# Patient Record
Sex: Female | Born: 1990 | Race: Black or African American | Hispanic: No | Smoking: Never smoker
Health system: Southern US, Community
[De-identification: ages and names within clinical notes are randomized; demographics above are authoritative.]

## PROBLEM LIST (undated history)

## (undated) DIAGNOSIS — F419 Anxiety disorder, unspecified: Secondary | ICD-10-CM

## (undated) DIAGNOSIS — D649 Anemia, unspecified: Secondary | ICD-10-CM

## (undated) DIAGNOSIS — R109 Unspecified abdominal pain: Secondary | ICD-10-CM

## (undated) DIAGNOSIS — E785 Hyperlipidemia, unspecified: Secondary | ICD-10-CM

## (undated) DIAGNOSIS — IMO0002 Reserved for concepts with insufficient information to code with codable children: Secondary | ICD-10-CM

## (undated) DIAGNOSIS — O99345 Other mental disorders complicating the puerperium: Secondary | ICD-10-CM

## (undated) DIAGNOSIS — F53 Postpartum depression: Secondary | ICD-10-CM

## (undated) DIAGNOSIS — Z789 Other specified health status: Secondary | ICD-10-CM

## (undated) DIAGNOSIS — N760 Acute vaginitis: Secondary | ICD-10-CM

## (undated) DIAGNOSIS — B9689 Other specified bacterial agents as the cause of diseases classified elsewhere: Secondary | ICD-10-CM

## (undated) DIAGNOSIS — A64 Unspecified sexually transmitted disease: Secondary | ICD-10-CM

## (undated) HISTORY — DX: Other specified bacterial agents as the cause of diseases classified elsewhere: B96.89

## (undated) HISTORY — DX: Anemia, unspecified: D64.9

## (undated) HISTORY — PX: NO PAST SURGERIES: SHX2092

## (undated) HISTORY — DX: Postpartum depression: F53.0

## (undated) HISTORY — DX: Other mental disorders complicating the puerperium: O99.345

## (undated) HISTORY — DX: Unspecified abdominal pain: R10.9

## (undated) HISTORY — DX: Acute vaginitis: N76.0

## (undated) HISTORY — DX: Other specified health status: Z78.9

## (undated) HISTORY — DX: Reserved for concepts with insufficient information to code with codable children: IMO0002

## (undated) HISTORY — DX: Unspecified sexually transmitted disease: A64

## (undated) HISTORY — DX: Hyperlipidemia, unspecified: E78.5

---

## 2013-11-01 ENCOUNTER — Emergency Department: Payer: Self-pay | Admitting: Emergency Medicine

## 2013-11-01 LAB — CBC
HCT: 33.8 % — AB (ref 35.0–47.0)
HGB: 11.3 g/dL — ABNORMAL LOW (ref 12.0–16.0)
MCH: 28.9 pg (ref 26.0–34.0)
MCHC: 33.3 g/dL (ref 32.0–36.0)
MCV: 87 fL (ref 80–100)
Platelet: 217 10*3/uL (ref 150–440)
RBC: 3.9 10*6/uL (ref 3.80–5.20)
RDW: 12.8 % (ref 11.5–14.5)
WBC: 7.6 10*3/uL (ref 3.6–11.0)

## 2013-11-01 LAB — COMPREHENSIVE METABOLIC PANEL
ALBUMIN: 3.7 g/dL (ref 3.4–5.0)
ALT: 27 U/L (ref 12–78)
Alkaline Phosphatase: 56 U/L
Anion Gap: 4 — ABNORMAL LOW (ref 7–16)
BILIRUBIN TOTAL: 0.4 mg/dL (ref 0.2–1.0)
BUN: 7 mg/dL (ref 7–18)
CALCIUM: 9.1 mg/dL (ref 8.5–10.1)
CREATININE: 0.77 mg/dL (ref 0.60–1.30)
Chloride: 107 mmol/L (ref 98–107)
Co2: 23 mmol/L (ref 21–32)
EGFR (African American): >60
Glucose: 89 mg/dL (ref 65–99)
Osmolality: 266 (ref 275–301)
Potassium: 3.5 mmol/L (ref 3.5–5.1)
SGOT(AST): 26 U/L (ref 15–37)
SODIUM: 134 mmol/L — AB (ref 136–145)
Total Protein: 7.7 g/dL (ref 6.4–8.2)

## 2013-11-01 LAB — URINALYSIS, COMPLETE
BLOOD: NEGATIVE
Bilirubin,UR: NEGATIVE
GLUCOSE, UR: NEGATIVE mg/dL (ref 0–75)
Ketone: NEGATIVE
Leukocyte Esterase: NEGATIVE
Nitrite: NEGATIVE
Ph: 6 (ref 4.5–8.0)
Protein: NEGATIVE
RBC,UR: 8 /HPF (ref 0–5)
SPECIFIC GRAVITY: 1.024 (ref 1.003–1.030)
Squamous Epithelial: 4

## 2013-11-01 LAB — LIPASE, BLOOD: Lipase: 223 U/L (ref 73–393)

## 2013-11-01 LAB — BETA-HYDROXYBUTYRIC ACID: Beta-Hydroxybutyrate: 0.9 mg/dL (ref 0.2–2.8)

## 2013-11-01 LAB — HCG, QUANTITATIVE, PREGNANCY: Beta Hcg, Quant.: 119748 m[IU]/mL — ABNORMAL HIGH

## 2013-11-23 DIAGNOSIS — IMO0002 Reserved for concepts with insufficient information to code with codable children: Secondary | ICD-10-CM

## 2013-11-23 HISTORY — DX: Reserved for concepts with insufficient information to code with codable children: IMO0002

## 2013-11-23 LAB — HM PAP SMEAR: HM PAP: NEGATIVE

## 2014-05-22 ENCOUNTER — Observation Stay: Payer: Self-pay | Admitting: Obstetrics and Gynecology

## 2014-05-22 LAB — CBC WITH DIFFERENTIAL/PLATELET
Basophil #: 0 10*3/uL (ref 0.0–0.1)
Basophil %: 0.5 %
Eosinophil #: 0.2 10*3/uL (ref 0.0–0.7)
Eosinophil %: 2.6 %
HCT: 32.2 % — AB (ref 35.0–47.0)
HGB: 10.3 g/dL — ABNORMAL LOW (ref 12.0–16.0)
LYMPHS ABS: 1.9 10*3/uL (ref 1.0–3.6)
LYMPHS PCT: 24 %
MCH: 26.9 pg (ref 26.0–34.0)
MCHC: 31.9 g/dL — ABNORMAL LOW (ref 32.0–36.0)
MCV: 85 fL (ref 80–100)
MONO ABS: 0.8 x10 3/mm (ref 0.2–0.9)
MONOS PCT: 9.6 %
NEUTROS PCT: 63.3 %
Neutrophil #: 5 10*3/uL (ref 1.4–6.5)
Platelet: 147 10*3/uL — ABNORMAL LOW (ref 150–440)
RBC: 3.81 10*6/uL (ref 3.80–5.20)
RDW: 16.1 % — AB (ref 11.5–14.5)
WBC: 7.9 10*3/uL (ref 3.6–11.0)

## 2014-05-22 LAB — URINALYSIS, COMPLETE
Bilirubin,UR: NEGATIVE
Blood: NEGATIVE
Glucose,UR: NEGATIVE mg/dL (ref 0–75)
Ketone: NEGATIVE
LEUKOCYTE ESTERASE: NEGATIVE
Nitrite: NEGATIVE
Ph: 6 (ref 4.5–8.0)
Protein: NEGATIVE
Specific Gravity: 1.011 (ref 1.003–1.030)
WBC UR: 1 /HPF (ref 0–5)

## 2014-05-31 ENCOUNTER — Inpatient Hospital Stay: Payer: Self-pay | Admitting: Obstetrics and Gynecology

## 2014-05-31 LAB — CBC WITH DIFFERENTIAL/PLATELET
Basophil #: 0 10*3/uL (ref 0.0–0.1)
Basophil %: 0.5 %
EOS ABS: 0.2 10*3/uL (ref 0.0–0.7)
Eosinophil %: 2.7 %
HCT: 36.4 % (ref 35.0–47.0)
HGB: 11.4 g/dL — ABNORMAL LOW (ref 12.0–16.0)
Lymphocyte #: 2.1 10*3/uL (ref 1.0–3.6)
Lymphocyte %: 25.2 %
MCH: 26.8 pg (ref 26.0–34.0)
MCHC: 31.4 g/dL — ABNORMAL LOW (ref 32.0–36.0)
MCV: 85 fL (ref 80–100)
MONO ABS: 0.6 x10 3/mm (ref 0.2–0.9)
MONOS PCT: 7.8 %
Neutrophil #: 5.2 10*3/uL (ref 1.4–6.5)
Neutrophil %: 63.8 %
Platelet: 148 10*3/uL — ABNORMAL LOW (ref 150–440)
RBC: 4.26 10*6/uL (ref 3.80–5.20)
RDW: 16.3 % — AB (ref 11.5–14.5)
WBC: 8.2 10*3/uL (ref 3.6–11.0)

## 2014-06-01 LAB — GC/CHLAMYDIA PROBE AMP

## 2014-06-01 LAB — HEMATOCRIT: HCT: 34.1 % — AB (ref 35.0–47.0)

## 2014-10-02 NOTE — H&P (Signed)
L&D Evaluation:  History:  HPI [age] yoaaf G3P0110, estimated date of confinement 06/05/2013, ega 39.2 weeks admitted in early labor.   Patient's Medical History No Chronic Illness  H/O 24 week fetal loss, s/p 17 OHP Rx.   Patient's Surgical History none   Medications Pre Natal Vitamins  Iron  17 OHP   Allergies NKDA   Social History none   Family History Non-Contributory   ROS:  ROS All systems were reviewed.  HEENT, CNS, GI, GU, Respiratory, CV, Renal and Musculoskeletal systems were found to be normal.   Exam:  Vital Signs stable   Urine Protein not completed   General no apparent distress   Mental Status clear   Heart normal sinus rhythm   Abdomen gravid, non-tender   Estimated Fetal Weight Average for gestational age   Back no CVAT   Edema no edema   Pelvic no external lesions, 4/990/-1/VTX/AROM Clear/IUPC   FHT normal rate with no decels   Skin dry   Lymph no lymphadenopathy   Other O+/ATB-/NR/Rubella NON IMMUNE/VI/HB-/HIV-/GBS-/TSH 0.889/Sickledex-   Impression:  Impression early labor   Plan:  Plan monitor contractions and for cervical change, fluids, Pitocin Augmentation; Epidural PRN   Electronic Signatures: Gaddiel Cullens, Prentice DockerMartin A (MD)  (Signed 07-Jan-16 13:25)  Authored: L&D Evaluation   Last Updated: 07-Jan-16 13:25 by Diyari Cherne, Prentice DockerMartin A (MD)

## 2014-10-02 NOTE — H&P (Signed)
L&D Evaluation:  History:  HPI 38.5 week Intrauterine pregnancy with Lt sided abdominal pain; worse with movement.   Patient's Medical History Preterm delivery history; S/P 17OHP RX   Medications Pre Natal Vitamins   Allergies NKDA   Social History none   Family History Non-Contributory   ROS:  ROS All systems were reviewed.  HEENT, CNS, GI, GU, Respiratory, CV, Renal and Musculoskeletal systems were found to be normal., except for HPI   Exam:  Vital Signs stable  Pulse ox WNL   Urine Protein negative dipstick   General no apparent distress   Mental Status clear   Abdomen Lt sided abdominal wall tenderness; No CVAT   Estimated Fetal Weight Average for gestational age   Back no CVAT   Edema 1+   Pelvic no external lesions, 2/80/-4/VTX   Mebranes Intact   FHT normal rate with no decels   Skin dry   Lymph no lymphadenopathy   Other Irregular contractions   Impression:  Impression 38.5 week Intrauterine pregnancy; Musculoskeletal pain,non acute   Plan:  Plan UA, EFM/NST, monitor contractions and for cervical change   Comments CBC; UA normal   Follow Up Appointment already scheduled   Electronic Signatures: Cameo Shewell, Prentice DockerMartin A (MD)  (Signed 29-Dec-15 07:45)  Authored: L&D Evaluation   Last Updated: 29-Dec-15 07:45 by Myrl Lazarus, Prentice DockerMartin A (MD)

## 2015-01-10 ENCOUNTER — Encounter: Payer: Self-pay | Admitting: Obstetrics and Gynecology

## 2016-02-06 ENCOUNTER — Encounter: Payer: Self-pay | Admitting: Obstetrics and Gynecology

## 2016-04-23 ENCOUNTER — Emergency Department
Admission: EM | Admit: 2016-04-23 | Discharge: 2016-04-23 | Disposition: A | Discharge status: Home / Self Care | Payer: No Typology Code available for payment source | Attending: Emergency Medicine | Admitting: Emergency Medicine

## 2016-04-23 ENCOUNTER — Encounter: Payer: Self-pay | Admitting: *Deleted

## 2016-04-23 DIAGNOSIS — Z3A00 Weeks of gestation of pregnancy not specified: Secondary | ICD-10-CM | POA: Diagnosis not present

## 2016-04-23 DIAGNOSIS — G44319 Acute post-traumatic headache, not intractable: Secondary | ICD-10-CM | POA: Insufficient documentation

## 2016-04-23 DIAGNOSIS — Y999 Unspecified external cause status: Secondary | ICD-10-CM | POA: Diagnosis not present

## 2016-04-23 DIAGNOSIS — Y92410 Unspecified street and highway as the place of occurrence of the external cause: Secondary | ICD-10-CM | POA: Diagnosis not present

## 2016-04-23 DIAGNOSIS — Y9389 Activity, other specified: Secondary | ICD-10-CM | POA: Insufficient documentation

## 2016-04-23 DIAGNOSIS — O9A219 Injury, poisoning and certain other consequences of external causes complicating pregnancy, unspecified trimester: Secondary | ICD-10-CM | POA: Diagnosis not present

## 2016-04-23 MED ORDER — METOCLOPRAMIDE HCL 10 MG PO TABS
5.0000 mg | ORAL_TABLET | Freq: Once | ORAL | Status: AC
  Administered 2016-04-23: 5 mg via ORAL

## 2016-04-23 MED ORDER — METOCLOPRAMIDE HCL 5 MG PO TABS: 5.0000 mg | ORAL_TABLET | Freq: Three times a day (TID) | ORAL | 0 refills | Status: DC | PRN

## 2016-04-23 MED ORDER — ACETAMINOPHEN 325 MG PO TABS
650.0000 mg | ORAL_TABLET | Freq: Once | ORAL | Status: AC
  Administered 2016-04-23: 650 mg via ORAL
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
## 2016-05-07 ENCOUNTER — Encounter: Payer: Self-pay | Admitting: Obstetrics and Gynecology

## 2016-05-07 ENCOUNTER — Ambulatory Visit (INDEPENDENT_AMBULATORY_CARE_PROVIDER_SITE_OTHER): Payer: Medicaid Other | Admitting: Obstetrics and Gynecology

## 2016-05-07 VITALS — BP 101/64 | HR 77 | Ht 67.0 in | Wt 163.5 lb

## 2016-05-07 DIAGNOSIS — A749 Chlamydial infection, unspecified: Secondary | ICD-10-CM

## 2016-05-07 DIAGNOSIS — O98311 Other infections with a predominantly sexual mode of transmission complicating pregnancy, first trimester: Secondary | ICD-10-CM | POA: Diagnosis not present

## 2016-05-07 DIAGNOSIS — O09291 Supervision of pregnancy with other poor reproductive or obstetric history, first trimester: Secondary | ICD-10-CM

## 2016-05-07 DIAGNOSIS — O98811 Other maternal infectious and parasitic diseases complicating pregnancy, first trimester: Secondary | ICD-10-CM

## 2016-05-07 DIAGNOSIS — N912 Amenorrhea, unspecified: Secondary | ICD-10-CM | POA: Diagnosis not present

## 2016-05-07 DIAGNOSIS — R87611 Atypical squamous cells cannot exclude high grade squamous intraepithelial lesion on cytologic smear of cervix (ASC-H): Secondary | ICD-10-CM | POA: Diagnosis not present

## 2016-05-07 DIAGNOSIS — O219 Vomiting of pregnancy, unspecified: Secondary | ICD-10-CM

## 2016-05-07 DIAGNOSIS — Z3687 Encounter for antenatal screening for uncertain dates: Secondary | ICD-10-CM | POA: Diagnosis not present

## 2016-05-07 LAB — POCT URINE PREGNANCY: Preg Test, Ur: POSITIVE — AB

## 2016-05-07 MED ORDER — AZITHROMYCIN 1 G PO PACK: 1.0000 g | PACK | Freq: Once | ORAL | 0 refills | Status: AC

## 2016-05-07 MED ORDER — CONCEPT DHA 53.5-38-1 MG PO CAPS: 53.5000 mg | ORAL_CAPSULE | Freq: Every day | ORAL | 4 refills | Status: DC

## 2016-05-07 MED ORDER — VITAMIN B-6 25 MG PO TABS: 25.0000 mg | ORAL_TABLET | Freq: Three times a day (TID) | ORAL | 1 refills | Status: DC

## 2016-05-07 MED ORDER — DOXYLAMINE SUCCINATE (SLEEP) 25 MG PO TABS: 12.5000 mg | ORAL_TABLET | Freq: Every evening | ORAL | 0 refills | Status: DC | PRN

## 2016-05-08 DIAGNOSIS — O219 Vomiting of pregnancy, unspecified: Secondary | ICD-10-CM | POA: Insufficient documentation

## 2016-05-08 DIAGNOSIS — A749 Chlamydial infection, unspecified: Secondary | ICD-10-CM | POA: Insufficient documentation

## 2016-05-08 DIAGNOSIS — O09291 Supervision of pregnancy with other poor reproductive or obstetric history, first trimester: Secondary | ICD-10-CM | POA: Insufficient documentation

## 2016-05-08 DIAGNOSIS — Z3687 Encounter for antenatal screening for uncertain dates: Secondary | ICD-10-CM | POA: Insufficient documentation

## 2016-05-08 DIAGNOSIS — R87611 Atypical squamous cells cannot exclude high grade squamous intraepithelial lesion on cytologic smear of cervix (ASC-H): Secondary | ICD-10-CM | POA: Insufficient documentation

## 2016-05-08 DIAGNOSIS — O98811 Other maternal infectious and parasitic diseases complicating pregnancy, first trimester: Secondary | ICD-10-CM

## 2016-05-08 NOTE — Progress Notes (Signed)
GYN ENCOUNTER NOTE  Subjective:       Tiffany Bowen is a 25 y.o. [phone] female is here for gynecologic evaluation of the following issues:  1.Pregnancy confirmation 2. Chlamydia  LMP-03/18/2016 (unsure) EDD 12/23/2016 EGA 7.[redacted] weeks gestation  Patient presents with amenorrhea and a positive urine pregnancy test. She has been experiencing mild nausea with vomiting. She is not having any pelvic pain or vaginal bleeding. She is unclear as to the exact date of her last menstrual period. Patient had [facility] which [facility]; both she and her partner require treatment.  Other prenatal risk factors  1. History of 24 week loss; last successful pregnancy treated with 17 OHP  2. ASCUS/H Pap smear  3. History of postpartum depression   Gynecologic History Patient's last menstrual period was 03/18/2016. Contraception: none Last Pap: ASCUS/H 12/20/2015 Last mammogram: N/A  Obstetric History OB History  Gravida Para Term Preterm AB Living  4 2 1 1 1 1   SAB TAB Ectopic Multiple Live Births  1       1    # Outcome Date GA Lbr Len/2nd Weight Sex Delivery Anes PTL Lv  4 Current           3 Term 05/2014   7 lb 8 oz (3.402 kg) M Vag-Spont   LIV  2 Preterm 2014    M Vag-Spont   FD  1 SAB         ND      Past Medical History:  Diagnosis Date  . Abdominal pain   . Anemia   . Breastfeeding (infant)   . BV (bacterial vaginosis)   . 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 Post partum depression   . STD (sexually transmitted disease)     Past Surgical History:  Procedure Laterality Date  . NO PAST SURGERIES      No current outpatient prescriptions on file prior to visit.   No current facility-administered medications on file prior to visit.     No Known Allergies  Social History   Social History  . Marital status: Single    Spouse name: N/A  . Number of children: N/A  . Years of education: N/A    Occupational History  . Not on file.   Social History Main Topics  . Smoking status: Never Smoker  . Smokeless tobacco: Never Used  . Alcohol use No  . Drug use: No  . Sexual activity: Yes    Birth control/ protection: None   Other Topics Concern  . Not on file   Social History Narrative  . No narrative on file    Family History  Problem Relation Age of Onset  . Cancer Neg Hx   . Diabetes Neg Hx   . Heart disease Neg Hx     The following portions of the patient's history were reviewed and updated as appropriate: allergies, current medications, past family history, past medical history, past social history, past surgical history and problem list.  Review of Systems Review of Systems - per history of present illness  Objective:   BP 101/64   Pulse 77   Ht 5\' 7"  (1.702 m)   Wt 163 lb 8 oz (74.2 kg)   LMP 03/18/2016   Breastfeeding? No   BMI 25.61 kg/m  CONSTITUTIONAL: Well-developed, well-nourished female in no acute distress.  Physical exam-deferred   Assessment:   1. Amenorrhea - POCT urine pregnancy  2. Unsure of LMP (last menstrual period) as reason for ultrasound scan - US OB Transvaginal; Future - US OB Comp Less 14 Wks; Future  3. Chlamydia infection affecting pregnancy in first trimester  4. Nausea and vomiting during pregnancy  5. History of postpartum depression   6. History of high-grade dysplasia in 2015; current Pap smear ASCUS/H  7. History of 24 week fetal loss; last successful pregnancy treated with 17 OHP   Plan:   1. Zithromax 1 g by mouth; partner needs treatment 2. Vitamin B6 and doxylamine is prescribed for nausea and vomiting in pregnancy 3. Pelvic ultrasound is ordered because of unsure last menstrual period 4. Patient will need 17 OHP for history of prior 24 week loss 5. Patient will need evaluation for ASCUS/H Pap smear 6. Return in 3 weeks for nursing intake 7. Return in 4 weeks for new OB history and physical  A  total of 25 minutes were spent face-to-face with the patient during this encounter and over half of that time involved counseling and coordination of care.  Herold HarmsMartin A Defrancesco, MD  Note: This dictation was prepared with Dragon dictation along with smaller phrase technology. Any transcriptional errors that result from this process are unintentional.

## 2016-05-08 NOTE — Patient Instructions (Addendum)
1. Zithromax 1 g by mouth; partner needs treatment 2. Vitamin B6 and doxylamine is prescribed for nausea and vomiting in pregnancy 3. Pelvic ultrasound is ordered because of unsure last menstrual period 4. Patient will need 17 OHP for history of prior 24 week loss 5. Patient will need evaluation for ASCUS/H Pap smear 6. Return in 2 weeks for nursing intake 7. Return in 4 weeks for new OB history and physical

## 2016-05-11 ENCOUNTER — Telehealth: Payer: Self-pay | Admitting: Obstetrics and Gynecology

## 2016-05-11 NOTE — Telephone Encounter (Signed)
Pt said Dr Tommi Rumpse gave her an arithamycin power pack? She drank it but threw it up and she wants to know what she needs to do.

## 2016-05-12 NOTE — Telephone Encounter (Signed)
Pls see below and advise

## 2016-05-13 MED ORDER — AZITHROMYCIN 1 G PO PACK: 1.0000 g | PACK | Freq: Once | ORAL | 0 refills | Status: AC

## 2016-05-13 NOTE — Telephone Encounter (Signed)
lmtrc

## 2016-05-13 NOTE — Telephone Encounter (Signed)
Pt aware per mad to resend zithromax powder. Pt aware if she is unable to keep it down to call me back and I will send in tablets.

## 2016-05-13 NOTE — Addendum Note (Signed)
Addended by: Marchelle FolksMILLER, Alva Kuenzel G on: 05/13/2016 01:54 PM   Modules accepted: Orders

## 2016-05-14 ENCOUNTER — Ambulatory Visit (INDEPENDENT_AMBULATORY_CARE_PROVIDER_SITE_OTHER): Payer: Medicaid Other

## 2016-05-14 DIAGNOSIS — Z3687 Encounter for antenatal screening for uncertain dates: Secondary | ICD-10-CM | POA: Diagnosis not present

## 2016-06-04 ENCOUNTER — Encounter: Payer: Medicaid Other | Admitting: Certified Nurse Midwife

## 2017-12-22 ENCOUNTER — Emergency Department: Payer: 59

## 2017-12-22 ENCOUNTER — Emergency Department
Admission: EM | Admit: 2017-12-22 | Discharge: 2017-12-22 | Disposition: A | Discharge status: Home / Self Care | Payer: 59 | Attending: Emergency Medicine | Admitting: Emergency Medicine

## 2017-12-22 ENCOUNTER — Encounter: Payer: Self-pay | Admitting: Emergency Medicine

## 2017-12-22 ENCOUNTER — Other Ambulatory Visit: Payer: Self-pay

## 2017-12-22 DIAGNOSIS — T71194A Asphyxiation due to mechanical threat to breathing due to other causes, undetermined, initial encounter: Secondary | ICD-10-CM | POA: Diagnosis present

## 2017-12-22 DIAGNOSIS — Z79899 Other long term (current) drug therapy: Secondary | ICD-10-CM | POA: Diagnosis not present

## 2017-12-22 LAB — BASIC METABOLIC PANEL
Anion gap: 6 (ref 5–15)
BUN: 10 mg/dL (ref 6–20)
CALCIUM: 9 mg/dL (ref 8.9–10.3)
CO2: 28 mmol/L (ref 22–32)
CREATININE: 0.75 mg/dL (ref 0.44–1.00)
Chloride: 106 mmol/L (ref 98–111)
GFR calc non Af Amer: >60 mL/min (ref >60)
Glucose, Bld: 91 mg/dL (ref 70–99)
Potassium: 3.9 mmol/L (ref 3.5–5.1)
SODIUM: 140 mmol/L (ref 135–145)

## 2017-12-22 LAB — CBC WITH DIFFERENTIAL/PLATELET
BASOS PCT: 1 %
Basophils Absolute: 0 10*3/uL (ref 0–0.1)
EOS ABS: 0.1 10*3/uL (ref 0–0.7)
Eosinophils Relative: 2 %
HCT: 36.1 % (ref 35.0–47.0)
Hemoglobin: 12 g/dL (ref 12.0–16.0)
Lymphocytes Relative: 35 %
Lymphs Abs: 1.6 10*3/uL (ref 1.0–3.6)
MCH: 29.4 pg (ref 26.0–34.0)
MCHC: 33.2 g/dL (ref 32.0–36.0)
MCV: 88.5 fL (ref 80.0–100.0)
MONO ABS: 0.4 10*3/uL (ref 0.2–0.9)
MONOS PCT: 8 %
NEUTROS ABS: 2.5 10*3/uL (ref 1.4–6.5)
NEUTROS PCT: 54 %
Platelets: 213 10*3/uL (ref 150–440)
RBC: 4.08 MIL/uL (ref 3.80–5.20)
RDW: 12.8 % (ref 11.5–14.5)
WBC: 4.6 10*3/uL (ref 3.6–11.0)

## 2017-12-22 LAB — POCT PREGNANCY, URINE: PREG TEST UR: NEGATIVE

## 2017-12-22 MED ORDER — IOPAMIDOL (ISOVUE-370) INJECTION 76%
75.0000 mL | Freq: Once | INTRAVENOUS | Status: AC | PRN
  Administered 2017-12-22: 75 mL via INTRAVENOUS
  Filled 2017-12-22: qty 75

## 2017-12-22 MED ORDER — IBUPROFEN 600 MG PO TABS
ORAL_TABLET | ORAL | Status: AC
  Administered 2017-12-22: 600 mg via ORAL
  Filled 2017-12-22: qty 1

## 2017-12-22 MED ORDER — IBUPROFEN 400 MG PO TABS
600.0000 mg | ORAL_TABLET | Freq: Once | ORAL | Status: AC
  Administered 2017-12-22: 600 mg via ORAL
  Filled 2017-12-22: qty 2
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
## 2017-12-22 NOTE — ED Notes (Signed)
MD at bedside with patient and mother to explain test results.
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
## 2018-01-26 ENCOUNTER — Encounter: Payer: Self-pay | Admitting: Obstetrics and Gynecology

## 2018-01-26 ENCOUNTER — Other Ambulatory Visit (HOSPITAL_COMMUNITY)
Admission: RE | Admit: 2018-01-26 | Discharge: 2018-01-26 | Disposition: A | Discharge status: Home / Self Care | Payer: 59 | Source: Ambulatory Visit | Attending: Obstetrics and Gynecology | Admitting: Obstetrics and Gynecology

## 2018-01-26 ENCOUNTER — Ambulatory Visit (INDEPENDENT_AMBULATORY_CARE_PROVIDER_SITE_OTHER): Payer: 59 | Admitting: Obstetrics and Gynecology

## 2018-01-26 VITALS — BP 107/74 | HR 75 | Ht 66.0 in | Wt 156.6 lb

## 2018-01-26 DIAGNOSIS — Z202 Contact with and (suspected) exposure to infections with a predominantly sexual mode of transmission: Secondary | ICD-10-CM | POA: Diagnosis not present

## 2018-01-26 DIAGNOSIS — Z01419 Encounter for gynecological examination (general) (routine) without abnormal findings: Secondary | ICD-10-CM

## 2018-01-26 DIAGNOSIS — N939 Abnormal uterine and vaginal bleeding, unspecified: Secondary | ICD-10-CM

## 2018-01-26 DIAGNOSIS — N898 Other specified noninflammatory disorders of vagina: Secondary | ICD-10-CM | POA: Diagnosis not present

## 2018-01-26 LAB — POCT URINE PREGNANCY: Preg Test, Ur: NEGATIVE

## 2018-01-26 NOTE — Progress Notes (Signed)
ANNUAL PREVENTATIVE CARE GYN  ENCOUNTER NOTE  Subjective:       Tiffany Bowen is a 27 y.o. [phone] female here for a routine annual gynecologic exam.  Current complaints: 1.  Irregular cycles; bleeding is monthly but varies from being light to heavy; she has no intermenstrual bleeding.  2. Discuss BTL; patient uncertain about future pregnancy; compliance with daily medication is lacking per patient's history; she is contemplating IUD 3.  Desires STD screening; patient is concerned about her partner and has had increased discharge recently; no vaginal burning, vaginal pain, painful sex. 4.  Patient has history of ASCUS/H Pap smear, never fully evaluated.   Gynecologic History Patient's last menstrual period was 01/10/2018 (exact date). Contraception: none Last Pap: 11/2015 at [facility] ascus cannot exclude high grade. Unsure if had colpo.  Results were: abn Last mammogram: n/a   Obstetric History OB History  Gravida Para Term Preterm AB Living  4 2 1 1 2 1   SAB TAB Ectopic Multiple Live Births  1 1     1     # Outcome Date GA Lbr Len/2nd Weight Sex Delivery Anes PTL Lv  4 TAB 2017          3 Term 05/2014   7 lb 8 oz (3.402 kg) M Vag-Spont   LIV  2 Preterm 2014    M Vag-Spont   FD  1 SAB 2012        ND    Past Medical History:  Diagnosis Date  . Abdominal pain   . Anemia   . Breastfeeding (infant)   . BV (bacterial vaginosis)   . 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 Post partum depression   . STD (sexually transmitted disease)     Past Surgical History:  Procedure Laterality Date  . NO PAST SURGERIES      No current outpatient medications on file prior to visit.   No current facility-administered medications on file prior to visit.     No Known Allergies  Social History   Socioeconomic History  . Marital status: Single    Spouse name: Not on file  . Number of children: Not on file  . Years of education: Not on file   . Highest education level: Not on file  Occupational History  . Not on file  Social Needs  . Financial resource strain: Not on file  . Food insecurity:    Worry: Not on file    Inability: Not on file  . Transportation needs:    Medical: Not on file    Non-medical: Not on file  Tobacco Use  . Smoking status: Never Smoker  . Smokeless tobacco: Never Used  Substance and Sexual Activity  . Alcohol use: No  . Drug use: No  . Sexual activity: Yes    Birth control/protection: None  Lifestyle  . Physical activity:    Days per week: Not on file    Minutes per session: Not on file  . Stress: Not on file  Relationships  . Social connections:    Talks on phone: Not on file    Gets together: Not on file    Attends religious service: Not on file    Active member of club or organization: Not on file    Attends meetings of clubs or organizations: Not on file    Relationship status: Not on file  . Intimate partner violence:    Fear of current or  ex partner: Not on file    Emotionally abused: Not on file    Physically abused: Not on file    Forced sexual activity: Not on file  Other Topics Concern  . Not on file  Social History Narrative  . Not on file    Family History  Problem Relation Age of Onset  . Cancer Neg Hx   . Diabetes Neg Hx   . Heart disease Neg Hx     The following portions of the patient's history were reviewed and updated as appropriate: allergies, current medications, past family history, past medical history, past social history, past surgical history and problem list.  Review of Systems Review of Systems  Constitutional: Positive for fever. Negative for chills and diaphoresis.  HENT: Negative.   Eyes: Negative.   Respiratory: Negative.   Cardiovascular: Negative.   Gastrointestinal: Negative.   Genitourinary:       Irregular menstrual cycles  Musculoskeletal: Negative.   Skin: Negative.   Neurological: Negative.   Endo/Heme/Allergies: Negative.    Psychiatric/Behavioral: Negative.     Objective:   BP 107/74   Pulse 75   Ht 5\' 6"  (1.676 m)   Wt 156 lb 9.6 oz (71 kg)   LMP 01/10/2018 (Exact Date)   Breastfeeding? No   BMI 25.28 kg/m  CONSTITUTIONAL: Well-developed, well-nourished female in no acute distress.  PSYCHIATRIC: Normal mood and affect. Normal behavior. Normal judgment and thought content. NEUROLGIC: Alert and oriented to person, place, and time. Normal muscle tone coordination. No cranial nerve deficit noted. HENT:  Normocephalic, atraumatic, External right and left ear normal.  EYES: Conjunctivae and EOM are normal. No scleral icterus.  NECK: Normal range of motion, supple, no masses.  Normal thyroid.  SKIN: Skin is warm and dry. No rash noted. Not diaphoretic. No erythema. No pallor. CARDIOVASCULAR: Normal heart rate noted, regular rhythm, no murmur. RESPIRATORY: Clear to auscultation bilaterally. Effort and breath sounds normal, no problems with respiration noted. BREASTS: Symmetric in size. No masses, skin changes, nipple drainage, or lymphadenopathy. ABDOMEN: Soft, normal bowel sounds, no distention noted.  No tenderness, rebound or guarding.  BLADDER: Normal PELVIC:  External Genitalia: Normal  BUS: Normal  Vagina: Normal estrogen effect; minimal white frothy secretions  Cervix: Normal; no discharge; no cervical motion tenderness  Uterus: Normal; anteverted, mobile, top normal size and shape  Adnexa: Normal; nonpalpable nontender  RV: External Exam NormaI  MUSCULOSKELETAL: Normal range of motion. No tenderness.  No cyanosis, clubbing, or edema.  2+ distal pulses. LYMPHATIC: No Axillary, Supraclavicular, or Inguinal Adenopathy.  PROCEDURE: Wet prep Normal saline-moderate white blood cells; no clue cells; no trichomonas KOH-no yeast  Assessment:   Annual gynecologic examination 27 y.o. Contraception: abstinence Normal BMI STD exposure Vaginal discharge; wet prep consistent with leukorrhea History of  ASCUS H Pap smear  Plan:  Pap: Pap, Reflex if ASCUS Mammogram: Not Indicated Stool Guaiac Testing:  Not Indicated Labs: thru employer STD testing is performed Routine preventative health maintenance measures emphasized: Exercise/Diet/Weight control, Tobacco Warnings, Alcohol/Substance use risks and Safe Sex Return in 2 weeks for Mirena IUD insertion Follow-up on Pap smear results, colposcopy possibly needed Return to Clinic - 1 Year   Crystal Luray, CMA  Herold Harms, MD  Note: This dictation was prepared with Dragon dictation along with smaller phrase technology. Any transcriptional errors that result from this process are unintentional.

## 2018-01-26 NOTE — Patient Instructions (Addendum)
1.  Pap smear is done. 2.  Self breast awareness is encouraged. 3.  Screening labs are to be obtained through employer.  STD screening is done today 4.  Continue with healthy eating and exercise. 5.  Contraception-return in 2 weeks for Mirena IUD insertion; strongly encouraged condom use 6.  Return in 1 year for annual exam 7.  Wet prep today did not reveal any significant vaginitis findings  Levonorgestrel intrauterine device (IUD) What is this medicine? LEVONORGESTREL IUD (LEE voe nor jes trel) is a contraceptive (birth control) device. The device is placed inside the uterus by a healthcare professional. It is used to prevent pregnancy. This device can also be used to treat heavy bleeding that occurs during your period. This medicine may be used for other purposes; ask your health care provider or pharmacist if you have questions. COMMON BRAND NAME(S): Minette Headland What should I tell my health care provider before I take this medicine? They need to know if you have any of these conditions: -abnormal Pap smear -cancer of the breast, uterus, or cervix -diabetes -endometritis -genital or pelvic infection now or in the past -have more than one sexual partner or your partner has more than one partner -heart disease -history of an ectopic or tubal pregnancy -immune system problems -IUD in place -liver disease or tumor -problems with blood clots or take blood-thinners -seizures -use intravenous drugs -uterus of unusual shape -vaginal bleeding that has not been explained -an unusual or allergic reaction to levonorgestrel, other hormones, silicone, or polyethylene, medicines, foods, dyes, or preservatives -pregnant or trying to get pregnant -breast-feeding How should I use this medicine? This device is placed inside the uterus by a health care professional. Talk to your pediatrician regarding the use of this medicine in children. Special care may be  needed. Overdosage: If you think you have taken too much of this medicine contact a poison control center or emergency room at once. NOTE: This medicine is only for you. Do not share this medicine with others. What if I miss a dose? This does not apply. Depending on the brand of device you have inserted, the device will need to be replaced every 3 to 5 years if you wish to continue using this type of birth control. What may interact with this medicine? Do not take this medicine with any of the following medications: -amprenavir -bosentan -fosamprenavir This medicine may also interact with the following medications: -aprepitant -armodafinil -barbiturate medicines for inducing sleep or treating seizures -bexarotene -boceprevir -griseofulvin -medicines to treat seizures like carbamazepine, ethotoin, felbamate, oxcarbazepine, phenytoin, topiramate -modafinil -pioglitazone -rifabutin -rifampin -rifapentine -some medicines to treat HIV infection like atazanavir, efavirenz, indinavir, lopinavir, nelfinavir, tipranavir, ritonavir -St. John's wort -warfarin This list may not describe all possible interactions. Give your health care provider a list of all the medicines, herbs, non-prescription drugs, or dietary supplements you use. Also tell them if you smoke, drink alcohol, or use illegal drugs. Some items may interact with your medicine. What should I watch for while using this medicine? Visit your doctor or health care professional for regular check ups. See your doctor if you or your partner has sexual contact with others, becomes HIV positive, or gets a sexual transmitted disease. This product does not protect you against HIV infection (AIDS) or other sexually transmitted diseases. You can check the placement of the IUD yourself by reaching up to the top of your vagina with clean fingers to feel the threads. Do not pull on the threads.  It is a good habit to check placement after each  menstrual period. Call your doctor right away if you feel more of the IUD than just the threads or if you cannot feel the threads at all. The IUD may come out by itself. You may become pregnant if the device comes out. If you notice that the IUD has come out use a backup birth control method like condoms and call your health care provider. Using tampons will not change the position of the IUD and are okay to use during your period. This IUD can be safely scanned with magnetic resonance imaging (MRI) only under specific conditions. Before you have an MRI, tell your healthcare provider that you have an IUD in place, and which type of IUD you have in place. What side effects may I notice from receiving this medicine? Side effects that you should report to your doctor or health care professional as soon as possible: -allergic reactions like skin rash, itching or hives, swelling of the face, lips, or tongue -fever, flu-like symptoms -genital sores -high blood pressure -no menstrual period for 6 weeks during use -pain, swelling, warmth in the leg -pelvic pain or tenderness -severe or sudden headache -signs of pregnancy -stomach cramping -sudden shortness of breath -trouble with balance, talking, or walking -unusual vaginal bleeding, discharge -yellowing of the eyes or skin Side effects that usually do not require medical attention (report to your doctor or health care professional if they continue or are bothersome): -acne -breast pain -change in sex drive or performance -changes in weight -cramping, dizziness, or faintness while the device is being inserted -headache -irregular menstrual bleeding within first 3 to 6 months of use -nausea This list may not describe all possible side effects. Call your doctor for medical advice about side effects. You may report side effects to FDA at 1-800-FDA-1088. Where should I keep my medicine? This does not apply. NOTE: This sheet is a summary. It may  not cover all possible information. If you have questions about this medicine, talk to your doctor, pharmacist, or health care provider.  2018 Elsevier/Gold Standard (2016-02-21 14:14:56)   Health Maintenance, Female Adopting a healthy lifestyle and getting preventive care can go a long way to promote health and wellness. Talk with your health care provider about what schedule of regular examinations is right for you. This is a good chance for you to check in with your provider about disease prevention and staying healthy. In between checkups, there are plenty of things you can do on your own. Experts have done a lot of research about which lifestyle changes and preventive measures are most likely to keep you healthy. Ask your health care provider for more information. Weight and diet Eat a healthy diet  Be sure to include plenty of vegetables, fruits, low-fat dairy products, and lean protein.  Do not eat a lot of foods high in solid fats, added sugars, or salt.  Get regular exercise. This is one of the most important things you can do for your health. ? Most adults should exercise for at least 150 minutes each week. The exercise should increase your heart rate and make you sweat (moderate-intensity exercise). ? Most adults should also do strengthening exercises at least twice a week. This is in addition to the moderate-intensity exercise.  Maintain a healthy weight  Body mass index (BMI) is a measurement that can be used to identify possible weight problems. It estimates body fat based on height and weight. Your health care  provider can help determine your BMI and help you achieve or maintain a healthy weight.  For females 13 years of age and older: ? A BMI below 18.5 is considered underweight. ? A BMI of 18.5 to 24.9 is normal. ? A BMI of 25 to 29.9 is considered overweight. ? A BMI of 30 and above is considered obese.  Watch levels of cholesterol and blood lipids  You should start  having your blood tested for lipids and cholesterol at 29 years of age, then have this test every 5 years.  You may need to have your cholesterol levels checked more often if: ? Your lipid or cholesterol levels are high. ? You are older than 29 years of age. ? You are at high risk for heart disease.  Cancer screening Lung Cancer  Lung cancer screening is recommended for adults 17-12 years old who are at high risk for lung cancer because of a history of smoking.  A yearly low-dose CT scan of the lungs is recommended for people who: ? Currently smoke. ? Have quit within the past 15 years. ? Have at least a 30-pack-year history of smoking. A pack year is smoking an average of one pack of cigarettes a day for 1 year.  Yearly screening should continue until it has been 15 years since you quit.  Yearly screening should stop if you develop a health problem that would prevent you from having lung cancer treatment.  Breast Cancer  Practice breast self-awareness. This means understanding how your breasts normally appear and feel.  It also means doing regular breast self-exams. Let your health care provider know about any changes, no matter how small.  If you are in your 20s or 30s, you should have a clinical breast exam (CBE) by a health care provider every 1-3 years as part of a regular health exam.  If you are 45 or older, have a CBE every year. Also consider having a breast X-ray (mammogram) every year.  If you have a family history of breast cancer, talk to your health care provider about genetic screening.  If you are at high risk for breast cancer, talk to your health care provider about having an MRI and a mammogram every year.  Breast cancer gene (BRCA) assessment is recommended for women who have family members with BRCA-related cancers. BRCA-related cancers include: ? Breast. ? Ovarian. ? Tubal. ? Peritoneal cancers.  Results of the assessment will determine the need for  genetic counseling and BRCA1 and BRCA2 testing.  Cervical Cancer Your health care provider may recommend that you be screened regularly for cancer of the pelvic organs (ovaries, uterus, and vagina). This screening involves a pelvic examination, including checking for microscopic changes to the surface of your cervix (Pap test). You may be encouraged to have this screening done every 3 years, beginning at age 33.  For women ages 12-65, health care providers may recommend pelvic exams and Pap testing every 3 years, or they may recommend the Pap and pelvic exam, combined with testing for human papilloma virus (HPV), every 5 years. Some types of HPV increase your risk of cervical cancer. Testing for HPV may also be done on women of any age with unclear Pap test results.  Other health care providers may not recommend any screening for nonpregnant women who are considered low risk for pelvic cancer and who do not have symptoms. Ask your health care provider if a screening pelvic exam is right for you.  If you have had  past treatment for cervical cancer or a condition that could lead to cancer, you need Pap tests and screening for cancer for at least 20 years after your treatment. If Pap tests have been discontinued, your risk factors (such as having a new sexual partner) need to be reassessed to determine if screening should resume. Some women have medical problems that increase the chance of getting cervical cancer. In these cases, your health care provider may recommend more frequent screening and Pap tests.  Colorectal Cancer  This type of cancer can be detected and often prevented.  Routine colorectal cancer screening usually begins at 29 years of age and continues through 29 years of age.  Your health care provider may recommend screening at an earlier age if you have risk factors for colon cancer.  Your health care provider may also recommend using home test kits to check for hidden blood in the  stool.  A small camera at the end of a tube can be used to examine your colon directly (sigmoidoscopy or colonoscopy). This is done to check for the earliest forms of colorectal cancer.  Routine screening usually begins at age 28.  Direct examination of the colon should be repeated every 5-10 years through 29 years of age. However, you may need to be screened more often if early forms of precancerous polyps or small growths are found.  Skin Cancer  Check your skin from head to toe regularly.  Tell your health care provider about any new moles or changes in moles, especially if there is a change in a mole's shape or color.  Also tell your health care provider if you have a mole that is larger than the size of a pencil eraser.  Always use sunscreen. Apply sunscreen liberally and repeatedly throughout the day.  Protect yourself by wearing long sleeves, pants, a wide-brimmed hat, and sunglasses whenever you are outside.  Heart disease, diabetes, and high blood pressure  High blood pressure causes heart disease and increases the risk of stroke. High blood pressure is more likely to develop in: ? People who have blood pressure in the high end of the normal range (130-139/85-89 mm Hg). ? People who are overweight or obese. ? People who are African American.  If you are 20-12 years of age, have your blood pressure checked every 3-5 years. If you are 49 years of age or older, have your blood pressure checked every year. You should have your blood pressure measured twice-once when you are at a hospital or clinic, and once when you are not at a hospital or clinic. Record the average of the two measurements. To check your blood pressure when you are not at a hospital or clinic, you can use: ? An automated blood pressure machine at a pharmacy. ? A home blood pressure monitor.  If you are between 23 years and 60 years old, ask your health care provider if you should take aspirin to prevent  strokes.  Have regular diabetes screenings. This involves taking a blood sample to check your fasting blood sugar level. ? If you are at a normal weight and have a low risk for diabetes, have this test once every three years after 29 years of age. ? If you are overweight and have a high risk for diabetes, consider being tested at a younger age or more often. Preventing infection Hepatitis B  If you have a higher risk for hepatitis B, you should be screened for this virus. You are considered at high  risk for hepatitis B if: ? You were born in a country where hepatitis B is common. Ask your health care provider which countries are considered high risk. ? Your parents were born in a high-risk country, and you have not been immunized against hepatitis B (hepatitis B vaccine). ? You have HIV or AIDS. ? You use needles to inject street drugs. ? You live with someone who has hepatitis B. ? You have had sex with someone who has hepatitis B. ? You get hemodialysis treatment. ? You take certain medicines for conditions, including cancer, organ transplantation, and autoimmune conditions.  Hepatitis C  Blood testing is recommended for: ? Everyone born from [birth year] through 1967. ? Anyone with known risk factors for hepatitis C.  Sexually transmitted infections (STIs)  You should be screened for sexually transmitted infections (STIs) including gonorrhea and chlamydia if: ? You are sexually active and are younger than 29 years of age. ? You are older than 29 years of age and your health care provider tells you that you are at risk for this type of infection. ? Your sexual activity has changed since you were last screened and you are at an increased risk for chlamydia or gonorrhea. Ask your health care provider if you are at risk.  If you do not have HIV, but are at risk, it may be recommended that you take a prescription medicine daily to prevent HIV infection. This is called pre-exposure prophylaxis  (PrEP). You are considered at risk if: ? You are sexually active and do not regularly use condoms or know the HIV status of your partner(s). ? You take drugs by injection. ? You are sexually active with a partner who has HIV.  Talk with your health care provider about whether you are at high risk of being infected with HIV. If you choose to begin PrEP, you should first be tested for HIV. You should then be tested every 3 months for as long as you are taking PrEP. Pregnancy  If you are premenopausal and you may become pregnant, ask your health care provider about preconception counseling.  If you may become pregnant, take 400 to 800 micrograms (mcg) of folic acid every day.  If you want to prevent pregnancy, talk to your health care provider about birth control (contraception). Osteoporosis and menopause  Osteoporosis is a disease in which the bones lose minerals and strength with aging. This can result in serious bone fractures. Your risk for osteoporosis can be identified using a bone density scan.  If you are 66 years of age or older, or if you are at risk for osteoporosis and fractures, ask your health care provider if you should be screened.  Ask your health care provider whether you should take a calcium or vitamin D supplement to lower your risk for osteoporosis.  Menopause may have certain physical symptoms and risks.  Hormone replacement therapy may reduce some of these symptoms and risks. Talk to your health care provider about whether hormone replacement therapy is right for you. Follow these instructions at home:  Schedule regular health, dental, and eye exams.  Stay current with your immunizations.  Do not use any tobacco products including cigarettes, chewing tobacco, or electronic cigarettes.  If you are pregnant, do not drink alcohol.  If you are breastfeeding, limit how much and how often you drink alcohol.  Limit alcohol intake to no more than 1 drink per day for  nonpregnant women. One drink equals 12 ounces of beer, 5 ounces  of wine, or 1 ounces of hard liquor.  Do not use street drugs.  Do not share needles.  Ask your health care provider for help if you need support or information about quitting drugs.  Tell your health care provider if you often feel depressed.  Tell your health care provider if you have ever been abused or do not feel safe at home. This information is not intended to replace advice given to you by your health care provider. Make sure you discuss any questions you have with your health care provider. Document Released: 11/24/2010 Document Revised: 10/17/2015 Document Reviewed: 02/12/2015 Elsevier Interactive Patient Education  [employer].

## 2018-01-27 ENCOUNTER — Telehealth: Payer: Self-pay | Admitting: Obstetrics and Gynecology

## 2018-01-27 LAB — RPR: RPR Ser Ql: NONREACTIVE

## 2018-01-27 LAB — HEPATITIS B SURFACE ANTIGEN: Hepatitis B Surface Ag: NEGATIVE

## 2018-01-27 LAB — HEPATITIS C ANTIBODY: Hep C Virus Ab: <0.1 s/co ratio (ref 0.0–0.9)

## 2018-01-27 LAB — HSV(HERPES SIMPLEX VRS) I + II AB-IGG
HSV 1 Glycoprotein G Ab, IgG: 39.9 index — ABNORMAL HIGH (ref 0.00–0.90)
HSV 2 IgG, Type Spec: <0.91 index (ref 0.00–0.90)

## 2018-01-27 LAB — HIV ANTIBODY (ROUTINE TESTING W REFLEX): HIV Screen 4th Generation wRfx: NONREACTIVE

## 2018-01-27 NOTE — Telephone Encounter (Signed)
See lab results.  

## 2018-01-27 NOTE — Telephone Encounter (Signed)
The patient called and stated that she missed a call from [facility] and would like a call back if possible. No other information was disclosed. Please advise.

## 2018-01-31 LAB — CYTOLOGY - PAP
Chlamydia: NEGATIVE
DIAGNOSIS: UNDETERMINED — AB
HPV: DETECTED — AB
NEISSERIA GONORRHEA: NEGATIVE
Trichomonas: POSITIVE — AB

## 2018-02-02 ENCOUNTER — Telehealth: Payer: Self-pay

## 2018-02-02 MED ORDER — METRONIDAZOLE 500 MG PO TABS: 500.0000 mg | ORAL_TABLET | Freq: Two times a day (BID) | ORAL | 0 refills | Status: DC

## 2018-02-02 NOTE — Telephone Encounter (Signed)
-----   Message from Herold Harms, MD sent at 02/01/2018  8:14 AM EDT ----- Please notify - Abnormal Labs Trichomonas vaginalis is present; both she and partner need treatment; please call in Flagyl 500 mg twice a day x7 days and reinforce that partner needs treated. Please schedule colposcopy appointment in 6 weeks for evaluation of ASCUS/positive Pap smear.  This should be done at least a month after Trichomonas vaginalis is cleared.

## 2018-02-09 ENCOUNTER — Ambulatory Visit (INDEPENDENT_AMBULATORY_CARE_PROVIDER_SITE_OTHER): Payer: 59 | Admitting: Obstetrics and Gynecology

## 2018-02-09 ENCOUNTER — Encounter: Payer: Self-pay | Admitting: Obstetrics and Gynecology

## 2018-02-09 VITALS — BP 169/69 | HR 75 | Ht 66.0 in | Wt 157.6 lb

## 2018-02-09 DIAGNOSIS — Z30430 Encounter for insertion of intrauterine contraceptive device: Secondary | ICD-10-CM | POA: Diagnosis not present

## 2018-02-09 DIAGNOSIS — R87610 Atypical squamous cells of undetermined significance on cytologic smear of cervix (ASC-US): Secondary | ICD-10-CM

## 2018-02-09 DIAGNOSIS — R87810 Cervical high risk human papillomavirus (HPV) DNA test positive: Secondary | ICD-10-CM

## 2018-02-09 DIAGNOSIS — A599 Trichomoniasis, unspecified: Secondary | ICD-10-CM

## 2018-02-09 LAB — POCT URINE PREGNANCY: Preg Test, Ur: NEGATIVE

## 2018-02-09 NOTE — Patient Instructions (Signed)
1.  IUD is not inserted today. 2.  Recent treatment for Trichomonas vaginalis has been completed 3.  Colposcopy is scheduled for 03/30/2018 for ASCUS/positive Pap smear.  IUD insertion is to be delayed until colposcopy evaluation is complete. 4.  IUD will likely be inserted in mid November 2019

## 2018-02-10 NOTE — Progress Notes (Signed)
Chief complaint: 1.  IUD insertion 2.  Recent trichomonas infection 3.  History of ASCUS/positive Pap smear  Irish Lackenisha presents today for Mirena IUD insertion.  She is currently on her menses. Recent findings included ASCUS/positive Pap/HPV testing, as well as Trichomonas vaginalis infection which was treated.  Because the patient is needing colposcopy evaluation in the near future, and we are waiting for resolution of potential inflammatory changes from recent Trichomonas vaginalis infection, the decision has been made mutually to delay insertion of the Mirena IUD.  This will facilitate optimal colposcopy evaluation with biopsies if needed to assess her abnormal Pap smear, and will allow for resolution of any inflammation associated with a trichomonas infection following treatment.  Impression: 1.  Mirena IUD-not inserted today, deferred till mid November 2.  Recent Trichomonas vaginalis infection treated 3.  ASCUS/positive Pap smear/HPV, needing colposcopy  PLAN: 1.  Colposcopy with biopsies to be performed in first week of November 2.  Patient is to return in second or third week in November for probable Mirena IUD insertion 3.  In the usual, condoms will be used for contraception.  Herold HarmsMartin A Addisynn Vassell, MD  Note: This dictation was prepared with Dragon dictation along with smaller phrase technology. Any transcriptional errors that result from this process are unintentional.

## 2018-02-17 ENCOUNTER — Encounter: Payer: Self-pay | Admitting: Emergency Medicine

## 2018-02-17 ENCOUNTER — Other Ambulatory Visit: Payer: Self-pay

## 2018-02-17 ENCOUNTER — Emergency Department
Admission: EM | Admit: 2018-02-17 | Discharge: 2018-02-17 | Disposition: A | Discharge status: Home / Self Care | Payer: 59 | Attending: Emergency Medicine | Admitting: Emergency Medicine

## 2018-02-17 DIAGNOSIS — S199XXA Unspecified injury of neck, initial encounter: Secondary | ICD-10-CM

## 2018-02-17 DIAGNOSIS — M7918 Myalgia, other site: Secondary | ICD-10-CM

## 2018-02-17 DIAGNOSIS — R51 Headache: Secondary | ICD-10-CM | POA: Insufficient documentation

## 2018-02-17 DIAGNOSIS — M791 Myalgia, unspecified site: Secondary | ICD-10-CM | POA: Insufficient documentation

## 2018-02-17 DIAGNOSIS — M542 Cervicalgia: Secondary | ICD-10-CM | POA: Insufficient documentation

## 2018-02-17 DIAGNOSIS — S0990XA Unspecified injury of head, initial encounter: Secondary | ICD-10-CM

## 2018-02-17 MED ORDER — IBUPROFEN 600 MG PO TABS
600.0000 mg | ORAL_TABLET | Freq: Once | ORAL | Status: AC
  Administered 2018-02-17: 600 mg via ORAL
  Filled 2018-02-17: qty 1

## 2018-02-17 MED ORDER — CYCLOBENZAPRINE HCL 10 MG PO TABS: 10.0000 mg | ORAL_TABLET | Freq: Three times a day (TID) | ORAL | 0 refills | Status: DC | PRN

## 2018-02-17 MED ORDER — CYCLOBENZAPRINE HCL 10 MG PO TABS
10.0000 mg | ORAL_TABLET | Freq: Once | ORAL | Status: AC
  Administered 2018-02-17: 10 mg via ORAL
  Filled 2018-02-17: qty 1

## 2018-02-17 MED ORDER — TRAMADOL HCL 50 MG PO TABS: 50.0000 mg | ORAL_TABLET | Freq: Two times a day (BID) | ORAL | 0 refills | Status: DC | PRN

## 2018-02-17 MED ORDER — IBUPROFEN 800 MG PO TABS: 800.0000 mg | ORAL_TABLET | Freq: Three times a day (TID) | ORAL | 0 refills | Status: DC | PRN

## 2018-02-17 MED ORDER — TRAMADOL HCL 50 MG PO TABS
50.0000 mg | ORAL_TABLET | Freq: Once | ORAL | Status: AC
  Administered 2018-02-17: 50 mg via ORAL
  Filled 2018-02-17: qty 1
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
## 2018-03-30 ENCOUNTER — Ambulatory Visit (INDEPENDENT_AMBULATORY_CARE_PROVIDER_SITE_OTHER): Payer: 59 | Admitting: Obstetrics and Gynecology

## 2018-03-30 ENCOUNTER — Encounter: Payer: Self-pay | Admitting: Obstetrics and Gynecology

## 2018-03-30 ENCOUNTER — Other Ambulatory Visit (HOSPITAL_COMMUNITY)
Admission: RE | Admit: 2018-03-30 | Discharge: 2018-03-30 | Disposition: A | Discharge status: Home / Self Care | Payer: 59 | Source: Ambulatory Visit | Attending: Obstetrics and Gynecology | Admitting: Obstetrics and Gynecology

## 2018-03-30 VITALS — BP 111/67 | HR 72 | Ht 66.0 in | Wt 151.3 lb

## 2018-03-30 DIAGNOSIS — R87620 Atypical squamous cells of undetermined significance on cytologic smear of vagina (ASC-US): Secondary | ICD-10-CM | POA: Insufficient documentation

## 2018-03-30 DIAGNOSIS — R87811 Vaginal high risk human papillomavirus (HPV) DNA test positive: Secondary | ICD-10-CM

## 2018-03-30 LAB — POCT URINE PREGNANCY: Preg Test, Ur: NEGATIVE

## 2018-03-30 NOTE — Patient Instructions (Signed)
1.  Colposcopy with biopsies are performed today. 2.  Return on 04/13/2018 for Mirena IUD insertion and review of biopsies 3.  Abstain from intercourse over the next 2 weeks   Cervical Biopsy, Care After Refer to this sheet in the next few weeks. These instructions provide you with information about caring for yourself after your procedure. Your health care provider may also give you more specific instructions. Your treatment has been planned according to current medical practices, but problems sometimes occur. Call your health care provider if you have any problems or questions after your procedure. What can I expect after the procedure? After the procedure, it is common to have:  Cramping or mild pain for a few days.  Slight bleeding from the vagina for a few days.  Dark-colored vaginal discharge for a few days.  Follow these instructions at home:  Take over-the-counter and prescription medicines only as told by your health care provider.  Return to your normal activities as told by your health care provider. Ask your health care provider what activities are safe for you.  Use a sanitary napkin until bleeding and discharge stop.  Do not use tampons until your health care provider approves.  Do not douche until your health care provider approves.  Do not have sex until your health care provider approves.  Keep all follow-up visits as told by your health care provider. This is important. Contact a health care provider if:  You have a fever or chills.  You have bad-smelling vaginal discharge.  You have itching or irritation around the vagina.  You have lower abdominal pain. Get help right away if:  You develop heavy vaginal bleeding that soaks more than one sanitary pad an hour.  You faint.  You have very bad lower abdominal pain. This information is not intended to replace advice given to you by your health care provider. Make sure you discuss any questions you have  with your health care provider. Document Released: 01/30/2015 Document Revised: 10/17/2015 Document Reviewed: 09/26/2014 Elsevier Interactive Patient Education  2018 [employer].

## 2018-03-30 NOTE — Progress Notes (Signed)
Chief complaint: 1.  ASCUS/positive Pap smear 2.  History of Trichomonas vaginalis infection, treated 3.  Colposcopy  STD history-trichomonas, treated; chlamydia, treated (during pregnancy Tobacco use history: Negative Contraception: None at present  Abnormal Pap smear history: 01/26/2018 Pap/HPV ASCUS/positive; Trichomonas vaginalis present  OBJECTIVE: BP 111/67   Pulse 72   Ht 5\' 6"  (1.676 m)   Wt 151 lb 4.8 oz (68.6 kg)   LMP 03/12/2018 (Exact Date)   BMI 24.42 kg/m  Pleasant well-appearing female no acute distress.  Alert and oriented. Pelvic: External genitalia-normal BUS-normal Vagina-normal; no discharge Cervix-parous; no lesions; no discharge  PROCEDURE: Colposcopy of cervix and upper adjacent vagina with biopsies and ECC Indications: ASCUS/positive Pap smear Findings: Adequate colposcopy; acetowhite epithelium at 1:00 and 9:00, possible atypical vessel at 3:00 Biopsies:  ECC  Cervix 3:00  Cervix 1:00  Cervix 9:00 Description: Verbal consent is obtained.  Patient is placed in dorsolithotomy position.  Peterson speculum was placed into the vagina to facilitate visualization of the cervix and upper adjacent vagina.  Acetic acid solution is used with Fox swabs to paint the vagina and cervix.  The above-noted findings are identified.  ECC is performed with a serrated curette.  Cervical biopsies are performed with tissular biopsy forceps at 3:00, 1:00, and 9:00.  Monsel solution is applied for hemostasis.  Blood loss is minimal.  Applications are none.  ASSESSMENT: 1.  ASCUS/positive high-risk HPV Pap smear 2.  History of recent trichomonas infection, treated 3.  Adequate colposcopy with evidence of acetowhite epithelium and atypical vessel as noted 4.  ECC 5.  Cervix biopsy 3:00 6.  Cervix biopsy 1:00 7.  Cervix biopsy 9:00 8.  Return in 2 weeks for follow-up on biopsies as well as Mirena IUD insertion  Herold Harms, MD  Note: This dictation was prepared  with Dragon dictation along with smaller phrase technology. Any transcriptional errors that result from this process are unintentional.

## 2018-03-30 NOTE — Addendum Note (Signed)
Addended by: Marchelle Folks on: 03/30/2018 12:42 PM   Modules accepted: Orders

## 2018-04-13 ENCOUNTER — Encounter: Payer: Self-pay | Admitting: Obstetrics and Gynecology

## 2018-04-13 ENCOUNTER — Ambulatory Visit (INDEPENDENT_AMBULATORY_CARE_PROVIDER_SITE_OTHER): Payer: 59 | Admitting: Obstetrics and Gynecology

## 2018-04-13 VITALS — BP 116/81 | Ht 66.0 in | Wt 152.7 lb

## 2018-04-13 DIAGNOSIS — N871 Moderate cervical dysplasia: Secondary | ICD-10-CM | POA: Diagnosis not present

## 2018-04-13 NOTE — Patient Instructions (Signed)
1.  LEEP cone biopsy is scheduled for the first week of December 2.  Return the week before surgery for preop appointment   Cervical Conization Cervical conization (cone biopsy) is a procedure in which a cone-shaped portion of the cervix is cut out so that it can be examined under a microscope. The procedure is done to check for cancer cells or cells that might turn into cancer (precancerous cells). You may have this procedure if:  You have abnormal bleeding from your cervix.  You had an abnormal Pap test.  Something abnormal was seen on your cervix during an exam.  This procedure is performed in either a health care provider's office or in an operating room. Tell a health care provider about:  Any allergies you have.  All medicines you are taking, including vitamins, herbs, eye drops, creams, and over-the-counter medicines.  Any problems you or family members have had with the use of anesthetic medicines.  Any blood disorders you have.  Any surgeries you have had.  Any medical conditions you have.  Your smoking habits.  When you normally have your period.  Whether you are pregnant or may be pregnant. What are the risks? Generally, this is a safe procedure. However, problems may occur, including:  Heavy bleeding for several days or weeks after the procedure.  Allergic reactions to medicines or dyes.  Increased risk of preterm labor in future pregnancies.  Infection (rare).  Damage to the cervix or other structures or organs (rare).  What happens before the procedure? Staying hydrated Follow instructions from your health care provider about hydration, which may include:  Up to 2 hours before the procedure - you may continue to drink clear liquids, such as water, clear fruit juice, black coffee, and plain tea.  Eating and drinking restrictions Follow instructions from your health care provider about eating and drinking, which may include:  8 hours before the  procedure - stop eating heavy meals or foods such as meat, fried foods, or fatty foods.  6 hours before the procedure - stop eating light meals or foods, such as toast or cereal.  6 hours before the procedure - stop drinking milk or drinks that contain milk.  2 hours before the procedure - stop drinking clear liquids.  General instructions  Do not douche, have sex, use tampons, or use any vaginal medicines before the procedure as told by your health care provider.  You may be asked to empty your bladder and bowel right before the procedure.  Ask your health care provider about: ? Changing or stopping your normal medicines. This is important if you take diabetes medicines or blood thinners. ? Taking medicines such as aspirin and ibuprofen. These medicines can thin your blood. Do not take these medicines before your procedure if your doctor tells you not to.  Plan to have someone take you home from the hospital or clinic. What happens during the procedure?  To reduce your risk of infection: ? Your health care team will wash or sanitize their hands. ? Your skin will be washed with soap. ? Hair may be removed from the surgical area.  You will undress from the waist down and be given a gown to wear.  You will lie on an examining table and put your feet in stirrups.  An IV tube will be inserted into one of your veins.  You will be given one or more of the following: ? A medicine to help you relax (sedative). ? A medicine to  numb the area (local anesthetic). ? A medicine to make you fall asleep (general anesthetic). ? A medicine that numbs the cervix (cervical block).  A lubricated device called a speculum will be inserted into your vagina. It will be used to spread open the walls of the vagina so your health care provider can see the inside of the vagina and cervix better.  An instrument that has a magnifying lens and a light (colposcope) will let your health care provider examine  the cervix more closely.  Your health care provider will apply a solution to your cervix. This turns abnormal areas a pale color.  A tissue sample will be removed from the cervix using one of the following methods: ? The cold knife method. In this method, the tissue is cut out with a knife (scalpel). ? The loop electrosurgical excision procedure (LEEP) method. In this method, the tissue is cut out with a thin wire that can burn (cauterize) the tissue with an electrical current. ? Laser treatment method. In this method, the tissue is cut out and then cauterized with a laser beam to prevent bleeding.  Your health care provider will apply a paste over the biopsy areas to help control bleeding.  The tissue sample will be examined under a microscope. The procedure may vary among health care providers and hospitals. What happens after the procedure?  Your blood pressure, heart rate, breathing rate, and blood oxygen level will be monitored often until the medicines you were given have worn off.  If you were given a local anesthetic, you will rest at the clinic or hospital until you are stable and feel ready to go home.  If you were given a general anesthetic, you may be monitored for a longer period of time.  You may have some cramping.  You may have bloody discharge or light to moderate bleeding.  You may have dark discharge coming from your vagina. This is from the paste used on the cervix to prevent bleeding. Summary  Cervical conization is a procedure in which a cone-shaped portion of the cervix is cut out so that it can be examined under a microscope.  The procedure is done to check for cancer cells or cells that might turn into cancer (precancerous cells). This information is not intended to replace advice given to you by your health care provider. Make sure you discuss any questions you have with your health care provider. Document Released: 02/18/2005 Document Revised: 05/13/2016  Document Reviewed: 05/13/2016 Elsevier Interactive Patient Education  2017 [employer]

## 2018-04-13 NOTE — Progress Notes (Signed)
Chief complaint: 1.  Moderate cervical dysplasia on colposcopic directed biopsies 2.  Conference for surgical management planning  Tiffany Bowen presents today for conference to discuss management of multifocal high-grade dysplasia. Patient desires IUD.  She understands this will be placed following LEEP procedure.   Colposcopy on 03/30/2018 demonstrated multifocal abnormalities; pathology showed a negative ECC, CIN-2 in 2 out of 3 biopsies; other biopsy showed CIN-1. In light of these findings patient is recommended to undergo LEEP cone biopsy.  Abnormal Pap smear history: 12/20/2015 Pap/HPV ASCUS/H/positive HPV 03/30/2018 colposcopy with cervical biopsies/ECC  ECC-negative  Cervical biopsy 1:00-CIN-2  Cervical biopsy 3:00-CIN-1  Cervical biopsy 9:00-CIN-2 01/26/2018 Pap/HPV ASCUS/POSITIVE; trichomonas present; GC/CT negative/negative 01/26/2018 STD screening:  HIV negative  RPR negative  Hepatitis B surface antigen negative  Hepatitis C antibody negative  HSV-1 positive IgG  HSV-2 negative IgG  Trichomonas-POSITIVE (treated)  OBJECTIVE: BP 116/81   Ht 5\' 6"  (1.676 m)   Wt 152 lb 11.2 oz (69.3 kg)   LMP 04/13/2018   BMI 24.65 kg/m  Physical exam-deferred  ASSESSMENT: 1.  Multifocal high-grade dysplasia  PLAN: 1.  LEEP cone biopsy of the cervix-same-day surgery 2.  Return in 1 week prior to procedure for preop appointment  A total of 15 minutes were spent face-to-face with the patient during this encounter and over half of that time dealt with counseling and coordination of care.  Herold HarmsMartin A Elah Avellino, MD  Note: This dictation was prepared with Dragon dictation along with smaller phrase technology. Any transcriptional errors that result from this process are unintentional.

## 2018-05-13 ENCOUNTER — Telehealth: Payer: Self-pay | Admitting: Obstetrics and Gynecology

## 2018-05-13 NOTE — Telephone Encounter (Signed)
Patient is calling office today to see if she will still have the LEEP procedure since she is pregnant. I let her know that Dr. Valentino Saxonherry is out of the office and when she gets back we would let her know.

## 2018-05-13 NOTE — Telephone Encounter (Signed)
The patient called and stated that she would like to speak with a nurse if possible sometime today. No other information was disclosed. Please advise.

## 2018-05-14 NOTE — Telephone Encounter (Signed)
Please inform patient that the LEEP and IUD insertion can no longer be performed if she is pregnant, and will need to be performed after her pregnancy.  She will need to have her pre-op appointment changed to a confirmation visit.

## 2018-05-16 NOTE — Telephone Encounter (Signed)
Notified patient of message and patient verbalized understanding.

## 2018-05-22 ENCOUNTER — Encounter: Payer: Self-pay | Admitting: Emergency Medicine

## 2018-05-22 ENCOUNTER — Emergency Department
Admission: EM | Admit: 2018-05-22 | Discharge: 2018-05-22 | Disposition: A | Discharge status: Home / Self Care | Payer: 59 | Attending: Emergency Medicine | Admitting: Emergency Medicine

## 2018-05-22 ENCOUNTER — Emergency Department: Payer: 59

## 2018-05-22 DIAGNOSIS — R109 Unspecified abdominal pain: Secondary | ICD-10-CM

## 2018-05-22 DIAGNOSIS — A5901 Trichomonal vulvovaginitis: Secondary | ICD-10-CM

## 2018-05-22 DIAGNOSIS — O23591 Infection of other part of genital tract in pregnancy, first trimester: Secondary | ICD-10-CM | POA: Diagnosis not present

## 2018-05-22 DIAGNOSIS — Z3A01 Less than 8 weeks gestation of pregnancy: Secondary | ICD-10-CM | POA: Insufficient documentation

## 2018-05-22 DIAGNOSIS — O26899 Other specified pregnancy related conditions, unspecified trimester: Secondary | ICD-10-CM | POA: Diagnosis not present

## 2018-05-22 DIAGNOSIS — Z79899 Other long term (current) drug therapy: Secondary | ICD-10-CM | POA: Insufficient documentation

## 2018-05-22 DIAGNOSIS — R1030 Lower abdominal pain, unspecified: Secondary | ICD-10-CM | POA: Diagnosis present

## 2018-05-22 LAB — COMPREHENSIVE METABOLIC PANEL
ALT: 42 U/L (ref 0–44)
ANION GAP: 5 (ref 5–15)
AST: 34 U/L (ref 15–41)
Albumin: 4.1 g/dL (ref 3.5–5.0)
Alkaline Phosphatase: 46 U/L (ref 38–126)
BUN: 6 mg/dL (ref 6–20)
CHLORIDE: 106 mmol/L (ref 98–111)
CO2: 24 mmol/L (ref 22–32)
CREATININE: 0.67 mg/dL (ref 0.44–1.00)
Calcium: 8.5 mg/dL — ABNORMAL LOW (ref 8.9–10.3)
Glucose, Bld: 99 mg/dL (ref 70–99)
POTASSIUM: 3.6 mmol/L (ref 3.5–5.1)
SODIUM: 135 mmol/L (ref 135–145)
Total Bilirubin: 0.4 mg/dL (ref 0.3–1.2)
Total Protein: 7.5 g/dL (ref 6.5–8.1)

## 2018-05-22 LAB — CBC
HEMATOCRIT: 36.6 % (ref 36.0–46.0)
HEMOGLOBIN: 11.7 g/dL — AB (ref 12.0–15.0)
MCH: 28.3 pg (ref 26.0–34.0)
MCHC: 32 g/dL (ref 30.0–36.0)
MCV: 88.6 fL (ref 80.0–100.0)
NRBC: 0 % (ref 0.0–0.2)
Platelets: 187 10*3/uL (ref 150–400)
RBC: 4.13 MIL/uL (ref 3.87–5.11)
RDW: 11.9 % (ref 11.5–15.5)
WBC: 4.6 10*3/uL (ref 4.0–10.5)

## 2018-05-22 LAB — URINALYSIS, COMPLETE (UACMP) WITH MICROSCOPIC
BILIRUBIN URINE: NEGATIVE
Glucose, UA: NEGATIVE mg/dL
HGB URINE DIPSTICK: NEGATIVE
Ketones, ur: NEGATIVE mg/dL
Nitrite: NEGATIVE
PH: 6 (ref 5.0–8.0)
Protein, ur: NEGATIVE mg/dL
SPECIFIC GRAVITY, URINE: 1.015 (ref 1.005–1.030)
WBC, UA: >50 WBC/hpf — ABNORMAL HIGH (ref 0–5)

## 2018-05-22 LAB — CHLAMYDIA/NGC RT PCR (ARMC ONLY)
CHLAMYDIA TR: NOT DETECTED
N GONORRHOEAE: NOT DETECTED

## 2018-05-22 LAB — WET PREP, GENITAL
Clue Cells Wet Prep HPF POC: NONE SEEN
SPERM: NONE SEEN
Yeast Wet Prep HPF POC: NONE SEEN

## 2018-05-22 LAB — LIPASE, BLOOD: LIPASE: 32 U/L (ref 11–51)

## 2018-05-22 LAB — HCG, QUANTITATIVE, PREGNANCY: HCG, BETA CHAIN, QUANT, S: 3709 m[IU]/mL — AB (ref <5)

## 2018-05-22 MED ORDER — METRONIDAZOLE 500 MG PO TABS
500.0000 mg | ORAL_TABLET | Freq: Once | ORAL | Status: AC
  Administered 2018-05-22: 500 mg via ORAL
  Filled 2018-05-22: qty 1

## 2018-05-22 MED ORDER — METRONIDAZOLE 500 MG PO TABS: 500.0000 mg | ORAL_TABLET | Freq: Two times a day (BID) | ORAL | 0 refills | Status: AC

## 2018-05-22 NOTE — ED Notes (Signed)
Pt denies N/V/D at this time. Pt denies fever at home as well.

## 2018-05-22 NOTE — ED Triage Notes (Signed)
Patient presents to the ED with left lower quadrant abdominal pain.  Patient states she is approx. [redacted] weeks pregnant.  Patient denies vaginal bleeding.  Patient reports history of 2 previous miscarriages.  Patient is in no obvious distress at this time.  Denies nausea, vomiting, and diarrhea.

## 2018-05-22 NOTE — ED Provider Notes (Signed)
[facility] Emergency Department Provider Note  ____________________________________________  Time seen: Approximately 2:32 PM  I have reviewed the triage vital signs and the nursing notes.   HISTORY  Chief Complaint Abdominal Pain   HPI Tiffany Bowen is a 27 y.o. female currently at 216 weeks gestational age per LMP of 04/08/2018 who presents for evaluation of abdominal pain.  Patient has had intermittent cramping lower abdominal pain for the last 6 weeks.  Has not seen OB/GYN or had an ultrasound for this pregnancy yet.  No pain at this time.  The pain is mild in intensity.  No vaginal discharge, dysuria or hematuria, fever or chills, nausea, vomiting, or diarrhea..  Patient endorses want to live births and 2 spontaneous miscarriages.  This is her fourth pregnancy.  No prior abdominal surgeries.  Past Medical History:  Diagnosis Date  . Abdominal pain   . Anemia   . Breastfeeding (infant)   . BV (bacterial vaginosis)   . 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 Post partum depression   . STD (sexually transmitted disease)     Patient Active Problem List   Diagnosis Date Noted  . Vaginal discharge 01/26/2018  . Abnormal uterine bleeding (AUB) 01/26/2018  . STD exposure 01/26/2018  . Pap smear of cervix with ASCUS, cannot exclude HGSIL 05/08/2016  . Chlamydia infection affecting pregnancy in first trimester 05/08/2016    Past Surgical History:  Procedure Laterality Date  . NO PAST SURGERIES      Prior to Admission medications   Medication Sig Start Date End Date Taking? Authorizing Provider  PRENATAL 27-1 MG TABS Take 1 tablet by mouth daily.   Yes [provider]  metroNIDAZOLE (FLAGYL) 500 MG tablet Take 1 tablet (500 mg total) by mouth 2 (two) times daily for 7 days. 05/22/18 05/29/18  Nita SickleVeronese, Cisco, MD    Allergies Patient has no known allergies.  Family History  Problem  Relation Age of Onset  . Cancer Neg Hx   . Diabetes Neg Hx   . Heart disease Neg Hx     Social History Social History   Tobacco Use  . Smoking status: Never Smoker  . Smokeless tobacco: Never Used  Substance Use Topics  . Alcohol use: No  . Drug use: No    Review of Systems  Constitutional: Negative for fever. Eyes: Negative for visual changes. ENT: Negative for sore throat. Neck: No neck pain  Cardiovascular: Negative for chest pain. Respiratory: Negative for shortness of breath. Gastrointestinal: + lower abdominal cramping. No vomiting or diarrhea. Genitourinary: Negative for dysuria. Musculoskeletal: Negative for back pain. Skin: Negative for rash. Neurological: Negative for headaches, weakness or numbness. Psych: No SI or HI  ____________________________________________   PHYSICAL EXAM:  VITAL SIGNS: ED Triage Vitals  Enc Vitals Group     BP 05/22/18 1151 (!) 106/39     Pulse Rate 05/22/18 1151 94     Resp 05/22/18 1151 18     Temp 05/22/18 1151 99.1 F (37.3 C)     Temp Source 05/22/18 1151 Oral     SpO2 05/22/18 1151 100 %     Weight 05/22/18 1152 154 lb (69.9 kg)     Height 05/22/18 1152 5\' 6"  (1.676 m)     Head Circumference --      Peak Flow --      Pain Score 05/22/18 1152 0     Pain Loc --  Pain Edu? --      Excl. in GC? --     Constitutional: Alert and oriented. Well appearing and in no apparent distress. HEENT:      Head: Normocephalic and atraumatic.         Eyes: Conjunctivae are normal. Sclera is non-icteric.       Mouth/Throat: Mucous membranes are moist.       Neck: Supple with no signs of meningismus. Cardiovascular: Regular rate and rhythm. No murmurs, gallops, or rubs. 2+ symmetrical distal pulses are present in all extremities. No JVD. Respiratory: Normal respiratory effort. Lungs are clear to auscultation bilaterally. No wheezes, crackles, or rhonchi.  Gastrointestinal: Soft, mild suprapubic tenderness, and non distended with  positive bowel sounds. No rebound or guarding. Pelvic exam: Normal external genitalia, no rashes or lesions. Moderate amount of thin greenish discharge. Os closed. No cervical motion tenderness.  No uterine or adnexal tenderness.   Musculoskeletal: Nontender with normal range of motion in all extremities. No edema, cyanosis, or erythema of extremities. Neurologic: Normal speech and language. Face is symmetric. Moving all extremities. No gross focal neurologic deficits are appreciated. Skin: Skin is warm, dry and intact. No rash noted. Psychiatric: Mood and affect are normal. Speech and behavior are normal.  ____________________________________________   LABS (all labs ordered are listed, but only abnormal results are displayed)  Labs Reviewed  WET PREP, GENITAL - Abnormal; Notable for the following components:      Result Value   Trich, Wet Prep PRESENT (*)    WBC, Wet Prep HPF POC MODERATE (*)    All other components within normal limits  COMPREHENSIVE METABOLIC PANEL - Abnormal; Notable for the following components:   Calcium 8.5 (*)    All other components within normal limits  CBC - Abnormal; Notable for the following components:   Hemoglobin 11.7 (*)    All other components within normal limits  URINALYSIS, COMPLETE (UACMP) WITH MICROSCOPIC - Abnormal; Notable for the following components:   Color, Urine YELLOW (*)    APPearance HAZY (*)    Leukocytes, UA LARGE (*)    WBC, UA >50 (*)    Bacteria, UA RARE (*)    All other components within normal limits  HCG, QUANTITATIVE, PREGNANCY - Abnormal; Notable for the following components:   hCG, Beta Chain, Quant, S 3,709 (*)    All other components within normal limits  CHLAMYDIA/NGC RT PCR ([facility] ONLY)  LIPASE, BLOOD   ____________________________________________  EKG  none  ____________________________________________  RADIOLOGY  I have personally reviewed the images performed during this visit and I agree with  the Radiologist's read.   Interpretation by Radiologist:  [place] Ob Comp Less 14 Wks  Result Date: 05/22/2018 CLINICAL DATA:  Pregnant patient with lower abdominal pain. EXAM: OBSTETRIC <14 WK US AND TRANSVAGINAL OB US TECHNIQUE: Both transabdominal and transvaginal ultrasound examinations were performed for complete evaluation of the gestation as well as the maternal uterus, adnexal regions, and pelvic cul-de-sac. Transvaginal technique was performed to assess early pregnancy. COMPARISON:  None. FINDINGS: Intrauterine gestational sac: Single Yolk sac:  Visualized. Embryo:  Not Visualized. Cardiac Activity: Not Visualized. MSD: 8.0 mm   5 w 4 d Subchorionic hemorrhage:  None visualized. Maternal uterus/adnexae: Normal right and left ovaries. There is a 1.7 cm cyst within the right ovary. No free fluid in the pelvis. IMPRESSION: Probable early intrauterine gestational sac and yolk sac but no fetal pole or cardiac activity yet visualized. Recommend follow-up quantitative B-HCG levels and follow-up [place]  in 14 days to assess viability. This recommendation follows SRU consensus guidelines: Diagnostic Criteria for Nonviable Pregnancy Early in the First Trimester. Malva Limes Med 2013; 161:0960-45. Electronically Signed   By: Annia Belt M.D.   On: 05/22/2018 15:09   US Ob Transvaginal  Result Date: 05/22/2018 CLINICAL DATA:  Pregnant patient with lower abdominal pain. EXAM: OBSTETRIC <14 WK [place] AND TRANSVAGINAL OB US TECHNIQUE: Both transabdominal and transvaginal ultrasound examinations were performed for complete evaluation of the gestation as well as the maternal uterus, adnexal regions, and pelvic cul-de-sac. Transvaginal technique was performed to assess early pregnancy. COMPARISON:  None. FINDINGS: Intrauterine gestational sac: Single Yolk sac:  Visualized. Embryo:  Not Visualized. Cardiac Activity: Not Visualized. MSD: 8.0 mm   5 w 4 d Subchorionic hemorrhage:  None visualized. Maternal uterus/adnexae: Normal  right and left ovaries. There is a 1.7 cm cyst within the right ovary. No free fluid in the pelvis. IMPRESSION: Probable early intrauterine gestational sac and yolk sac but no fetal pole or cardiac activity yet visualized. Recommend follow-up quantitative B-HCG levels and follow-up US in 14 days to assess viability. This recommendation follows SRU consensus guidelines: Diagnostic Criteria for Nonviable Pregnancy Early in the First Trimester. Malva Limes Med 2013; 409:8119-14. Electronically Signed   By: Annia Belt M.D.   On: 05/22/2018 15:09      ____________________________________________   PROCEDURES  Procedure(s) performed: None Procedures Critical Care performed:  None ____________________________________________   INITIAL IMPRESSION / ASSESSMENT AND PLAN / ED COURSE  27 y.o. female currently at [redacted] weeks gestational age per LMP of 04/08/2018 who presents for evaluation of lower intermittent cramping abdominal pain x 6 weeks.  Patient is extremely well-appearing in no distress, has normal abdominal exam with mild suprapubic tenderness, no rebound or guarding.  Pelvic exam showing moderate amount of thin greenish discharge with no CMT, no adnexal tenderness.  Differential diagnoses including STD versus UTI versus ectopic versus PID versus miscarriage.  Will send patient for transvaginal ultrasound.  Wet prep, GC and chlamydia pending.    _________________________ 3:58 PM on 05/22/2018 -----------------------------------------  Wet prep positive for trichomonas.  Patient was started on Flagyl.  Transvaginal ultrasound showing a early gestational sac with no fetal pole.  Recommended follow-up in 2 weeks for repeat ultrasound.  Discussed prenatal vitamins.  Discussed in a return precautions with patient.   As part of my medical decision making, I reviewed the following data within the electronic MEDICAL RECORD NUMBER Nursing notes reviewed and incorporated, Labs reviewed , Old chart reviewed,  Radiograph reviewed , Notes from prior ED visits and [place] Controlled Substance Database    Pertinent labs & imaging results that were available during my care of the patient were reviewed by me and considered in my medical decision making (see chart for details).    ____________________________________________   FINAL CLINICAL IMPRESSION(S) / ED DIAGNOSES  Final diagnoses:  Trichomonas vaginitis  Abdominal pain affecting pregnancy      NEW MEDICATIONS STARTED DURING THIS VISIT:  ED Discharge Orders         Ordered    metroNIDAZOLE (FLAGYL) 500 MG tablet  2 times daily     05/22/18 1553           Note:  This document was prepared using Dragon voice recognition software and may include unintentional dictation errors.    Nita Sickle, MD 05/22/18 [phone]

## 2018-05-30 ENCOUNTER — Other Ambulatory Visit: Payer: Self-pay

## 2018-05-30 ENCOUNTER — Encounter: Payer: Self-pay | Admitting: Emergency Medicine

## 2018-05-30 ENCOUNTER — Emergency Department: Payer: Medicaid Other

## 2018-05-30 DIAGNOSIS — O208 Other hemorrhage in early pregnancy: Secondary | ICD-10-CM | POA: Diagnosis not present

## 2018-05-30 DIAGNOSIS — Z3A01 Less than 8 weeks gestation of pregnancy: Secondary | ICD-10-CM | POA: Insufficient documentation

## 2018-05-30 DIAGNOSIS — R102 Pelvic and perineal pain: Secondary | ICD-10-CM | POA: Diagnosis not present

## 2018-05-30 DIAGNOSIS — O200 Threatened abortion: Secondary | ICD-10-CM | POA: Diagnosis not present

## 2018-05-30 DIAGNOSIS — O209 Hemorrhage in early pregnancy, unspecified: Secondary | ICD-10-CM | POA: Diagnosis present

## 2018-05-30 LAB — HCG, QUANTITATIVE, PREGNANCY: hCG, Beta Chain, Quant, S: 4574 m[IU]/mL — ABNORMAL HIGH (ref <5)

## 2018-05-30 LAB — COMPREHENSIVE METABOLIC PANEL
ALT: 18 U/L (ref 0–44)
ANION GAP: 6 (ref 5–15)
AST: 13 U/L — ABNORMAL LOW (ref 15–41)
Albumin: 4.1 g/dL (ref 3.5–5.0)
Alkaline Phosphatase: 48 U/L (ref 38–126)
BUN: 11 mg/dL (ref 6–20)
CO2: 26 mmol/L (ref 22–32)
Calcium: 8.9 mg/dL (ref 8.9–10.3)
Chloride: 104 mmol/L (ref 98–111)
Creatinine, Ser: 0.67 mg/dL (ref 0.44–1.00)
GFR calc Af Amer: >60 mL/min (ref >60)
GFR calc non Af Amer: >60 mL/min (ref >60)
GLUCOSE: 101 mg/dL — AB (ref 70–99)
Potassium: 3.8 mmol/L (ref 3.5–5.1)
Sodium: 136 mmol/L (ref 135–145)
Total Bilirubin: 0.8 mg/dL (ref 0.3–1.2)
Total Protein: 7.7 g/dL (ref 6.5–8.1)

## 2018-05-30 LAB — CBC WITH DIFFERENTIAL/PLATELET
Abs Immature Granulocytes: 0.03 10*3/uL (ref 0.00–0.07)
Basophils Absolute: 0.1 10*3/uL (ref 0.0–0.1)
Basophils Relative: 1 %
EOS PCT: 3 %
Eosinophils Absolute: 0.2 10*3/uL (ref 0.0–0.5)
HCT: 36.6 % (ref 36.0–46.0)
HEMOGLOBIN: 11.6 g/dL — AB (ref 12.0–15.0)
Immature Granulocytes: 0 %
Lymphocytes Relative: 44 %
Lymphs Abs: 3.6 10*3/uL (ref 0.7–4.0)
MCH: 28 pg (ref 26.0–34.0)
MCHC: 31.7 g/dL (ref 30.0–36.0)
MCV: 88.4 fL (ref 80.0–100.0)
Monocytes Absolute: 0.6 10*3/uL (ref 0.1–1.0)
Monocytes Relative: 7 %
Neutro Abs: 3.7 10*3/uL (ref 1.7–7.7)
Neutrophils Relative %: 45 %
Platelets: 271 10*3/uL (ref 150–400)
RBC: 4.14 MIL/uL (ref 3.87–5.11)
RDW: 11.8 % (ref 11.5–15.5)
WBC: 8.1 10*3/uL (ref 4.0–10.5)
nRBC: 0 % (ref 0.0–0.2)

## 2018-05-30 LAB — ABO/RH: ABO/RH(D): O POS

## 2018-05-30 NOTE — ED Triage Notes (Signed)
Patient ambulatory to triage with steady gait, without difficulty or distress noted; pt reports heavy menstrual bleeding and lower abd cramping; st began 4 days ago and has increased; [redacted]wks pregnant; G4P1; pt at [facility]; seen here 12/29 but u/s was too early

## 2018-05-31 ENCOUNTER — Emergency Department
Admission: EM | Admit: 2018-05-31 | Discharge: 2018-05-31 | Disposition: A | Discharge status: Home / Self Care | Payer: Medicaid Other | Attending: Emergency Medicine | Admitting: Emergency Medicine

## 2018-05-31 DIAGNOSIS — O200 Threatened abortion: Secondary | ICD-10-CM

## 2018-05-31 DIAGNOSIS — O4690 Antepartum hemorrhage, unspecified, unspecified trimester: Secondary | ICD-10-CM

## 2018-05-31 NOTE — ED Provider Notes (Signed)
[facility] Emergency Department Provider Note   ____________________________________________   First MD Initiated Contact with Patient 05/31/18 [phone]     (approximate)  I have reviewed the triage vital signs and the nursing notes.   HISTORY  Chief Complaint Vaginal Bleeding    HPI Tiffany Bowen is a 28 y.o. female G4, P1 approximately [redacted] weeks pregnant by dates who presents to the ED from home with a chief complaint of vaginal bleeding and pelvic cramping. Patient reports 3-4 day history of vaginal spotting, heavier bleeding tonight.  Symptoms associated with pelvic cramping.  Denies associated fever, chills, chest pain, shortness of breath, nausea, vomiting, dysuria.  Denies recent travel or trauma.   Past Medical History:  Diagnosis Date  . Abdominal pain   . Anemia   . Breastfeeding (infant)   . BV (bacterial vaginosis)   . 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 Post partum depression   . STD (sexually transmitted disease)     Patient Active Problem List   Diagnosis Date Noted  . Vaginal discharge 01/26/2018  . Abnormal uterine bleeding (AUB) 01/26/2018  . STD exposure 01/26/2018  . Pap smear of cervix with ASCUS, cannot exclude HGSIL 05/08/2016  . Chlamydia infection affecting pregnancy in first trimester 05/08/2016    Past Surgical History:  Procedure Laterality Date  . NO PAST SURGERIES      Prior to Admission medications   Medication Sig Start Date End Date Taking? Authorizing Provider  PRENATAL 27-1 MG TABS Take 1 tablet by mouth daily.    [provider]    Allergies Patient has no known allergies.  Family History  Problem Relation Age of Onset  . Cancer Neg Hx   . Diabetes Neg Hx   . Heart disease Neg Hx     Social History Social History   Tobacco Use  . Smoking status: Never Smoker  . Smokeless tobacco: Never Used  Substance Use Topics  . Alcohol use: No   . Drug use: No    Review of Systems  Constitutional: No fever/chills Eyes: No visual changes. ENT: No sore throat. Cardiovascular: Denies chest pain. Respiratory: Denies shortness of breath. Gastrointestinal: Positive for pelvic cramping.  No abdominal pain.  No nausea, no vomiting.  No diarrhea.  No constipation. Genitourinary: Positve for vaginal bleeding.  Negative for dysuria. Musculoskeletal: Negative for back pain. Skin: Negative for rash. Neurological: Negative for headaches, focal weakness or numbness.   ____________________________________________   PHYSICAL EXAM:  VITAL SIGNS: ED Triage Vitals  Enc Vitals Group     BP 05/30/18 2202 126/67     Pulse Rate 05/30/18 2202 75     Resp 05/30/18 2202 18     Temp 05/30/18 2202 97.9 F (36.6 C)     Temp Source 05/30/18 2202 Oral     SpO2 05/30/18 2202 100 %     Weight 05/30/18 2203 154 lb (69.9 kg)     Height 05/30/18 2203 5\' 6"  (1.676 m)     Head Circumference --      Peak Flow --      Pain Score 05/30/18 2202 5     Pain Loc --      Pain Edu? --      Excl. in GC? --     Constitutional: Alert and oriented. Well appearing and in no acute distress. Eyes: Conjunctivae are normal. PERRL. EOMI. Head: Atraumatic. Nose: No congestion/rhinnorhea. Mouth/Throat: Mucous membranes are moist.  Oropharynx non-erythematous. Neck: No stridor.   Cardiovascular: Normal rate, regular rhythm. Grossly normal heart sounds.  Good peripheral circulation. Respiratory: Normal respiratory effort.  No retractions. Lungs CTAB. Gastrointestinal: Soft and nontender to light or deep palpation. No distention. No abdominal bruits. No CVA tenderness. Musculoskeletal: No lower extremity tenderness nor edema.  No joint effusions. Neurologic:  Normal speech and language. No gross focal neurologic deficits are appreciated. No gait instability. Skin:  Skin is warm, dry and intact. No rash noted. Psychiatric: Mood and affect are normal. Speech and  behavior are normal.  ____________________________________________   LABS (all labs ordered are listed, but only abnormal results are displayed)  Labs Reviewed  CBC WITH DIFFERENTIAL/PLATELET - Abnormal; Notable for the following components:      Result Value   Hemoglobin 11.6 (*)    All other components within normal limits  COMPREHENSIVE METABOLIC PANEL - Abnormal; Notable for the following components:   Glucose, Bld 101 (*)    AST 13 (*)    All other components within normal limits  HCG, QUANTITATIVE, PREGNANCY - Abnormal; Notable for the following components:   hCG, Beta Chain, Quant, S 4,574 (*)    All other components within normal limits  ABO/RH   ____________________________________________  EKG  None ____________________________________________  RADIOLOGY  ED MD interpretation: 5 weeks 6-day IUP, right ovarian cyst  Official radiology report(s): [place] Ob Comp Less 14 Wks  Result Date: 05/31/2018 CLINICAL DATA:  Initial evaluation for acute vaginal bleeding, early pregnancy. EXAM: OBSTETRIC <14 WK US AND TRANSVAGINAL OB US TECHNIQUE: Both transabdominal and transvaginal ultrasound examinations were performed for complete evaluation of the gestation as well as the maternal uterus, adnexal regions, and pelvic cul-de-sac. Transvaginal technique was performed to assess early pregnancy. COMPARISON:  Prior ultrasound from 05/22/2018 FINDINGS: Intrauterine gestational sac: Single Yolk sac:  Present Embryo:  Present Cardiac Activity: Present Heart Rate: 72 bpm CRL: 2.6 mm   5 w   6 d                  US EDC: 01/24/2019 Subchorionic hemorrhage:  None visualized. Maternal uterus/adnexae: Ovaries normal in appearance bilaterally. 1.9 cm simple cyst noted within the right ovary, most consistent with a corpus luteal cyst. No free fluid. IMPRESSION: 1. Single viable intrauterine pregnancy as above, estimated gestational age [redacted] weeks and 6 days by crown-rump length, with ultrasound EDC of  01/24/2019. 2. 1.9 cm right ovarian corpus luteal cyst. 3. No other acute maternal uterine or adnexal abnormality identified. Electronically Signed   By: Rise MuBenjamin  McClintock M.D.   On: 05/31/2018 00:56   [place] Ob Transvaginal  Result Date: 05/31/2018 CLINICAL DATA:  Initial evaluation for acute vaginal bleeding, early pregnancy. EXAM: OBSTETRIC <14 WK US AND TRANSVAGINAL OB US TECHNIQUE: Both transabdominal and transvaginal ultrasound examinations were performed for complete evaluation of the gestation as well as the maternal uterus, adnexal regions, and pelvic cul-de-sac. Transvaginal technique was performed to assess early pregnancy. COMPARISON:  Prior ultrasound from 05/22/2018 FINDINGS: Intrauterine gestational sac: Single Yolk sac:  Present Embryo:  Present Cardiac Activity: Present Heart Rate: 72 bpm CRL: 2.6 mm   5 w   6 d                  US EDC: 01/24/2019 Subchorionic hemorrhage:  None visualized. Maternal uterus/adnexae: Ovaries normal in appearance bilaterally. 1.9 cm simple cyst noted within the right ovary, most consistent with a corpus luteal cyst. No free fluid. IMPRESSION: 1. Single viable intrauterine pregnancy as  above, estimated gestational age [redacted] weeks and 6 days by crown-rump length, with ultrasound EDC of 01/24/2019. 2. 1.9 cm right ovarian corpus luteal cyst. 3. No other acute maternal uterine or adnexal abnormality identified. Electronically Signed   By: Rise Mu M.D.   On: 05/31/2018 00:56    ____________________________________________   PROCEDURES  Procedure(s) performed:   Pelvic exam: External exam within normal limits without rashes, lesions or vesicles.  Speculum exam reveals mild vaginal bleeding.  Cervical os is closed.  Bimanual exam within normal limits..  Procedures  Critical Care performed: No  ____________________________________________   INITIAL IMPRESSION / ASSESSMENT AND PLAN / ED COURSE  As part of my medical decision making, I reviewed the  following data within the electronic MEDICAL RECORD NUMBER History obtained from family, Nursing notes reviewed and incorporated, Labs reviewed, Radiograph reviewed and Notes from prior ED visits   28 year old G77, P57 female approximately [redacted] weeks pregnant by dates who presents with vaginal bleeding. Differential diagnosis includes, but is not limited to, ovarian cyst, ovarian torsion, acute appendicitis, diverticulitis, urinary tract infection/pyelonephritis, endometriosis, bowel obstruction, colitis, renal colic, gastroenteritis, hernia, fibroids, endometriosis, pregnancy related pain including ectopic pregnancy, etc.  Laboratory results remarkable for beta hCG 4574.  Patient is O+ blood type.  Ultrasound demonstrates IUP, right ovarian cyst.  Patient has an upcoming appointment with her OB/GYN on 1/10.  Strict pelvic rest precautions given.  Patient and family member verbalize understanding and agree with plan of care.       ____________________________________________   FINAL CLINICAL IMPRESSION(S) / ED DIAGNOSES  Final diagnoses:  Threatened miscarriage  Vaginal bleeding in pregnancy     ED Discharge Orders    None       Note:  This document was prepared using Dragon voice recognition software and may include unintentional dictation errors.    Irean Hong, MD 05/31/18 [phone]

## 2018-05-31 NOTE — Discharge Instructions (Signed)
1.  Do not use tampons, douche or have sexual intercourse until seen by your doctor. 2.  Drink plenty of fluids daily. 3.  Return to the ER for worsening symptoms, persistent vomiting, difficulty breathing or other concerns.

## 2018-05-31 NOTE — ED Notes (Signed)
OBGYN cart at bedside

## 2018-06-02 ENCOUNTER — Other Ambulatory Visit: Payer: Self-pay | Admitting: Obstetrics and Gynecology

## 2018-06-02 ENCOUNTER — Encounter: Payer: Self-pay | Admitting: Obstetrics and Gynecology

## 2018-06-02 ENCOUNTER — Emergency Department: Admission: EM | Admit: 2018-06-02 | Discharge: 2018-06-02 | Discharge status: Against Medical Advice | Payer: 59

## 2018-06-02 ENCOUNTER — Ambulatory Visit (INDEPENDENT_AMBULATORY_CARE_PROVIDER_SITE_OTHER): Payer: 59 | Admitting: Obstetrics and Gynecology

## 2018-06-02 ENCOUNTER — Ambulatory Visit (INDEPENDENT_AMBULATORY_CARE_PROVIDER_SITE_OTHER): Payer: 59

## 2018-06-02 ENCOUNTER — Telehealth: Payer: Self-pay

## 2018-06-02 VITALS — BP 115/75 | HR 71 | Ht 66.0 in | Wt 147.6 lb

## 2018-06-02 DIAGNOSIS — O3481 Maternal care for other abnormalities of pelvic organs, first trimester: Secondary | ICD-10-CM

## 2018-06-02 DIAGNOSIS — O209 Hemorrhage in early pregnancy, unspecified: Secondary | ICD-10-CM

## 2018-06-02 DIAGNOSIS — O200 Threatened abortion: Secondary | ICD-10-CM

## 2018-06-02 DIAGNOSIS — N8311 Corpus luteum cyst of right ovary: Secondary | ICD-10-CM | POA: Diagnosis not present

## 2018-06-02 NOTE — Telephone Encounter (Signed)
DJE reviewed u/s from today.  Sac seen but no embryo. Possible miscarriage.  Per DJE she is to have a scan tomorrow. ASC is on call to review scan per DJE.  Will send to [facility] to schedule. Pt to contact me if she has not heard from scheduling by 9 am.   Orders have been placed by DJE.

## 2018-06-02 NOTE — Progress Notes (Signed)
HPI:      Tiffany Bowen is a 28 y.o. [id number] who LMP was Patient's last menstrual period was 04/13/2018.  Subjective:   She presents today complaining of worsening vaginal bleeding with some pelvic cramping.  She has noticed some small clots but denies passage of tissue.  She says that she has been bleeding since the beginning of this pregnancy and has been in the emergency department.  An ultrasound from a previous emergency department visit revealed an intrauterine pregnancy with fetal heart tones. Of significant note patient has had 2 prior first trimester losses and 1 abortion.  She did have 1 successful vaginal term infant.    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 She does not have any pertinent problems on file. She  has a past surgical history that includes No past surgeries. Her family history is not on file. She  reports that she has never smoked. She has never used smokeless tobacco. She reports that she does not drink alcohol or use drugs. She has a current medication list which includes the following prescription(s): prenatal. She has No Known Allergies.       Review of Systems:  Review of Systems  Constitutional: Denied constitutional symptoms, night sweats, recent illness, fatigue, fever, insomnia and weight loss.  Eyes: Denied eye symptoms, eye pain, photophobia, vision change and visual disturbance.  Ears/Nose/Throat/Neck: Denied ear, nose, throat or neck symptoms, hearing loss, nasal discharge, sinus congestion and sore throat.  Cardiovascular: Denied cardiovascular symptoms, arrhythmia, chest pain/pressure, edema, exercise intolerance, orthopnea and palpitations.   Respiratory: Denied pulmonary symptoms, asthma, pleuritic pain, productive sputum, cough, dyspnea and wheezing.  Gastrointestinal: Denied, gastro-esophageal reflux, melena, nausea and vomiting.  Genitourinary: See HPI for additional information.  Musculoskeletal: Denied musculoskeletal symptoms, stiffness, swelling, muscle weakness and myalgia.  Dermatologic: Denied dermatology symptoms, rash and scar.  Neurologic: Denied neurology symptoms, dizziness, headache, neck pain and syncope.  Psychiatric: Denied psychiatric symptoms, anxiety and depression.  Endocrine: Denied endocrine symptoms including hot flashes and night sweats.   Meds:   Current Outpatient Medications on File Prior to Visit  Medication Sig Dispense Refill  . PRENATAL 27-1 MG TABS Take 1 tablet by mouth daily.     No current facility-administered medications on file prior to visit.     Objective:     Vitals:   06/02/18 0910  BP: 115/75  Pulse: 71                Assessment:    [id number] Patient Active Problem List   Diagnosis Date Noted  . Vaginal discharge 01/26/2018  . Abnormal uterine bleeding (AUB) 01/26/2018  . STD exposure 01/26/2018  . Pap smear of cervix with ASCUS, cannot exclude HGSIL 05/08/2016  . Chlamydia infection affecting pregnancy in first trimester 05/08/2016     1. Bleeding in early pregnancy     Threatened miscarriage.   Plan:            1.  SAb I have discussed the possibility of miscarriage with the patient.  I have informed her that vaginal bleeding, cramping or passage of tissue are the most common signs of miscarriage.  Should she have heavy bleeding, pass tissue or have other  problems, she has been informed to call the office immediately.  I have advised her to remain at pelvic rest for at least one week after her last episode of bleeding.  If she is currently working, I have instructed her to discontinue until this situation resolves.  Complete versus incomplete miscarriage  was discussed and the patient is aware that should she develop heavy bleeding, fever, or persistent crampy pelvic pain she may require a D & E to remove the remaining portion of the miscarried pregnancy.  I advised her to keep me informed should her condition change. 2.  Ultrasound today to determine fetal viability. Orders No orders of the defined types were placed in this encounter.   No orders of the defined types were placed in this encounter.     F/U  No follow-ups on file. I spent 18 minutes involved in the care of this patient of which greater than 50% was spent discussing miscarriage, signs and symptoms, possible successful pregnancy, future follow-ups and management of pregnancy loss if that should be the case.  All questions answered.  Elonda Husky, M.D. 06/02/2018 11:45 AM

## 2018-06-02 NOTE — Progress Notes (Deleted)
Tiffany Bowen presents for NOB nurse interview visit. Pregnancy confirmation done [facility] .  G- .  P-    . Pregnancy education material explained and given. ___ cats in the home. NOB labs ordered, (sickle cell). HIV labs and Drug screen were explained optional and she did not decline. Drug screen ordered/declined. PNV encouraged. Genetic screening options discussed. Genetic testing: Ordered/Declined/Unsure.  Pt may discuss with provider. Pt. To follow up with provider in __ weeks for NOB physical.  All questions answered. 05/31/18 ED with bleeding.

## 2018-06-02 NOTE — Progress Notes (Signed)
Patient comes in today for bleeding in first trimester. Patient has had heavy bleeding with clots since 05/30/2018. She is having abdominal cramping. Her lower back started hurting last night.

## 2018-06-02 NOTE — Addendum Note (Signed)
Addended by: Elonda Husky on: 06/02/2018 03:22 PM   Modules accepted: Orders

## 2018-06-03 ENCOUNTER — Other Ambulatory Visit: Payer: Self-pay | Admitting: Obstetrics and Gynecology

## 2018-06-03 DIAGNOSIS — O209 Hemorrhage in early pregnancy, unspecified: Secondary | ICD-10-CM

## 2018-06-06 ENCOUNTER — Ambulatory Visit
Admission: RE | Admit: 2018-06-06 | Discharge: 2018-06-06 | Disposition: A | Discharge status: Home / Self Care | Payer: Medicaid Other | Source: Ambulatory Visit | Attending: Obstetrics and Gynecology | Admitting: Obstetrics and Gynecology

## 2018-06-06 DIAGNOSIS — O209 Hemorrhage in early pregnancy, unspecified: Secondary | ICD-10-CM | POA: Diagnosis not present

## 2018-06-08 ENCOUNTER — Telehealth: Payer: Self-pay | Admitting: Obstetrics and Gynecology

## 2018-06-08 NOTE — Telephone Encounter (Signed)
LMTRC

## 2018-06-08 NOTE — Telephone Encounter (Signed)
Patient returned call, aware labs are still pending.  Patient verbalized understanding.

## 2018-06-08 NOTE — Telephone Encounter (Signed)
The patient called and stated that she would like to have an update on her results, Patient was advised to call today if she did hear anything back on results. No other information was disclosed. Please advise.

## 2018-06-10 ENCOUNTER — Telehealth: Payer: Self-pay | Admitting: Obstetrics and Gynecology

## 2018-06-10 NOTE — Telephone Encounter (Signed)
This has been taken care of. Please see previous phone message.

## 2018-06-10 NOTE — Telephone Encounter (Signed)
Spoke with patient and per Dr. Logan Bores patient had a complete miscarriage. He would like to see her back in about two weeks to discuss [facility] or if she would like to get pregnant again. I have scheduled patient.

## 2018-06-10 NOTE — Telephone Encounter (Signed)
Patient called requesting ultrasound results from Monday. Thanks

## 2018-06-19 ENCOUNTER — Emergency Department: Payer: Medicaid Other

## 2018-06-19 ENCOUNTER — Emergency Department
Admission: EM | Admit: 2018-06-19 | Discharge: 2018-06-19 | Disposition: A | Discharge status: Home / Self Care | Payer: Medicaid Other | Attending: Emergency Medicine | Admitting: Emergency Medicine

## 2018-06-19 ENCOUNTER — Other Ambulatory Visit: Payer: Self-pay

## 2018-06-19 DIAGNOSIS — S199XXA Unspecified injury of neck, initial encounter: Secondary | ICD-10-CM | POA: Diagnosis present

## 2018-06-19 DIAGNOSIS — Y92009 Unspecified place in unspecified non-institutional (private) residence as the place of occurrence of the external cause: Secondary | ICD-10-CM | POA: Diagnosis not present

## 2018-06-19 DIAGNOSIS — F331 Major depressive disorder, recurrent, moderate: Secondary | ICD-10-CM | POA: Diagnosis not present

## 2018-06-19 DIAGNOSIS — Y9389 Activity, other specified: Secondary | ICD-10-CM | POA: Diagnosis not present

## 2018-06-19 DIAGNOSIS — Y998 Other external cause status: Secondary | ICD-10-CM | POA: Diagnosis not present

## 2018-06-19 DIAGNOSIS — S069X9A Unspecified intracranial injury with loss of consciousness of unspecified duration, initial encounter: Secondary | ICD-10-CM | POA: Diagnosis not present

## 2018-06-19 DIAGNOSIS — R45851 Suicidal ideations: Secondary | ICD-10-CM | POA: Diagnosis not present

## 2018-06-19 LAB — CBC WITH DIFFERENTIAL/PLATELET
Abs Immature Granulocytes: 0.03 10*3/uL (ref 0.00–0.07)
Basophils Absolute: 0.1 10*3/uL (ref 0.0–0.1)
Basophils Relative: 1 %
EOS PCT: 0 %
Eosinophils Absolute: 0 10*3/uL (ref 0.0–0.5)
HCT: 35.3 % — ABNORMAL LOW (ref 36.0–46.0)
HEMOGLOBIN: 11.1 g/dL — AB (ref 12.0–15.0)
Immature Granulocytes: 0 %
LYMPHS PCT: 25 %
Lymphs Abs: 2 10*3/uL (ref 0.7–4.0)
MCH: 28.2 pg (ref 26.0–34.0)
MCHC: 31.4 g/dL (ref 30.0–36.0)
MCV: 89.8 fL (ref 80.0–100.0)
Monocytes Absolute: 0.4 10*3/uL (ref 0.1–1.0)
Monocytes Relative: 5 %
Neutro Abs: 5.6 10*3/uL (ref 1.7–7.7)
Neutrophils Relative %: 69 %
Platelets: 230 10*3/uL (ref 150–400)
RBC: 3.93 MIL/uL (ref 3.87–5.11)
RDW: 12.4 % (ref 11.5–15.5)
WBC: 8.1 10*3/uL (ref 4.0–10.5)
nRBC: 0 % (ref 0.0–0.2)

## 2018-06-19 LAB — COMPREHENSIVE METABOLIC PANEL
ALK PHOS: 47 U/L (ref 38–126)
ALT: 20 U/L (ref 0–44)
AST: 19 U/L (ref 15–41)
Albumin: 4.4 g/dL (ref 3.5–5.0)
Anion gap: 3 — ABNORMAL LOW (ref 5–15)
BUN: 12 mg/dL (ref 6–20)
CALCIUM: 9.2 mg/dL (ref 8.9–10.3)
CO2: 28 mmol/L (ref 22–32)
Chloride: 107 mmol/L (ref 98–111)
Creatinine, Ser: 0.55 mg/dL (ref 0.44–1.00)
GFR calc Af Amer: >60 mL/min (ref >60)
GFR calc non Af Amer: >60 mL/min (ref >60)
Glucose, Bld: 108 mg/dL — ABNORMAL HIGH (ref 70–99)
Potassium: 4.1 mmol/L (ref 3.5–5.1)
Sodium: 138 mmol/L (ref 135–145)
TOTAL PROTEIN: 7.9 g/dL (ref 6.5–8.1)
Total Bilirubin: 0.9 mg/dL (ref 0.3–1.2)

## 2018-06-19 LAB — SALICYLATE LEVEL: Salicylate Lvl: <7 mg/dL (ref 2.8–30.0)

## 2018-06-19 LAB — ACETAMINOPHEN LEVEL: Acetaminophen (Tylenol), Serum: <10 ug/mL — ABNORMAL LOW (ref 10–30)

## 2018-06-19 LAB — ETHANOL: Alcohol, Ethyl (B): <10 mg/dL (ref <10)

## 2018-06-19 LAB — HCG, QUANTITATIVE, PREGNANCY: hCG, Beta Chain, Quant, S: 5 m[IU]/mL — ABNORMAL HIGH (ref <5)

## 2018-06-19 MED ORDER — IOHEXOL 350 MG/ML SOLN
75.0000 mL | Freq: Once | INTRAVENOUS | Status: AC | PRN
  Administered 2018-06-19: 75 mL via INTRAVENOUS

## 2018-06-19 MED ORDER — MIRTAZAPINE 30 MG PO TBDP: 30.0000 mg | ORAL_TABLET | Freq: Every day | ORAL | 0 refills | Status: DC
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
## 2018-06-19 NOTE — ED Provider Notes (Signed)
-----------------------------------------   12:14 PM on 06/19/2018 -----------------------------------------  I took over care of this patient from Dr. Manson Passey.  The patient was pending [facility] tele-psychiatry consultation for evaluation of depression.  [facility] physician has evaluated the patient, and recommends discharge with outpatient follow-up.  He also recommended starting the patient on Remeron so I provided this prescription.  I have also provided referral to [facility].  At this time the patient feels comfortable and would like to go home.  I counseled her on the results of the work-up and the plan of care.  Return precautions given, and she expressed understanding.   Dionne Bucy, MD 06/19/18 1215

## 2018-06-19 NOTE — ED Notes (Signed)
Brief report given to Psychiatrist at this time.

## 2018-06-19 NOTE — Discharge Instructions (Addendum)
Return to the ER for any new or worsening symptoms that concern you.  Follow-up with your primary care doctor.  We have also provided a referral for mental health services.       Interpersonal Violence   Interpersonal Violence aka Domestic Violence is defined as violence between people who have had a personal relationship. For example, someone you have ever dated, been married to or in a domestic partnership with. Someone with whom you have a child in common, or a current  household member.  Does one or more of the following  attempts to cause bodily injury, or intentionally causes bodily injury; places you or a member of your family or household in fear of imminent serious bodily injury; continued harassment that rises to such a level as to inflict substantial emotional distress; or commits any rape or sexual offense  You are not alone. Unfortunately domestic violence is very common. Domestic violence does not go away on its own and tends to get worse over time and more frequent. There are people who can help. There are resources included in these instructions. Evidence can be collected in case you want to notify law enforcement now or in the future. A forensic nurse can take photographs and create a medical/legal document of the incident. If you choose to report to law enforcement, they will request a copy of the chart which we can provide with your permission. We can call in social work or an advocate to help with safety planning and emergency placement in a shelter if you have no other safe options.  THE POLICE CAN HELP YOU:  Get to a safe place away from the violence.  Get information on how the court can help protect you against the violence.  Get necessary belongings from your home for you and your children.  Get copies of police reports about the violence.  File a complaint in criminal court.  Find where local criminal and family courts are located.  [facility] Can  Help You Safety Planning Assistance with Shelter Obtaining a Engineer, maintenance (IT) (50B) Research officer, political party Support Group Environmental consultant with domestic violence related criminal charges Child Youth worker Assistance Enrollment Job Readiness Budget Counseling  Coaching and Mentoring  Call your local domestic violence program for additional information and support.   [facility]   336-641-SAFE Crisis Line 9702815324 [facility] [place]   (475)047-7458 Crisis Line 541-104-2605 [facility] (705)115-7012  National Domestic Violence Abuse Hotline  (843)069-1699  TEXT (310)169-7631 for text crisis support. Please call our offices if you have any questions. [phone]

## 2018-06-19 NOTE — ED Notes (Signed)
Pt being interviewed by Insurance risk surveyor, Efraim Kaufmann, att

## 2018-06-19 NOTE — ED Notes (Signed)
Report given to Susan RN 
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
## 2018-06-19 NOTE — ED Notes (Signed)
SOC set up in room at this time by EDT Vet

## 2018-06-19 NOTE — ED Notes (Addendum)
Pt waiting on consult with Telepsych. Per MD Manson Passey pt is to stay in ED 5 and not dress out and go to [facility]. Pt can have consults in room. Mother at bedside with patient at this time. Pt advised to let RN know if she needs anything.

## 2018-06-19 NOTE — ED Notes (Signed)
[employer] Officers at bedside with pt and mother.
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
## 2018-06-19 NOTE — ED Notes (Signed)
SOC taken to bedside

## 2018-06-21 ENCOUNTER — Encounter: Payer: Medicaid Other | Admitting: Obstetrics and Gynecology

## 2018-06-25 NOTE — SANE Note (Signed)
Follow-up Phone Call  Patient gives verbal consent for a FNE/SANE follow-up phone call in 48-72 hours: No Patient's telephone number: no consent Patient gives verbal consent to leave voicemail at the phone number listed above: No DO NOT CALL between the hours of: No consent to call

## 2018-07-06 ENCOUNTER — Encounter: Payer: Self-pay | Admitting: Obstetrics and Gynecology

## 2018-07-06 ENCOUNTER — Ambulatory Visit: Payer: Medicaid Other | Admitting: Obstetrics and Gynecology

## 2018-07-06 VITALS — BP 103/58 | HR 71 | Ht 66.0 in | Wt 166.3 lb

## 2018-07-06 DIAGNOSIS — N871 Moderate cervical dysplasia: Secondary | ICD-10-CM

## 2018-07-06 DIAGNOSIS — Z3009 Encounter for other general counseling and advice on contraception: Secondary | ICD-10-CM

## 2018-07-06 NOTE — Progress Notes (Signed)
Patient comes in today to discuss birth control. Patient has not been using any form of birth control. Patient had miscarriage in early January. Patient denies having intercourse since her miscarriage.

## 2018-07-06 NOTE — Progress Notes (Signed)
HPI:      [name] is a 28 y.o. [id number] who LMP was Patient's last menstrual period was 07/03/2018.  Subjective:   She presents today with 2 issues to discuss.  She was scheduled to have a LEEP for CIN-2 and did not have it.  She also would like to discuss IUD for birth control.  She has desired this for a long time- prior to her last pregnancy and miscarriage. She is requesting LEEP be rescheduled in the OR and IUD placed at the same time as soon as possible.    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 She does not have any pertinent problems on file. She  has a past surgical history that includes No past surgeries. Her family history is not on file. She  reports that she has never smoked. She has never used smokeless tobacco. She reports that she does not drink alcohol or use drugs. She has a current medication list which includes the following prescription(s): mirtazapine. She has No Known Allergies.       Review of Systems:  Review of Systems  Constitutional: Denied constitutional symptoms, night sweats, recent illness, fatigue, fever, insomnia and weight loss.  Eyes: Denied eye symptoms, eye pain, photophobia, vision change and visual disturbance.  Ears/Nose/Throat/Neck: Denied ear, nose, throat or neck symptoms, hearing loss, nasal discharge, sinus congestion and sore throat.  Cardiovascular: Denied cardiovascular symptoms, arrhythmia, chest pain/pressure, edema, exercise intolerance, orthopnea and palpitations.  Respiratory: Denied pulmonary symptoms, asthma, pleuritic pain, productive sputum, cough, dyspnea and wheezing.  Gastrointestinal: Denied, gastro-esophageal reflux,  melena, nausea and vomiting.  Genitourinary: Denied genitourinary symptoms including symptomatic vaginal discharge, pelvic relaxation issues, and urinary complaints.  Musculoskeletal: Denied musculoskeletal symptoms, stiffness, swelling, muscle weakness and myalgia.  Dermatologic: Denied dermatology symptoms, rash and scar.  Neurologic: Denied neurology symptoms, dizziness, headache, neck pain and syncope.  Psychiatric: Denied psychiatric symptoms, anxiety and depression.  Endocrine: Denied endocrine symptoms including hot flashes and night sweats.   Meds:   Current Outpatient Medications on File Prior to Visit  Medication Sig Dispense Refill  . mirtazapine (REMERON SOL-TAB) 30 MG disintegrating tablet Take 1 tablet (30 mg total) by mouth at bedtime for 30 days. 30 tablet 0   No current facility-administered medications on file prior to visit.     Objective:     Vitals:   07/06/18 0955  BP: (!) 103/58  Pulse: 71                Assessment:    [id number] Patient Active Problem List   Diagnosis Date Noted  . Vaginal discharge 01/26/2018  . Abnormal uterine bleeding (AUB) 01/26/2018  . STD exposure 01/26/2018  . Pap smear of cervix with ASCUS, cannot exclude HGSIL 05/08/2016  . Chlamydia infection affecting pregnancy in first trimester 05/08/2016     1. Dysplasia of cervix, high grade CIN 2   2. Birth control counseling     Patient desires LEEP for CIN-2 as previously discussed in detail with Dr. Greggory Keen.  She is also requesting IUD placement at the time of LEEP while in the OR.   Plan:            1.  Records  reviewed.  Multiple biopsies revealing CIN discussed and noted. Patient's longstanding desire for IUD noted.  2.  Patient to return for preop for LEEP and IUD placement. Orders No orders of the defined types were placed in this encounter.   No orders of the defined types were placed in this encounter.     F/U  Return in about 1 day (around 07/07/2018). I  spent 18 minutes involved in the care of this patient of which greater than 50% was spent discussing CIN, natural course and history, LEEP versus expectant management, forms of birth control including IUD.  Hospital versus office LEEP.  All questions answered.  Elonda Husky, M.D. 07/06/2018 11:18 AM

## 2018-07-07 ENCOUNTER — Ambulatory Visit (INDEPENDENT_AMBULATORY_CARE_PROVIDER_SITE_OTHER): Payer: Medicaid Other | Admitting: Obstetrics and Gynecology

## 2018-07-07 ENCOUNTER — Encounter: Payer: Self-pay | Admitting: Obstetrics and Gynecology

## 2018-07-07 VITALS — BP 114/63 | HR 68 | Ht 66.0 in | Wt 163.9 lb

## 2018-07-07 DIAGNOSIS — Z30014 Encounter for initial prescription of intrauterine contraceptive device: Secondary | ICD-10-CM

## 2018-07-07 DIAGNOSIS — N871 Moderate cervical dysplasia: Secondary | ICD-10-CM | POA: Diagnosis not present

## 2018-07-07 DIAGNOSIS — Z01818 Encounter for other preprocedural examination: Secondary | ICD-10-CM

## 2018-07-07 NOTE — H&P (View-Only) (Signed)
      PRE-OPERATIVE HISTORY AND PHYSICAL EXAM  PCP:  Defrancesco, Martin A, MD Subjective:   HPI:  Tiffany Bowen is a 28 y.o. [id number].  Patient's last menstrual period was 07/03/2018.  She presents today for a pre-op discussion and PE.  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 Patient desires IUD for birth control  Review of Systems:   Constitutional: Denied constitutional symptoms, night sweats, recent illness, fatigue, fever, insomnia and weight loss.  Eyes: Denied eye symptoms, eye pain, photophobia, vision change and visual disturbance.  Ears/Nose/Throat/Neck: Denied ear, nose, throat or neck symptoms, hearing loss, nasal discharge, sinus congestion and sore throat.  Cardiovascular: Denied cardiovascular symptoms, arrhythmia, chest pain/pressure, edema, exercise intolerance, orthopnea and palpitations.  Respiratory: Denied pulmonary symptoms, asthma, pleuritic pain, productive sputum, cough, dyspnea and wheezing.  Gastrointestinal: Denied, gastro-esophageal reflux, melena, nausea and vomiting.  Genitourinary: See HPI for additional information.  Musculoskeletal: Denied musculoskeletal symptoms, stiffness, swelling, muscle weakness and myalgia.  Dermatologic: Denied dermatology symptoms, rash and scar.  Neurologic: Denied neurology symptoms, dizziness, headache, neck pain and syncope.  Psychiatric: Denied psychiatric symptoms, anxiety and depression.  Endocrine: Denied endocrine symptoms including hot flashes and night sweats.   OB History  Gravida Para Term Preterm AB Living  5 2 1 1 2 1  SAB TAB Ectopic Multiple Live Births  1 1     1    # Outcome Date GA Lbr Len/2nd Weight Sex Delivery Anes PTL Lv  5 Gravida           4 TAB 2017          3 Term 05/2014   7 lb 8 oz (3.402 kg) M Vag-Spont   LIV  2 Preterm 2014    M Vag-Spont   FD  1 SAB 2012        ND    Past Medical History:  Diagnosis Date  .  Abdominal pain   . Anemia   . Breastfeeding (infant)   . BV (bacterial vaginosis)   . 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 Post partum depression   . STD (sexually transmitted disease)     Past Surgical History:  Procedure Laterality Date  . NO PAST SURGERIES        SOCIAL HISTORY: Social History   Tobacco Use  Smoking Status Never Smoker  Smokeless Tobacco Never Used   Social History   Substance and Sexual Activity  Alcohol Use No   Social History   Substance and Sexual Activity  Drug Use No    Family History  Problem Relation Age of Onset  . Cancer Neg Hx   . Diabetes Neg Hx   . Heart disease Neg Hx     ALLERGIES:  Patient has no known allergies.  MEDS:   Current Outpatient Medications on File Prior to Visit  Medication Sig Dispense Refill  . mirtazapine (REMERON SOL-TAB) 30 MG disintegrating tablet Take 1 tablet (30 mg total) by mouth at bedtime for 30 days. 30 tablet 0   No current facility-administered medications on file prior to visit.     No orders of the defined types were placed in this encounter.    Physical examination BP 114/63   Pulse 68   Ht 5' 6" (1.676 m)   Wt 163 lb 14.4 oz (74.3 kg)   LMP 07/03/2018   BMI 26.45 kg/m   General NAD,   Conversant  HEENT Atraumatic; Op clear with mmm.  Normo-cephalic. Pupils reactive. Anicteric sclerae  Thyroid/Neck Smooth without nodularity or enlargement. Normal ROM.  Neck Supple.  Skin No rashes, lesions or ulceration. Normal palpated skin turgor. No nodularity.  Breasts: No masses or discharge.  Symmetric.  No axillary adenopathy.  Lungs: Clear to auscultation.No rales or wheezes. Normal Respiratory effort, no retractions.  Heart: NSR.  No murmurs or rubs appreciated. No periferal edema  Abdomen: Soft.  Non-tender.  No masses.  No HSM. No hernia  Extremities: Moves all appropriately.  Normal ROM for age. No lymphadenopathy.  Neuro: Oriented to  PPT.  Normal mood. Normal affect.     Pelvic:   Vulva: Normal appearance.  No lesions.  Vagina: No lesions or abnormalities noted.  Support: Normal pelvic support.  Urethra No masses tenderness or scarring.  Meatus Normal size without lesions or prolapse.  Cervix: Normal ectropion.  No lesions.  Anus: Normal exam.  No lesions.  Perineum: Normal exam.  No lesions.        Bimanual   Uterus: Normal size.  Non-tender.  Mobile.  AV.  Adnexae: No masses.  Non-tender to palpation.  Cul-de-sac: Negative for abnormality.   Assessment:   [id number] Patient Active Problem List   Diagnosis Date Noted  . Vaginal discharge 01/26/2018  . Abnormal uterine bleeding (AUB) 01/26/2018  . STD exposure 01/26/2018  . Pap smear of cervix with ASCUS, cannot exclude HGSIL 05/08/2016  . Chlamydia infection affecting pregnancy in first trimester 05/08/2016    1. Preop examination   2. Dysplasia of cervix, high grade CIN 2      Plan:   Orders: No orders of the defined types were placed in this encounter.    1.  LEEP 2.  IUD insertion

## 2018-07-07 NOTE — H&P (Signed)
PRE-OPERATIVE HISTORY AND PHYSICAL EXAM  PCP:  Defrancesco, Prentice Docker, MD Subjective:   HPI:  Tiffany Bowen is a 28 y.o. [id number].  Patient's last menstrual period was 07/03/2018.  She presents today for a pre-op discussion and PE.  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 Patient desires IUD for birth control  Review of Systems:   Constitutional: Denied constitutional symptoms, night sweats, recent illness, fatigue, fever, insomnia and weight loss.  Eyes: Denied eye symptoms, eye pain, photophobia, vision change and visual disturbance.  Ears/Nose/Throat/Neck: Denied ear, nose, throat or neck symptoms, hearing loss, nasal discharge, sinus congestion and sore throat.  Cardiovascular: Denied cardiovascular symptoms, arrhythmia, chest pain/pressure, edema, exercise intolerance, orthopnea and palpitations.  Respiratory: Denied pulmonary symptoms, asthma, pleuritic pain, productive sputum, cough, dyspnea and wheezing.  Gastrointestinal: Denied, gastro-esophageal reflux, melena, nausea and vomiting.  Genitourinary: See HPI for additional information.  Musculoskeletal: Denied musculoskeletal symptoms, stiffness, swelling, muscle weakness and myalgia.  Dermatologic: Denied dermatology symptoms, rash and scar.  Neurologic: Denied neurology symptoms, dizziness, headache, neck pain and syncope.  Psychiatric: Denied psychiatric symptoms, anxiety and depression.  Endocrine: Denied endocrine symptoms including hot flashes and night sweats.   OB History  Gravida Para Term Preterm AB Living  5 2 1 1 2 1   SAB TAB Ectopic Multiple Live Births  1 1     1     # Outcome Date GA Lbr Len/2nd Weight Sex Delivery Anes PTL Lv  5 Gravida           4 TAB 2017          3 Term 05/2014   7 lb 8 oz (3.402 kg) M Vag-Spont   LIV  2 Preterm 2014    M Vag-Spont   FD  1 SAB 2012        ND    Past Medical History:  Diagnosis Date  .  Abdominal pain   . Anemia   . Breastfeeding (infant)   . BV (bacterial vaginosis)   . 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 Post partum depression   . STD (sexually transmitted disease)     Past Surgical History:  Procedure Laterality Date  . NO PAST SURGERIES        SOCIAL HISTORY: Social History   Tobacco Use  Smoking Status Never Smoker  Smokeless Tobacco Never Used   Social History   Substance and Sexual Activity  Alcohol Use No   Social History   Substance and Sexual Activity  Drug Use No    Family History  Problem Relation Age of Onset  . Cancer Neg Hx   . Diabetes Neg Hx   . Heart disease Neg Hx     ALLERGIES:  Patient has no known allergies.  MEDS:   Current Outpatient Medications on File Prior to Visit  Medication Sig Dispense Refill  . mirtazapine (REMERON SOL-TAB) 30 MG disintegrating tablet Take 1 tablet (30 mg total) by mouth at bedtime for 30 days. 30 tablet 0   No current facility-administered medications on file prior to visit.     No orders of the defined types were placed in this encounter.    Physical examination BP 114/63   Pulse 68   Ht 5\' 6"  (1.676 m)   Wt 163 lb 14.4 oz (74.3 kg)   LMP 07/03/2018   BMI 26.45 kg/m   General NAD,  Conversant  HEENT Atraumatic; Op clear with mmm.  Normo-cephalic. Pupils reactive. Anicteric sclerae  Thyroid/Neck Smooth without nodularity or enlargement. Normal ROM.  Neck Supple.  Skin No rashes, lesions or ulceration. Normal palpated skin turgor. No nodularity.  Breasts: No masses or discharge.  Symmetric.  No axillary adenopathy.  Lungs: Clear to auscultation.No rales or wheezes. Normal Respiratory effort, no retractions.  Heart: NSR.  No murmurs or rubs appreciated. No periferal edema  Abdomen: Soft.  Non-tender.  No masses.  No HSM. No hernia  Extremities: Moves all appropriately.  Normal ROM for age. No lymphadenopathy.  Neuro: Oriented to  PPT.  Normal mood. Normal affect.     Pelvic:   Vulva: Normal appearance.  No lesions.  Vagina: No lesions or abnormalities noted.  Support: Normal pelvic support.  Urethra No masses tenderness or scarring.  Meatus Normal size without lesions or prolapse.  Cervix: Normal ectropion.  No lesions.  Anus: Normal exam.  No lesions.  Perineum: Normal exam.  No lesions.        Bimanual   Uterus: Normal size.  Non-tender.  Mobile.  AV.  Adnexae: No masses.  Non-tender to palpation.  Cul-de-sac: Negative for abnormality.   Assessment:   [id number] Patient Active Problem List   Diagnosis Date Noted  . Vaginal discharge 01/26/2018  . Abnormal uterine bleeding (AUB) 01/26/2018  . STD exposure 01/26/2018  . Pap smear of cervix with ASCUS, cannot exclude HGSIL 05/08/2016  . Chlamydia infection affecting pregnancy in first trimester 05/08/2016    1. Preop examination   2. Dysplasia of cervix, high grade CIN 2      Plan:   Orders: No orders of the defined types were placed in this encounter.    1.  LEEP 2.  IUD insertion

## 2018-07-07 NOTE — Progress Notes (Signed)
Pt stated that she is doing well. No problems.  

## 2018-07-07 NOTE — Progress Notes (Signed)
PRE-OPERATIVE HISTORY AND PHYSICAL EXAM  PCP:  Defrancesco, Prentice Docker, MD Subjective:   HPI:  Tiffany Bowen is a 28 y.o. [id number].  Patient's last menstrual period was 07/03/2018.  She presents today for a pre-op discussion and PE.  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 Patient desires IUD for birth control  Review of Systems:   Constitutional: Denied constitutional symptoms, night sweats, recent illness, fatigue, fever, insomnia and weight loss.  Eyes: Denied eye symptoms, eye pain, photophobia, vision change and visual disturbance.  Ears/Nose/Throat/Neck: Denied ear, nose, throat or neck symptoms, hearing loss, nasal discharge, sinus congestion and sore throat.  Cardiovascular: Denied cardiovascular symptoms, arrhythmia, chest pain/pressure, edema, exercise intolerance, orthopnea and palpitations.  Respiratory: Denied pulmonary symptoms, asthma, pleuritic pain, productive sputum, cough, dyspnea and wheezing.  Gastrointestinal: Denied, gastro-esophageal reflux, melena, nausea and vomiting.  Genitourinary: See HPI for additional information.  Musculoskeletal: Denied musculoskeletal symptoms, stiffness, swelling, muscle weakness and myalgia.  Dermatologic: Denied dermatology symptoms, rash and scar.  Neurologic: Denied neurology symptoms, dizziness, headache, neck pain and syncope.  Psychiatric: Denied psychiatric symptoms, anxiety and depression.  Endocrine: Denied endocrine symptoms including hot flashes and night sweats.   OB History  Gravida Para Term Preterm AB Living  5 2 1 1 2 1   SAB TAB Ectopic Multiple Live Births  1 1     1     # Outcome Date GA Lbr Len/2nd Weight Sex Delivery Anes PTL Lv  5 Gravida           4 TAB 2017          3 Term 05/2014   7 lb 8 oz (3.402 kg) M Vag-Spont   LIV  2 Preterm 2014    M Vag-Spont   FD  1 SAB 2012        ND    Past Medical History:  Diagnosis Date  .  Abdominal pain   . Anemia   . Breastfeeding (infant)   . BV (bacterial vaginosis)   . 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 Post partum depression   . STD (sexually transmitted disease)     Past Surgical History:  Procedure Laterality Date  . NO PAST SURGERIES        SOCIAL HISTORY: Social History   Tobacco Use  Smoking Status Never Smoker  Smokeless Tobacco Never Used   Social History   Substance and Sexual Activity  Alcohol Use No   Social History   Substance and Sexual Activity  Drug Use No    Family History  Problem Relation Age of Onset  . Cancer Neg Hx   . Diabetes Neg Hx   . Heart disease Neg Hx     ALLERGIES:  Patient has no known allergies.  MEDS:   Current Outpatient Medications on File Prior to Visit  Medication Sig Dispense Refill  . mirtazapine (REMERON SOL-TAB) 30 MG disintegrating tablet Take 1 tablet (30 mg total) by mouth at bedtime for 30 days. 30 tablet 0   No current facility-administered medications on file prior to visit.     No orders of the defined types were placed in this encounter.    Physical examination BP 114/63   Pulse 68   Ht 5\' 6"  (1.676 m)   Wt 163 lb 14.4 oz (74.3 kg)   LMP 07/03/2018   BMI 26.45 kg/m   General NAD, Conversant  HEENT Atraumatic; Op clear with mmm.  Normo-cephalic. Pupils reactive. Anicteric sclerae  Thyroid/Neck Smooth without nodularity or enlargement. Normal ROM.  Neck Supple.  Skin No rashes, lesions or ulceration. Normal palpated skin turgor. No nodularity.  Breasts: No masses or discharge.  Symmetric.  No axillary adenopathy.  Lungs: Clear to auscultation.No rales or wheezes. Normal Respiratory effort, no retractions.  Heart: NSR.  No murmurs or rubs appreciated. No periferal edema  Abdomen: Soft.  Non-tender.  No masses.  No HSM. No hernia  Extremities: Moves all appropriately.  Normal ROM for age. No lymphadenopathy.  Neuro: Oriented to  PPT.  Normal mood. Normal affect.     Pelvic:   Vulva: Normal appearance.  No lesions.  Vagina: No lesions or abnormalities noted.  Support: Normal pelvic support.  Urethra No masses tenderness or scarring.  Meatus Normal size without lesions or prolapse.  Cervix: Normal ectropion.  No lesions.  Anus: Normal exam.  No lesions.  Perineum: Normal exam.  No lesions.        Bimanual   Uterus: Normal size.  Non-tender.  Mobile.  AV.  Adnexae: No masses.  Non-tender to palpation.  Cul-de-sac: Negative for abnormality.   Assessment:   [id number] Patient Active Problem List   Diagnosis Date Noted  . Vaginal discharge 01/26/2018  . Abnormal uterine bleeding (AUB) 01/26/2018  . STD exposure 01/26/2018  . Pap smear of cervix with ASCUS, cannot exclude HGSIL 05/08/2016  . Chlamydia infection affecting pregnancy in first trimester 05/08/2016    1. Preop examination   2. Dysplasia of cervix, high grade CIN 2      Plan:   Orders: No orders of the defined types were placed in this encounter.    1.  LEEP 2.  IUD insertion  We discussed the risks and benefits of LEEP versus expectant management.  Possibility of incompetent cervix infertility discussed.  Stability of recurrent HPV and cervical dysplasia discussed.  Bleeding infection and anesthesia as risks reviewed.  All questions answered.  I spent 26 minutes involved in the care of this patient of which greater than 50% was spent discussing CIN and use of LEEP, risks and benefits of surgery, use of IUD for birth control and cycle control, follow-up after LEEP including systematic Pap smears.  All questions answered.  Elonda Husky, M.D. 07/07/2018 11:12 AM

## 2018-07-08 ENCOUNTER — Other Ambulatory Visit: Payer: Self-pay

## 2018-07-08 ENCOUNTER — Encounter
Admission: RE | Admit: 2018-07-08 | Discharge: 2018-07-08 | Disposition: A | Discharge status: Home / Self Care | Payer: Medicaid Other | Source: Ambulatory Visit | Attending: Obstetrics and Gynecology | Admitting: Obstetrics and Gynecology

## 2018-07-08 ENCOUNTER — Inpatient Hospital Stay: Admission: RE | Admit: 2018-07-08 | Payer: Medicaid Other | Source: Ambulatory Visit

## 2018-07-08 DIAGNOSIS — Z01812 Encounter for preprocedural laboratory examination: Secondary | ICD-10-CM | POA: Diagnosis not present

## 2018-07-08 HISTORY — DX: Anxiety disorder, unspecified: F41.9

## 2018-07-08 LAB — CBC
HCT: 35.3 % — ABNORMAL LOW (ref 36.0–46.0)
Hemoglobin: 11 g/dL — ABNORMAL LOW (ref 12.0–15.0)
MCH: 28.5 pg (ref 26.0–34.0)
MCHC: 31.2 g/dL (ref 30.0–36.0)
MCV: 91.5 fL (ref 80.0–100.0)
PLATELETS: 229 10*3/uL (ref 150–400)
RBC: 3.86 MIL/uL — AB (ref 3.87–5.11)
RDW: 13.2 % (ref 11.5–15.5)
WBC: 5.7 10*3/uL (ref 4.0–10.5)
nRBC: 0 % (ref 0.0–0.2)

## 2018-07-08 LAB — TYPE AND SCREEN
ABO/RH(D): O POS
Antibody Screen: NEGATIVE
Extend sample reason: UNDETERMINED

## 2018-07-08 LAB — HCG, QUANTITATIVE, PREGNANCY: HCG, BETA CHAIN, QUANT, S: 1 m[IU]/mL (ref <5)

## 2018-07-08 NOTE — Patient Instructions (Signed)
Your procedure is scheduled on: 07-11-18 MONDAY Report to Same Day Surgery 2nd floor medical mall [facility] Entrance-take elevator on left to 2nd floor.  Check in with surgery information desk.) To find out your arrival time please call [phone] between 1PM - 3PM on 07-08-18 FRIDAY  Remember: Instructions that are not followed completely may result in serious medical risk, up to and including death, or upon the discretion of your surgeon and anesthesiologist your surgery may need to be rescheduled.    _x___ 1. Do not eat food after midnight the night before your procedure. NO GUM OR CANDY AFTER MIDNIGHT. You may drink clear liquids up to 2 hours before you are scheduled to arrive at the hospital for your procedure.  Do not drink clear liquids within 2 hours of your scheduled arrival to the hospital.  Clear liquids include  --Water or Apple juice without pulp  --Clear carbohydrate beverage such as ClearFast or Gatorade  --Black Coffee or Clear Tea (No milk, no creamers, do not add anything to the coffee or Te   ____Ensure clear carbohydrate drink on the way to the hospital for bariatric patients  _X___Ensure clear carbohydrate drink 3 hours before surgery    __x__ 2. No Alcohol for 24 hours before or after surgery.   __x__3. No Smoking or e-cigarettes for 24 prior to surgery.  Do not use any chewable tobacco products for at least 6 hour prior to surgery   ____  4. Bring all medications with you on the day of surgery if instructed.    __x__ 5. Notify your doctor if there is any change in your medical condition     (cold, fever, infections).    x___6. On the morning of surgery brush your teeth with toothpaste and water.  You may rinse your mouth with mouth wash if you wish.  Do not swallow any toothpaste or mouthwash.   Do not wear jewelry, make-up, hairpins, clips or nail polish.  Do not wear lotions, powders, or perfumes. You may wear deodorant.  Do not shave 48 hours prior to  surgery. Men may shave face and neck.  Do not bring valuables to the hospital.    [facility] is not responsible for any belongings or valuables.               Contacts, dentures or bridgework may not be worn into surgery.  Leave your suitcase in the car. After surgery it may be brought to your room.  For patients admitted to the hospital, discharge time is determined by your treatment team.  _  Patients discharged the day of surgery will not be allowed to drive home.  You will need someone to drive you home and stay with you the night of your procedure.    Please read over the following fact sheets that you were given:   [facility] Preparing for Surgery   ____ Take anti-hypertensive listed below, cardiac, seizure, asthma, anti-reflux and psychiatric medicines. These include:  1. NONE  2.  3.  4.  5.  6.  ____Fleets enema or Magnesium Citrate as directed.   ____ Use CHG Soap or sage wipes as directed on instruction sheet   ____ Use inhalers on the day of surgery and bring to hospital day of surgery  ____ Stop Metformin and Janumet 2 days prior to surgery.    ____ Take 1/2 of usual insulin dose the night before surgery and none on the morning  surgery.   ____ Follow  recommendations from Cardiologist, Pulmonologist or PCP regarding stopping Aspirin, Coumadin, Plavix ,Eliquis, Effient, or Pradaxa, and Pletal.  X____Stop Anti-inflammatories such as Advil, Aleve, Ibuprofen, Motrin, Naproxen, Naprosyn, Goodies powders or aspirin products NOW-OK to take Tylenol    ____ Stop supplements until after surgery.     ____ Bring C-Pap to the hospital.

## 2018-07-11 ENCOUNTER — Ambulatory Visit: Payer: Medicaid Other | Admitting: Certified Registered Nurse Anesthetist

## 2018-07-11 ENCOUNTER — Ambulatory Visit
Admission: RE | Admit: 2018-07-11 | Discharge: 2018-07-11 | Disposition: A | Discharge status: Home / Self Care | Payer: Medicaid Other | Attending: Obstetrics and Gynecology | Admitting: Obstetrics and Gynecology

## 2018-07-11 ENCOUNTER — Encounter
Admission: RE | Disposition: A | Discharge status: Home / Self Care | Payer: Self-pay | Source: Home / Self Care | Attending: Obstetrics and Gynecology

## 2018-07-11 ENCOUNTER — Other Ambulatory Visit: Payer: Self-pay

## 2018-07-11 DIAGNOSIS — F419 Anxiety disorder, unspecified: Secondary | ICD-10-CM | POA: Diagnosis not present

## 2018-07-11 DIAGNOSIS — F329 Major depressive disorder, single episode, unspecified: Secondary | ICD-10-CM | POA: Insufficient documentation

## 2018-07-11 DIAGNOSIS — N879 Dysplasia of cervix uteri, unspecified: Secondary | ICD-10-CM | POA: Diagnosis not present

## 2018-07-11 DIAGNOSIS — N871 Moderate cervical dysplasia: Secondary | ICD-10-CM | POA: Diagnosis present

## 2018-07-11 DIAGNOSIS — Z30430 Encounter for insertion of intrauterine contraceptive device: Secondary | ICD-10-CM | POA: Diagnosis not present

## 2018-07-11 DIAGNOSIS — Z79899 Other long term (current) drug therapy: Secondary | ICD-10-CM | POA: Diagnosis not present

## 2018-07-11 HISTORY — PX: LEEP: SHX91

## 2018-07-11 HISTORY — PX: INTRAUTERINE DEVICE (IUD) INSERTION: SHX5877

## 2018-07-11 SURGERY — LEEP (LOOP ELECTROSURGICAL EXCISION PROCEDURE)
Anesthesia: General | Site: Uterus

## 2018-07-11 MED ORDER — ONDANSETRON HCL 4 MG/2ML IJ SOLN
INTRAMUSCULAR | Status: DC | PRN
  Administered 2018-07-11: 4 mg via INTRAVENOUS

## 2018-07-11 MED ORDER — IODINE STRONG (LUGOLS) 5 % PO SOLN
ORAL | Status: DC | PRN
  Administered 2018-07-11: 0.2 mL via ORAL

## 2018-07-11 MED ORDER — KETOROLAC TROMETHAMINE 30 MG/ML IJ SOLN
INTRAMUSCULAR | Status: DC | PRN
  Administered 2018-07-11: 30 mg via INTRAVENOUS

## 2018-07-11 MED ORDER — DEXMEDETOMIDINE HCL 200 MCG/2ML IV SOLN
INTRAVENOUS | Status: DC | PRN
  Administered 2018-07-11: 4 ug via INTRAVENOUS
  Administered 2018-07-11: 8 ug via INTRAVENOUS

## 2018-07-11 MED ORDER — FAMOTIDINE 20 MG PO TABS
20.0000 mg | ORAL_TABLET | Freq: Once | ORAL | Status: AC
  Administered 2018-07-11: 20 mg via ORAL

## 2018-07-11 MED ORDER — FERRIC SUBSULFATE 259 MG/GM EX SOLN
CUTANEOUS | Status: AC
  Filled 2018-07-11: qty 8

## 2018-07-11 MED ORDER — PROPOFOL 10 MG/ML IV BOLUS
INTRAVENOUS | Status: AC
  Filled 2018-07-11: qty 20

## 2018-07-11 MED ORDER — LIDOCAINE HCL (CARDIAC) PF 100 MG/5ML IV SOSY
PREFILLED_SYRINGE | INTRAVENOUS | Status: DC | PRN
  Administered 2018-07-11: 60 mg via INTRAVENOUS

## 2018-07-11 MED ORDER — DEXAMETHASONE SODIUM PHOSPHATE 10 MG/ML IJ SOLN
INTRAMUSCULAR | Status: AC
  Filled 2018-07-11: qty 1

## 2018-07-11 MED ORDER — FAMOTIDINE 20 MG PO TABS
ORAL_TABLET | ORAL | Status: AC
  Administered 2018-07-11: 20 mg via ORAL
  Filled 2018-07-11: qty 1

## 2018-07-11 MED ORDER — FENTANYL CITRATE (PF) 100 MCG/2ML IJ SOLN
INTRAMUSCULAR | Status: DC | PRN
  Administered 2018-07-11: 50 ug via INTRAVENOUS
  Administered 2018-07-11 (×2): 25 ug via INTRAVENOUS

## 2018-07-11 MED ORDER — KETOROLAC TROMETHAMINE 30 MG/ML IJ SOLN
INTRAMUSCULAR | Status: AC
  Filled 2018-07-11: qty 1

## 2018-07-11 MED ORDER — PROPOFOL 10 MG/ML IV BOLUS
INTRAVENOUS | Status: DC | PRN
  Administered 2018-07-11: 30 mg via INTRAVENOUS
  Administered 2018-07-11: 150 mg via INTRAVENOUS

## 2018-07-11 MED ORDER — MIDAZOLAM HCL 2 MG/2ML IJ SOLN
INTRAMUSCULAR | Status: AC
  Filled 2018-07-11: qty 2

## 2018-07-11 MED ORDER — HYDROMORPHONE HCL 1 MG/ML IJ SOLN: 0.2500 mg | INTRAMUSCULAR | Status: DC | PRN

## 2018-07-11 MED ORDER — ONDANSETRON HCL 4 MG/2ML IJ SOLN
INTRAMUSCULAR | Status: AC
  Filled 2018-07-11: qty 2

## 2018-07-11 MED ORDER — LIDOCAINE HCL (PF) 2 % IJ SOLN
INTRAMUSCULAR | Status: AC
  Filled 2018-07-11: qty 10

## 2018-07-11 MED ORDER — LIDOCAINE-EPINEPHRINE 1 %-1:100000 IJ SOLN
INTRAMUSCULAR | Status: AC
  Filled 2018-07-11: qty 1

## 2018-07-11 MED ORDER — LIDOCAINE-EPINEPHRINE 1 %-1:100000 IJ SOLN
INTRAMUSCULAR | Status: DC | PRN
  Administered 2018-07-11: 8 mL

## 2018-07-11 MED ORDER — IODINE STRONG (LUGOLS) 5 % PO SOLN
ORAL | Status: AC
  Filled 2018-07-11: qty 1

## 2018-07-11 MED ORDER — MIDAZOLAM HCL 2 MG/2ML IJ SOLN
INTRAMUSCULAR | Status: DC | PRN
  Administered 2018-07-11: 2 mg via INTRAVENOUS

## 2018-07-11 MED ORDER — LACTATED RINGERS IV SOLN
INTRAVENOUS | Status: DC
  Administered 2018-07-11: 07:00:00 via INTRAVENOUS

## 2018-07-11 MED ORDER — FENTANYL CITRATE (PF) 100 MCG/2ML IJ SOLN
INTRAMUSCULAR | Status: AC
  Filled 2018-07-11: qty 2

## 2018-07-11 MED ORDER — DEXAMETHASONE SODIUM PHOSPHATE 10 MG/ML IJ SOLN
INTRAMUSCULAR | Status: DC | PRN
  Administered 2018-07-11: 8 mg via INTRAVENOUS

## 2018-07-11 MED ORDER — LACTATED RINGERS IV SOLN: INTRAVENOUS | Status: DC

## 2018-07-11 MED ORDER — PROMETHAZINE HCL 25 MG/ML IJ SOLN: 6.2500 mg | INTRAMUSCULAR | Status: DC | PRN

## 2018-07-11 SURGICAL SUPPLY — 31 items
APPLICATOR COTTON TIP 6 STRL (MISCELLANEOUS) ×2 IMPLANT
APPLICATOR COTTON TIP 6IN STRL (MISCELLANEOUS) ×8 IMPLANT
APPLICATOR SWAB PROCTO LG 16IN (MISCELLANEOUS) ×8 IMPLANT
CANISTER SUCT 1200ML W/VALVE (MISCELLANEOUS) ×4 IMPLANT
CATH ROBINSON RED A/P 16FR (CATHETERS) ×4 IMPLANT
DRAPE UNDER BUTTOCK W/FLU (DRAPES) ×4 IMPLANT
DRSG TELFA 3X8 NADH (GAUZE/BANDAGES/DRESSINGS) ×4 IMPLANT
ELECT LEEP LOOP 1.0CM .7CM (MISCELLANEOUS) ×4
ELECT LEEP LOOP 20X10 R2010 (MISCELLANEOUS) ×8
ELECT LOOP 1.0X1.0CM R1010 (MISCELLANEOUS) ×4
ELECT REM PT RETURN 9FT ADLT (ELECTROSURGICAL) ×4
ELECTRODE LEEP LOOP 1.0CM .7CM (MISCELLANEOUS) ×2 IMPLANT
ELECTRODE LEP LOOP 20X10 R2010 (MISCELLANEOUS) ×4 IMPLANT
ELECTRODE LOOP 1.0X1.0CM R1010 (MISCELLANEOUS) ×2 IMPLANT
ELECTRODE REM PT RTRN 9FT ADLT (ELECTROSURGICAL) ×2 IMPLANT
GLOVE BIOGEL PI ORTHO PRO 7.5 (GLOVE) ×2
GLOVE INDICATOR 7.0 STRL GRN (GLOVE) ×4 IMPLANT
GLOVE PI ORTHO PRO STRL 7.5 (GLOVE) ×2 IMPLANT
GOWN STRL REUS W/ TWL LRG LVL3 (GOWN DISPOSABLE) ×4 IMPLANT
GOWN STRL REUS W/TWL LRG LVL3 (GOWN DISPOSABLE) ×4
HANDLE YANKAUER SUCT BULB TIP (MISCELLANEOUS) ×4 IMPLANT
KIT TURNOVER CYSTO (KITS) ×4 IMPLANT
LABEL OR SOLS (LABEL) ×4 IMPLANT
NEEDLE SPNL 22GX3.5 QUINCKE BK (NEEDLE) ×4 IMPLANT
PACK DNC HYST (MISCELLANEOUS) ×4 IMPLANT
PAD PREP 24X41 OB/GYN DISP (PERSONAL CARE ITEMS) ×4 IMPLANT
PENCIL ELECTRO HAND CTR (MISCELLANEOUS) ×4 IMPLANT
SOL PREP PVP 2OZ (MISCELLANEOUS) ×4
SOLUTION PREP PVP 2OZ (MISCELLANEOUS) ×2 IMPLANT
STRAW SMOKE EVAC LEEP 6150 NON (MISCELLANEOUS) ×4 IMPLANT
SYR 10ML LL (SYRINGE) ×4 IMPLANT

## 2018-07-11 NOTE — Progress Notes (Signed)
   07/11/18 0700  Clinical Encounter Type  Visited With Patient and family together  Visit Type Psychological support;Spiritual support;Social support;Pre-op  Spiritual Encounters  Spiritual Needs Prayer;Emotional;Grief support  Stress Factors  Patient Stress Factors Loss;Major life changes  Family Stress Factors None identified   Ch met with pt and mother pre-op. Ch noticed that pt was nervous about procedure and offered prayer. Pt shared w/ anesthesiologist pre-op that she recently had a miscarriage in Jan. Ch remained present while pt received consult from anesthesiologist. Ch provided words of encouragement and was sorry for the recent loss that the pt experienced. Pt shared that she should be going home today and not hv to be admitted. No further needs at this time.

## 2018-07-11 NOTE — Anesthesia Procedure Notes (Signed)
Procedure Name: LMA Insertion Date/Time: 07/11/2018 7:48 AM Performed by: Dava Najjar, CRNA Pre-anesthesia Checklist: Patient identified, Emergency Drugs available, Suction available and Patient being monitored Patient Re-evaluated:Patient Re-evaluated prior to induction Oxygen Delivery Method: Circle system utilized Preoxygenation: Pre-oxygenation with 100% oxygen Induction Type: IV induction Ventilation: Mask ventilation without difficulty LMA: LMA inserted LMA Size: 3.5 Number of attempts: 1 Placement Confirmation: positive ETCO2 and breath sounds checked- equal and bilateral Tube secured with: Tape Dental Injury: Teeth and Oropharynx as per pre-operative assessment

## 2018-07-11 NOTE — Anesthesia Preprocedure Evaluation (Signed)
Anesthesia Evaluation  Patient identified by MRN, date of birth, ID band Patient awake    Reviewed: Allergy & Precautions, H&P , NPO status , Patient's Chart, lab work & pertinent test results  Airway Mallampati: III  TM Distance: >3 FB Neck ROM: full    Dental  (+) Teeth Intact   Pulmonary neg pulmonary ROS,           Cardiovascular (-) hypertensionnegative cardio ROS       Neuro/Psych PSYCHIATRIC DISORDERS Anxiety Depression negative neurological ROS     GI/Hepatic negative GI ROS, Neg liver ROS,   Endo/Other  negative endocrine ROS  Renal/GU      Musculoskeletal   Abdominal   Peds  Hematology  (+) Blood dyscrasia, anemia ,   Anesthesia Other Findings Past Medical History: No date: Abdominal pain No date: Anemia No date: Anxiety No date: Breastfeeding (infant) No date: BV (bacterial vaginosis) 11/23/2013: HGSIL (high grade squamous intraepithelial dysplasia)     Comment:  cin 2 01/18/2014-  No date: Post partum depression No date: STD (sexually transmitted disease)  Past Surgical History: No date: NO PAST SURGERIES  BMI    Body Mass Index:  26.45 kg/m      Reproductive/Obstetrics negative OB ROS                             Anesthesia Physical Anesthesia Plan  ASA: II  Anesthesia Plan: General LMA   Post-op Pain Management:    Induction:   PONV Risk Score and Plan: Dexamethasone, Ondansetron, Midazolam and Treatment may vary due to age or medical condition  Airway Management Planned:   Additional Equipment:   Intra-op Plan:   Post-operative Plan:   Informed Consent: I have reviewed the patients History and Physical, chart, labs and discussed the procedure including the risks, benefits and alternatives for the proposed anesthesia with the patient or authorized representative who has indicated his/her understanding and acceptance.     Dental Advisory  Given  Plan Discussed with: Anesthesiologist and CRNA  Anesthesia Plan Comments:         Anesthesia Quick Evaluation

## 2018-07-11 NOTE — Transfer of Care (Signed)
Immediate Anesthesia Transfer of Care Note  Patient: LESTA CARTA  Procedure(s) Performed: LOOP ELECTROSURGICAL EXCISION PROCEDURE (LEEP) (N/A Cervix) INTRAUTERINE DEVICE (IUD) INSERTION Lot # [id number] Exp date Apr 2022 (N/A Uterus)  Patient Location: PACU  Anesthesia Type:General  Level of Consciousness: drowsy  Airway & Oxygen Therapy: Patient Spontanous Breathing and Patient connected to face mask oxygen  Post-op Assessment: Report given to RN and Post -op Vital signs reviewed and stable  Post vital signs: Reviewed and stable  Last Vitals:  Vitals Value Taken Time  BP 95/54 07/11/2018  8:33 AM  Temp 36.3 C 07/11/2018  8:33 AM  Pulse 61 07/11/2018  8:40 AM  Resp 13 07/11/2018  8:40 AM  SpO2 100 % 07/11/2018  8:40 AM  Vitals shown include unvalidated device data.  Last Pain:  Vitals:   07/11/18 0833  TempSrc:   PainSc: Asleep         Complications: No apparent anesthesia complications

## 2018-07-11 NOTE — Interval H&P Note (Signed)
History and Physical Interval Note:  07/11/2018 7:43 AM  Tiffany Bowen  has presented today for surgery, with the diagnosis of DYSPLASIA OF CERVIX, HIGH GRADE CIN2, BIRTH CONTROL  The various methods of treatment have been discussed with the patient and family. After consideration of risks, benefits and other options for treatment, the patient has consented to  Procedure(s): LOOP ELECTROSURGICAL EXCISION PROCEDURE (LEEP) (N/A) INTRAUTERINE DEVICE (IUD) INSERTION (N/A) as a surgical intervention .  The patient's history has been reviewed, patient examined, no change in status, stable for surgery.  I have reviewed the patient's chart and labs.  Questions were answered to the patient's satisfaction.     Brennan Bailey

## 2018-07-11 NOTE — Discharge Instructions (Signed)
Loop Electrosurgical Excision Procedure Loop electrosurgical excision procedure (LEEP) is the cutting and removal (excision) of part of the cervix. The cervix is the bottom part of the uterus that opens into the vagina. The tissue that is removed from the cervix is then examined to see if there are precancerous cells or cancer cells present. LEEP may be done when:  You have abnormal bleeding from your cervix.  You have an abnormal Pap test result.  Your health care provider finds an abnormality on your cervix during a pelvic exam. LEEP typically only takes a few minutes and is often done in the health care provider's office. The procedure is safe for women who are trying to get pregnant. However, the procedure is usually not done during a menstrual period or during pregnancy. Tell a health care provider about:  Any allergies you have.  All medicines you are taking, including vitamins, herbs, eye drops, creams, and over-the-counter medicines.  Any problems you or family members have had with anesthetic medicines.  Any blood disorders you have.  Any surgeries you have had.  Any medical conditions you have, including current or past vaginal infections, such as herpes or sexually transmitted infections (STIs).  Whether you are pregnant or may be pregnant. What are the risks? Generally, this is a safe procedure. However, problems may occur, including:  Infection.  Bleeding.  Allergic reactions to medicines.  Changes or scarring in the cervix.  Increased risk of early (preterm) labor in future pregnancies. What happens before the procedure?  Ask your health care provider about: ? Changing or stopping your regular medicines. This is especially important if you are taking diabetes medicines or blood thinners. ? Taking medicines such as aspirin and ibuprofen. These medicines can thin your blood. Do not take these medicines before your procedure if your health care provider instructs  you not to.  Your health care provider may recommend that you take pain medicine before the procedure.  Plan to have someone take you home after the procedure. What happens during the procedure?  An instrument called a speculum will be placed in your vagina. This will allow your health care provider to see the cervix.  You will be given a medicine to numb the area (local anesthetic). The medicine will be injected into your cervix and the surrounding area.  A solution will be applied to your cervix. This solution will help the health care provider find the abnormal cells that need to be removed.  A thin wire loop will be passed through your vagina. The wire will be used to burn (cauterize) the cervical tissue with an electrical current.  The abnormal cervical tissue will be removed.  Any open blood vessels will be cauterized to prevent bleeding.  A paste may be applied to the cauterized area of your cervix to help prevent bleeding.  The sample of cervical tissue will be examined under a microscope. The procedure may vary among health care providers and hospitals. What happens after the procedure?  You may have mild abdominal cramping.  You may have a small amount of bleeding (spotting) from the vagina.  You may have a dark-colored discharge coming from your vagina. This is from the paste that was used on the cervix to prevent bleeding.  Your health care provider may recommend pelvic rest. Pelvic rest generally means avoiding sex and not putting anything in the vagina, such as tampons, creams, and douches.  It is your responsibility to get your test results. Ask your health care  provider or the department performing the test when your results will be ready. This information is not intended to replace advice given to you by your health care provider. Make sure you discuss any questions you have with your health care provider. Document Released: 08/01/2002 Document Revised: 06/07/2015  Document Reviewed: 03/25/2015 Elsevier Interactive Patient Education  2019 [employer]  AMBULATORY SURGERY  DISCHARGE INSTRUCTIONS   1) The drugs that you were given will stay in your system until tomorrow so for the next 24 hours you should not:  A) Drive an automobile B) Make any legal decisions C) Drink any alcoholic beverage   2) You may resume regular meals tomorrow.  Today it is better to start with liquids and gradually work up to solid foods.  You may eat anything you prefer, but it is better to start with liquids, then soup and crackers, and gradually work up to solid foods.   3) Please notify your doctor immediately if you have any unusual bleeding, trouble breathing, redness and pain at the surgery site, drainage, fever, or pain not relieved by medication.    4) Additional Instructions:        Please contact your physician with any problems or Same Day Surgery at [phone], Monday through Friday 6 am to 4 pm, or [place] at [facility] number at [phone].

## 2018-07-11 NOTE — Anesthesia Post-op Follow-up Note (Signed)
Anesthesia QCDR form completed.        

## 2018-07-11 NOTE — Anesthesia Postprocedure Evaluation (Signed)
Anesthesia Post Note  Patient: Tiffany Bowen  Procedure(s) Performed: LOOP ELECTROSURGICAL EXCISION PROCEDURE (LEEP) (N/A Cervix) INTRAUTERINE DEVICE (IUD) INSERTION Lot # [id number] Exp date Apr 2022 (N/A Uterus)  Patient location during evaluation: PACU Anesthesia Type: General Level of consciousness: awake and alert Pain management: pain level controlled Vital Signs Assessment: post-procedure vital signs reviewed and stable Respiratory status: spontaneous breathing, nonlabored ventilation and respiratory function stable Cardiovascular status: blood pressure returned to baseline and stable Postop Assessment: no apparent nausea or vomiting Anesthetic complications: no     Last Vitals:  Vitals:   07/11/18 0918 07/11/18 0954  BP: (!) 111/55 104/71  Pulse: (!) 54 (!) 57  Resp: 18 18  Temp: 36.7 C   SpO2: 100% 100%    Last Pain:  Vitals:   07/11/18 0954  TempSrc:   PainSc: 0-No pain                 Jovita Gamma

## 2018-07-11 NOTE — Op Note (Signed)
    OPERATIVE NOTE 07/11/2018 8:42 AM  PRE-OPERATIVE DIAGNOSIS:  1) DYSPLASIA OF CERVIX, HIGH GRADE CIN2, BIRTH CONTROL  POST-OPERATIVE DIAGNOSIS:  SAME  OPERATION:  LEEP and IUD (Mirena) insertion  SURGEON(S): Surgeon(s) and Role:    Linzie Collin, MD - Primary   ANESTHESIA: General  ESTIMATED BLOOD LOSS: 5 mL   SPECIMEN:  ID Type Source Tests Collected by Time Destination  1 : Posterior Cervix Tissue [facility] biopsy SURGICAL PATHOLOGY Linzie Collin, MD 07/11/2018 [phone]   2 : Anterior Cervix Tissue [facility] biopsy SURGICAL PATHOLOGY Linzie Collin, MD 07/11/2018 [phone]   3 :  Cervical canal Tissue [facility] biopsy SURGICAL PATHOLOGY Linzie Collin, MD 07/11/2018 [phone]     COMPLICATIONS: None  DRAINS: None  DISPOSITION: Stable to recovery room  DESCRIPTION OF PROCEDURE:      The patient was prepped and draped in the dorsal lithotomy position and placed under general anesthesia. Her cervix was painted with Lugol's solution and the dysplastic areas identified.  Local injection with Lidocaine with Epi was performed in a circumferential manner.  Using the larger wire loop on 35W blend 1 a LEEP was performed with Posterior, Anterior and Canal specimens sent separately.  The ball electrode was used to coagulate the base of the defect and the edges.  The cervix was then minimally dilated to accommodate the IUD.  The IUD was placed respecting the position and curvature of the uterus.  The strings were cut to the appropriate length.  The tenaculum was removed from the cervix and hemostasis was noted. Monsel's solution was then used to paint the cervix.  The speculum was removed and the patient went to recovery room in stable condition.  No follow-up provider specified.  Elonda Husky, M.D. 07/11/2018 8:42 AM

## 2018-07-12 LAB — SURGICAL PATHOLOGY

## 2018-07-14 ENCOUNTER — Encounter: Payer: Self-pay | Admitting: Obstetrics and Gynecology

## 2018-07-22 ENCOUNTER — Telehealth: Payer: Self-pay | Admitting: Obstetrics and Gynecology

## 2018-07-22 NOTE — Telephone Encounter (Signed)
LM for patient to return call.

## 2018-07-22 NOTE — Telephone Encounter (Signed)
The patient called to schedule a follow apt after surgery but she was unsure of when she should return. I tried to look through the patients ED note and call back to get a nurse but was unsuccessful. Please notify me of a date to schedule patient so I can return pt's call and schedule apt. Please advise.

## 2018-07-26 ENCOUNTER — Telehealth: Payer: Self-pay | Admitting: Obstetrics and Gynecology

## 2018-07-26 NOTE — Telephone Encounter (Signed)
The patient called and stated that she missed a call from her nurse and would like a call back if possible. Please advise.  °

## 2018-07-26 NOTE — Telephone Encounter (Signed)
LM for patient to return call.

## 2018-07-28 ENCOUNTER — Ambulatory Visit (INDEPENDENT_AMBULATORY_CARE_PROVIDER_SITE_OTHER): Payer: Medicaid Other | Admitting: Obstetrics and Gynecology

## 2018-07-28 ENCOUNTER — Encounter: Payer: Self-pay | Admitting: Obstetrics and Gynecology

## 2018-07-28 VITALS — BP 95/57 | HR 67 | Ht 66.0 in | Wt 161.9 lb

## 2018-07-28 DIAGNOSIS — N871 Moderate cervical dysplasia: Secondary | ICD-10-CM

## 2018-07-28 DIAGNOSIS — Z9889 Other specified postprocedural states: Secondary | ICD-10-CM

## 2018-07-28 NOTE — Telephone Encounter (Signed)
Patient came in for appointment.  

## 2018-07-28 NOTE — Progress Notes (Signed)
Patient comes in today for post op appointment. She has been bleeding since the procedure.

## 2018-07-28 NOTE — Progress Notes (Signed)
HPI:      [name] is a 28 y.o. [id number] who LMP was Patient's last menstrual period was 07/25/2018.  Subjective:   She presents today approximately 2 weeks from her surgery.  She reports that she has daily spotting but it is minimal.  She has no cramping or other issues.  She has not resumed intercourse.    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 She does not have any pertinent problems on file. She  has a past surgical history that includes No past surgeries; LEEP (N/A, 07/11/2018); and Intrauterine device (iud) insertion (N/A, 07/11/2018). Her family history is not on file. She  reports that she has never smoked. She has never used smokeless tobacco. She reports that she does not drink alcohol or use drugs. She has a current medication list which includes the following prescription(s): mirtazapine. She has No Known Allergies.       Review of Systems:  Review of Systems  Constitutional: Denied constitutional symptoms, night sweats, recent illness, fatigue, fever, insomnia and weight loss.  Eyes: Denied eye symptoms, eye pain, photophobia, vision change and visual disturbance.  Ears/Nose/Throat/Neck: Denied ear, nose, throat or neck symptoms, hearing loss, nasal discharge, sinus congestion and sore throat.  Cardiovascular: Denied cardiovascular symptoms, arrhythmia, chest pain/pressure, edema, exercise intolerance, orthopnea and palpitations.  Respiratory: Denied pulmonary symptoms, asthma, pleuritic pain, productive sputum, cough, dyspnea and wheezing.  Gastrointestinal: Denied, gastro-esophageal reflux, melena, nausea and vomiting.  Genitourinary: Denied genitourinary  symptoms including symptomatic vaginal discharge, pelvic relaxation issues, and urinary complaints.  Musculoskeletal: Denied musculoskeletal symptoms, stiffness, swelling, muscle weakness and myalgia.  Dermatologic: Denied dermatology symptoms, rash and scar.  Neurologic: Denied neurology symptoms, dizziness, headache, neck pain and syncope.  Psychiatric: Denied psychiatric symptoms, anxiety and depression.  Endocrine: Denied endocrine symptoms including hot flashes and night sweats.   Meds:   Current Outpatient Medications on File Prior to Visit  Medication Sig Dispense Refill  . mirtazapine (REMERON SOL-TAB) 30 MG disintegrating tablet Take 1 tablet (30 mg total) by mouth at bedtime for 30 days. 30 tablet 0   No current facility-administered medications on file prior to visit.     Objective:     Vitals:   07/28/18 0924  BP: (!) 95/57  Pulse: 67                Assessment:    [id number] Patient Active Problem List   Diagnosis Date Noted  . Vaginal discharge 01/26/2018  . Abnormal uterine bleeding (AUB) 01/26/2018  . STD exposure 01/26/2018  . Pap smear of cervix with ASCUS, cannot exclude HGSIL 05/08/2016  . Chlamydia infection affecting pregnancy in first trimester 05/08/2016     1. Post-operative state   2. CIN II (cervical intraepithelial neoplasia II)     She is doing well postop  No problems with IUD   Plan:            1.  Recommend follow-up in 3 to 4 weeks for cervical check and IUD string check.  Patient may resume intercourse in 2 weeks.  2.  Recommend first Pap 6 months Orders No orders of the defined types were placed in this encounter.   No orders  of the defined types were placed in this encounter.     F/U  Return in about 4 weeks (around 08/25/2018) for For IUD f/u.  Elonda Husky, M.D. 07/28/2018 9:45 AM

## 2018-08-25 ENCOUNTER — Encounter: Payer: Self-pay | Admitting: Obstetrics and Gynecology

## 2018-08-25 ENCOUNTER — Ambulatory Visit (INDEPENDENT_AMBULATORY_CARE_PROVIDER_SITE_OTHER): Payer: Medicaid Other | Admitting: Obstetrics and Gynecology

## 2018-08-25 ENCOUNTER — Other Ambulatory Visit: Payer: Self-pay

## 2018-08-25 VITALS — Ht 66.0 in | Wt 163.0 lb

## 2018-08-25 DIAGNOSIS — Z30431 Encounter for routine checking of intrauterine contraceptive device: Secondary | ICD-10-CM

## 2018-08-25 DIAGNOSIS — N871 Moderate cervical dysplasia: Secondary | ICD-10-CM

## 2018-08-25 NOTE — Progress Notes (Signed)
Virtual Visit via Telephone Note  I connected with Tiffany Bowen on 08/25/18 at  2:00 PM EDT by telephone and verified that I am speaking with the correct person using two identifiers.   I discussed the limitations, risks, security and privacy concerns of performing an evaluation and management service by telephone and the availability of in person appointments. I also discussed with the patient that there may be a patient responsible charge related to this service. The patient expressed understanding and agreed to proceed.  Location of patient:  Home  Patient gave explicit verbal consent for telephone visit:  YES  Location of provider:  [facility] office  Persons other than physician and patient involved in provider conference:  None  History of Present Illness:    She reports that she is not having any significant problems with her IUD however she does have bleeding almost every day.  She states that she thinks her period came on 2 days ago and it is heavy at this time.  She says if her bleeding does not get better she would like her IUD removed. She states that she is staying at home as much as possible and taking care to avoid COVID-19.  We have discussed this in some detail.    Observations/Objective:      Assessment: IUD- patient continues to have some form of daily bleeding and she is disappointed at this time.  I believe that over the next few weeks her bleeding should resolve and hopefully she will be happier with her IUD.  Status post LEEP- part of her bleeding may be secondary to LEEP.  Plan:   Expectant management for 2 weeks.  If her bleeding continues she would like to try OCPs for 1 month to give her a break from bleeding.  Should she fail OCPs she would like her IUD removed.   Follow Up Instructions:   Patient to contact us if she continues to experience problems with her IUD and bleeding.  Otherwise follow-up for first Pap smear 6 months after LEEP.    I discussed the  assessment and treatment plan with the patient.  The patient agreed with the plan and demonstrated an understanding of the instructions.  She was given an opportunity to ask questions and all of her questions were answered.   The patient was advised to call back or seek an in-person evaluation if the symptoms worsen or if the condition fails to improve as anticipated.  I provided 16 minutes of non-face-to-face time during this encounter.   Brennan Bailey, MD

## 2018-10-24 ENCOUNTER — Telehealth: Payer: Self-pay | Admitting: Obstetrics and Gynecology

## 2018-10-24 NOTE — Telephone Encounter (Signed)
Pt was called to speak more about her concerns for calling the office this morning. Pt stated that her IUD strings fell out, but she can feel the IUD itself and is wondered that the IUD is not in place. Pt requested an appointment to have her IUD checked.

## 2018-10-24 NOTE — Telephone Encounter (Signed)
The patient called and stated that she needs to speak with a nurse in regards to her IUD string coming out. Please advise. Pt requesting call back.

## 2018-10-25 ENCOUNTER — Ambulatory Visit (INDEPENDENT_AMBULATORY_CARE_PROVIDER_SITE_OTHER): Payer: Medicaid Other | Admitting: Obstetrics and Gynecology

## 2018-10-25 ENCOUNTER — Encounter: Payer: Self-pay | Admitting: Obstetrics and Gynecology

## 2018-10-25 ENCOUNTER — Other Ambulatory Visit: Payer: Self-pay

## 2018-10-25 VITALS — BP 116/72 | HR 89 | Ht 66.0 in | Wt 160.7 lb

## 2018-10-25 DIAGNOSIS — N921 Excessive and frequent menstruation with irregular cycle: Secondary | ICD-10-CM

## 2018-10-25 DIAGNOSIS — Z975 Presence of (intrauterine) contraceptive device: Secondary | ICD-10-CM

## 2018-10-25 DIAGNOSIS — Z30431 Encounter for routine checking of intrauterine contraceptive device: Secondary | ICD-10-CM

## 2018-10-25 NOTE — Progress Notes (Signed)
Patient states that she seen one of her IUD strings in her pad with her cycle.

## 2018-10-25 NOTE — Progress Notes (Signed)
HPI:      Tiffany Bowen is a 28 y.o. [id number] who LMP was No LMP recorded. (Menstrual status: IUD).  Subjective:   She presents today with 2 issues.  First is that she while initially not having menses with her IUD she now has begun to have a fairly heavy menstrual period.  She also states that she noticed a long black string that she thought was from the IUD and had concerns that her IUD was not correct.    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 She does not have any pertinent problems on file. She  has a past surgical history that includes No past surgeries; LEEP (N/A, 07/11/2018); and Intrauterine device (iud) insertion (N/A, 07/11/2018). Her family history is not on file. She  reports that she has never smoked. She has never used smokeless tobacco. She reports that she does not drink alcohol or use drugs. She currently has no medications in their medication list. She has No Known Allergies.       Review of Systems:  Review of Systems  Constitutional: Denied constitutional symptoms, night sweats, recent illness, fatigue, fever, insomnia and weight loss.  Eyes: Denied eye symptoms, eye pain, photophobia, vision change and visual disturbance.  Ears/Nose/Throat/Neck: Denied ear, nose, throat or neck symptoms, hearing loss, nasal discharge, sinus congestion and sore throat.  Cardiovascular: Denied cardiovascular symptoms, arrhythmia, chest pain/pressure, edema, exercise intolerance, orthopnea and palpitations.  Respiratory: Denied pulmonary symptoms, asthma, pleuritic pain, productive sputum, cough, dyspnea and wheezing.  Gastrointestinal: Denied, gastro-esophageal reflux,  melena, nausea and vomiting.  Genitourinary: Denied genitourinary symptoms including symptomatic vaginal discharge, pelvic relaxation issues, and urinary complaints.  Musculoskeletal: Denied musculoskeletal symptoms, stiffness, swelling, muscle weakness and myalgia.  Dermatologic: Denied dermatology symptoms, rash and scar.  Neurologic: Denied neurology symptoms, dizziness, headache, neck pain and syncope.  Psychiatric: Denied psychiatric symptoms, anxiety and depression.  Endocrine: Denied endocrine symptoms including hot flashes and night sweats.   Meds:   No current outpatient medications on file prior to visit.   No current facility-administered medications on file prior to visit.     Objective:     Vitals:   10/25/18 1031  BP: 116/72  Pulse: 89              Physical examination   Pelvic:   Vulva: Normal appearance.  No lesions.  Vagina: No lesions or abnormalities noted.  Support: Normal pelvic support.  Urethra No masses tenderness or scarring.  Meatus Normal size without lesions or prolapse.  Cervix: Normal appearance.  No lesions. IUD strings noted at cervical os but longer than I would expect.  Strings shortened.  Both strings present.  Anus: Normal exam.  No lesions.  Perineum: Normal exam.  No lesions.        Bimanual   Uterus: Normal size.  Non-tender.  Mobile.  AV.  Adnexae: No masses.  Non-tender to palpation.  Cul-de-sac: Negative for abnormality.     Assessment:    [id number] Patient Active Problem List   Diagnosis Date Noted  . Vaginal discharge 01/26/2018  . Abnormal uterine bleeding (AUB) 01/26/2018  . STD exposure 01/26/2018  . Pap smear of cervix with ASCUS, cannot exclude HGSIL  05/08/2016  . Chlamydia infection affecting pregnancy in first trimester 05/08/2016     1. Breakthrough bleeding with IUD   2. Surveillance of previously prescribed intrauterine contraceptive device     What ever string the patient noted it is not part of her IUD.   However, her IUD strings were longer than expected and she is having rectal bleeding with her IUD.  My concern is that the IUD has shifted to the lower uterine segment increasing her cramping and bleeding and lengthening her strings.   Plan:            1.  Ultrasound to check positioning of IUD  2.  If IUD positioned correctly consider 2 months of OCPs to stop the bleeding. Orders No orders of the defined types were placed in this encounter.   No orders of the defined types were placed in this encounter.     F/U  Return for We will contact her with any abnormal test results. I spent 18 minutes involved in the care of this patient of which greater than 50% was spent discussing IUD, bleeding with IUD, strings and IUDs, ultrasound and work-up for breakthrough bleeding and IUD positioning, possible future management of bleeding with an IUD.  All questions answered.  Elonda Husky, M.D. 10/25/2018 10:48 AM

## 2018-11-01 ENCOUNTER — Other Ambulatory Visit: Payer: Medicaid Other

## 2018-11-09 ENCOUNTER — Other Ambulatory Visit: Payer: Medicaid Other

## 2018-12-23 ENCOUNTER — Encounter: Payer: Self-pay | Admitting: Obstetrics and Gynecology

## 2018-12-23 ENCOUNTER — Other Ambulatory Visit: Payer: Self-pay

## 2018-12-23 ENCOUNTER — Ambulatory Visit (INDEPENDENT_AMBULATORY_CARE_PROVIDER_SITE_OTHER): Payer: Medicaid Other | Admitting: Obstetrics and Gynecology

## 2018-12-23 VITALS — BP 105/69 | HR 64 | Ht 66.0 in | Wt 148.0 lb

## 2018-12-23 DIAGNOSIS — Z30432 Encounter for removal of intrauterine contraceptive device: Secondary | ICD-10-CM

## 2018-12-23 DIAGNOSIS — Z30011 Encounter for initial prescription of contraceptive pills: Secondary | ICD-10-CM | POA: Diagnosis not present

## 2018-12-23 MED ORDER — DESOGESTREL-ETHINYL ESTRADIOL 0.15-0.02/0.01 MG (21/5) PO TABS: 1.0000 | ORAL_TABLET | Freq: Every day | ORAL | 2 refills | Status: DC

## 2018-12-23 NOTE — Progress Notes (Signed)
Patient comes in today for IUD removal. She is still having heavy periods and not happy with IUD.

## 2018-12-23 NOTE — Progress Notes (Signed)
HPI:      Tiffany Bowen is a 28 y.o. [phone] [id number] who LMP was No LMP recorded. (Menstrual status: IUD).  Subjective:   She presents today she states she wants her IUD removed.  She did not go to her ultrasound appointment as scheduled.  She has just decided to use birth control pills. She and her boyfriend have been discussing pregnancy and she does not want a child right now.  He is very enthusiastic about her becoming pregnant and they are having a disagreement.  She says that she is just going to take the pills and not tell him.  I try to discuss this with her in some detail and she declined.    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 She does not have any pertinent problems on file. She  has a past surgical history that includes No past surgeries; LEEP (N/A, 07/11/2018); and Intrauterine device (iud) insertion (N/A, 07/11/2018). Her family history is not on file. She  reports that she has never smoked. She has never used smokeless tobacco. She reports that she does not drink alcohol or use drugs. She currently has no medications in their medication list. She has No Known Allergies.       Review of Systems:  Review of Systems  Constitutional: Denied constitutional symptoms, night sweats, recent illness, fatigue, fever, insomnia and weight loss.  Eyes: Denied eye symptoms, eye pain, photophobia, vision change and visual disturbance.  Ears/Nose/Throat/Neck: Denied ear, nose, throat or neck symptoms, hearing loss, nasal discharge, sinus congestion and sore throat.  Cardiovascular: Denied cardiovascular symptoms, arrhythmia, chest pain/pressure, edema, exercise intolerance, orthopnea  and palpitations.  Respiratory: Denied pulmonary symptoms, asthma, pleuritic pain, productive sputum, cough, dyspnea and wheezing.  Gastrointestinal: Denied, gastro-esophageal reflux, melena, nausea and vomiting.  Genitourinary: Denied genitourinary symptoms including symptomatic vaginal discharge, pelvic relaxation issues, and urinary complaints.  Musculoskeletal: Denied musculoskeletal symptoms, stiffness, swelling, muscle weakness and myalgia.  Dermatologic: Denied dermatology symptoms, rash and scar.  Neurologic: Denied neurology symptoms, dizziness, headache, neck pain and syncope.  Psychiatric: Denied psychiatric symptoms, anxiety and depression.  Endocrine: Denied endocrine symptoms including hot flashes and night sweats.   Meds:   No current outpatient medications on file prior to visit.   No current facility-administered medications on file prior to visit.     Objective:     Vitals:   12/23/18 0833  BP: 105/69  Pulse: 64              Physical examination   Pelvic:   Vulva: Normal appearance.  No lesions.  Vagina: No lesions or abnormalities noted.  Support: Normal pelvic support.  Urethra No masses tenderness or scarring.  Meatus Normal size without lesions or prolapse.  Cervix: Normal appearance.  No lesions. IUD strings noted at cervical os.  Anus: Normal exam.  No lesions.  Perineum: Normal exam.  No lesions.        Bimanual   Uterus: Normal size.  Non-tender.  Mobile.  AV.  Adnexae: No masses.  Non-tender to palpation.  Cul-de-sac: Negative for abnormality.   IUD Removal Strings of IUD identified and grasped.  IUD removed without problem.  Pt tolerated this well.  IUD noted  to be intact.     Assessment:    [id number] Patient Active Problem List   Diagnosis Date Noted  . Vaginal discharge 01/26/2018  . Abnormal uterine bleeding (AUB) 01/26/2018  . STD exposure 01/26/2018  . Pap smear of cervix with ASCUS, cannot exclude HGSIL 05/08/2016  . Chlamydia  infection affecting pregnancy in first trimester 05/08/2016     1. Encounter for IUD removal   2. Initiation of OCP (BCP)        Plan:            1.  OCPs The risks /benefits of OCPs have been explained to the patient in detail.  Product literature has been given to her.  I have instructed her in the use of OCPs and have given her literature reinforcing this information.  I have explained to the patient that OCPs are not as effective for birth control during the first month of use, and that another form of contraception should be used during this time.  Both first-day start and Sunday start have been explained.  The risks and benefits of each was discussed.  She has been made aware of  the fact that other medications may affect the efficacy of OCPs.  I have answered all of her questions, and I believe that she has an understanding of the effectiveness and use of OCPs.  Orders No orders of the defined types were placed in this encounter.   No orders of the defined types were placed in this encounter.     F/U  No follow-ups on file. I spent 17 minutes involved in the care of this patient of which greater than 50% was spent discussing IUD removal, other forms of birth control, to take OCPs correctly, issues with her boyfriend and future pregnancy.  All questions answered.  Finis Bud, M.D. 12/23/2018 9:02 AM

## 2019-01-31 ENCOUNTER — Encounter: Payer: 59 | Admitting: Obstetrics and Gynecology

## 2019-06-13 ENCOUNTER — Other Ambulatory Visit: Payer: Self-pay

## 2019-06-13 ENCOUNTER — Encounter: Payer: Self-pay | Admitting: Obstetrics and Gynecology

## 2019-06-13 ENCOUNTER — Ambulatory Visit (INDEPENDENT_AMBULATORY_CARE_PROVIDER_SITE_OTHER): Payer: 59 | Admitting: Obstetrics and Gynecology

## 2019-06-13 VITALS — BP 127/67 | HR 74 | Ht 66.0 in | Wt 159.0 lb

## 2019-06-13 DIAGNOSIS — N912 Amenorrhea, unspecified: Secondary | ICD-10-CM

## 2019-06-13 DIAGNOSIS — O209 Hemorrhage in early pregnancy, unspecified: Secondary | ICD-10-CM | POA: Diagnosis not present

## 2019-06-13 DIAGNOSIS — N871 Moderate cervical dysplasia: Secondary | ICD-10-CM

## 2019-06-13 DIAGNOSIS — O09291 Supervision of pregnancy with other poor reproductive or obstetric history, first trimester: Secondary | ICD-10-CM | POA: Diagnosis not present

## 2019-06-13 LAB — POCT URINE PREGNANCY: Preg Test, Ur: POSITIVE — AB

## 2019-06-13 NOTE — Progress Notes (Signed)
HPI:      [name] is a 29 y.o. [id number] who LMP was Patient's last menstrual period was 04/26/2019.  Subjective:   She presents today for pregnancy confirmation.  She had her IUD removed and decided to start OCPs with the idea that she may soon want to become pregnant.  She now believes she is pregnant approximately 6 weeks estimated gestational age. She has had some light brown spotting over the last 3 days-denies cramping or significant bleeding. She is not taking prenatal vitamins She describes a history of 2 prior second trimester losses without contractions.  Ultrasound diagnosed fetal loss and then her labor was induced to deliver. She was given 17 P with her successful term pregnancy and she is considering use during this pregnancy. This is a different father from her previous children.    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 She does not have any pertinent problems on file. She  has a past surgical history that includes No past surgeries; LEEP (N/A, 07/11/2018); and Intrauterine device (iud) insertion (N/A, 07/11/2018). Her family history is not on file. She  reports that she has never smoked. She has never used smokeless tobacco. She reports that she does not drink alcohol or use drugs. She has a current medication list which includes the following prescription(s): multivitamin-prenatal. She has No Known Allergies.       Review of Systems:  Review of Systems  Constitutional: Denied constitutional symptoms, night sweats, recent illness, fatigue, fever, insomnia and weight loss.  Eyes: Denied eye symptoms, eye pain, photophobia, vision change and visual  disturbance.  Ears/Nose/Throat/Neck: Denied ear, nose, throat or neck symptoms, hearing loss, nasal discharge, sinus congestion and sore throat.  Cardiovascular: Denied cardiovascular symptoms, arrhythmia, chest pain/pressure, edema, exercise intolerance, orthopnea and palpitations.  Respiratory: Denied pulmonary symptoms, asthma, pleuritic pain, productive sputum, cough, dyspnea and wheezing.  Gastrointestinal: Denied, gastro-esophageal reflux, melena, nausea and vomiting.  Genitourinary: Denied genitourinary symptoms including symptomatic vaginal discharge, pelvic relaxation issues, and urinary complaints.  Musculoskeletal: Denied musculoskeletal symptoms, stiffness, swelling, muscle weakness and myalgia.  Dermatologic: Denied dermatology symptoms, rash and scar.  Neurologic: Denied neurology symptoms, dizziness, headache, neck pain and syncope.  Psychiatric: Denied psychiatric symptoms, anxiety and depression.  Endocrine: Denied endocrine symptoms including hot flashes and night sweats.   Meds:   Current Outpatient Medications on File Prior to Visit  Medication Sig Dispense Refill  . Prenatal Vit-Fe Fumarate-FA (MULTIVITAMIN-PRENATAL) 27-0.8 MG TABS tablet Take 1 tablet by mouth daily at 12 noon.     No current facility-administered medications on file prior to visit.    Objective:     Vitals:   06/13/19 1343  BP: 127/67  Pulse: 74              Urinary pregnancy test positive  Assessment:    [id number] Patient Active Problem List   Diagnosis Date Noted  . Vaginal discharge 01/26/2018  . Abnormal uterine bleeding (AUB) 01/26/2018  . STD exposure 01/26/2018  . Pap smear of cervix with ASCUS, cannot exclude HGSIL 05/08/2016  . Chlamydia infection affecting pregnancy in first trimester 05/08/2016     1. Amenorrhea   2. CIN II (cervical intraepithelial neoplasia II)  3. Bleeding in early pregnancy   4. History of pregnancy loss in prior pregnancy, currently pregnant in  first trimester        Plan:            Prenatal Plan 1.  The patient was given prenatal literature. 2.  She was started on prenatal vitamins. 3.  A prenatal lab panel was ordered or drawn. 4.  An ultrasound was ordered to better determine an EDC. 5.  A nurse visit was scheduled. 6.  Genetic testing and testing for other inheritable conditions discussed in detail. She will decide in the future whether to have these labs performed.  I have encouraged this test because of her previous pregnancy losses diagnosed at ultrasound when no heartbeat was detected. 7.  A general overview of pregnancy testing, visit schedule, ultrasound schedule, and prenatal care was discussed. 8.  COVID and its risks associated with pregnancy, prevention by limiting exposure and use of masks, as well as the risks and benefits of vaccination during pregnancy were discussed in detail.  [facility] policy regarding office and hospital visitation and testing was explained. 9.  Benefits of breast-feeding discussed in detail including both maternal and infant benefits. Ready Set Baby website discussed.   10.  Patient to decide in the future regarding use of 17 P during this pregnancy. 11.  Consider 81 mg ASA beginning at 12 to 14 weeks. 12.  History of LEEP for CIN-2-recommend 29-month postpartum colposcopy.   Orders Orders Placed This Encounter  Procedures  . US OB Comp Less 14 Wks  . US OB Transvaginal  . US OB Comp Less 14 Wks  . POCT urine pregnancy    No orders of the defined types were placed in this encounter.     F/U  Return in about 6 weeks (around 07/25/2019). I spent 22 minutes involved in the care of this patient preparing to see the patient by obtaining and reviewing her medical history (including labs, imaging tests and prior procedures), documenting clinical information in the electronic health record (EHR), writing and sending prescriptions, ordering tests or procedures and directly communicating with the  patient discussing pertinent items from her history and physical exam as well as my assessment and plan as noted above.  All of her questions were answered.  Finis Bud, M.D. 06/13/2019 2:15 PM

## 2019-06-14 ENCOUNTER — Ambulatory Visit (INDEPENDENT_AMBULATORY_CARE_PROVIDER_SITE_OTHER): Payer: 59

## 2019-06-14 DIAGNOSIS — O209 Hemorrhage in early pregnancy, unspecified: Secondary | ICD-10-CM

## 2019-06-14 DIAGNOSIS — O09291 Supervision of pregnancy with other poor reproductive or obstetric history, first trimester: Secondary | ICD-10-CM | POA: Diagnosis not present

## 2019-06-14 DIAGNOSIS — N912 Amenorrhea, unspecified: Secondary | ICD-10-CM

## 2019-06-17 ENCOUNTER — Emergency Department: Payer: 59

## 2019-06-17 ENCOUNTER — Emergency Department
Admission: EM | Admit: 2019-06-17 | Discharge: 2019-06-17 | Disposition: A | Discharge status: Home / Self Care | Payer: 59 | Attending: Emergency Medicine | Admitting: Emergency Medicine

## 2019-06-17 ENCOUNTER — Other Ambulatory Visit: Payer: Self-pay

## 2019-06-17 DIAGNOSIS — O209 Hemorrhage in early pregnancy, unspecified: Secondary | ICD-10-CM | POA: Diagnosis present

## 2019-06-17 DIAGNOSIS — O039 Complete or unspecified spontaneous abortion without complication: Secondary | ICD-10-CM | POA: Insufficient documentation

## 2019-06-17 DIAGNOSIS — R102 Pelvic and perineal pain: Secondary | ICD-10-CM | POA: Insufficient documentation

## 2019-06-17 DIAGNOSIS — Z3A01 Less than 8 weeks gestation of pregnancy: Secondary | ICD-10-CM | POA: Insufficient documentation

## 2019-06-17 LAB — HCG, QUANTITATIVE, PREGNANCY: hCG, Beta Chain, Quant, S: 4013 m[IU]/mL — ABNORMAL HIGH (ref <5)

## 2019-06-17 MED ORDER — OXYCODONE-ACETAMINOPHEN 5-325 MG PO TABS: 1.0000 | ORAL_TABLET | ORAL | 0 refills | Status: DC | PRN

## 2019-06-17 MED ORDER — OXYCODONE-ACETAMINOPHEN 5-325 MG PO TABS
1.0000 | ORAL_TABLET | Freq: Once | ORAL | Status: AC
  Administered 2019-06-17: 1 via ORAL
  Filled 2019-06-17: qty 1

## 2019-06-17 MED ORDER — ONDANSETRON 4 MG PO TBDP
ORAL_TABLET | ORAL | Status: AC
  Administered 2019-06-17: 4 mg
  Filled 2019-06-17: qty 1

## 2019-06-17 NOTE — ED Triage Notes (Signed)
Pt to the er for vaginal bleeding during pregnancy. Pt began bleeding 4 days ago was spotting. Tonight pt began bleeding more like a period. Pt has a hx of miscarriage and has been having a difficult time with pregnancy.

## 2019-06-17 NOTE — ED Provider Notes (Signed)
[facility] Emergency Department Provider Note  ____________________________________________   First MD Initiated Contact with Patient 06/17/19 0408     (approximate)  I have reviewed the triage vital signs and the nursing notes.   HISTORY  Chief Complaint Vaginal Bleeding    HPI Tiffany Bowen is a 29 y.o. female G5, P1 approximately [redacted] weeks pregnant presents to the emergency department with a 4-day history of vaginal bleeding with acute worsening tonight patient states that peritonitis consistent with that of a period.  Patient does admit to current 7 out of 10 abdominal pain.  Patient's blood type is O+     Past Medical History:  Diagnosis Date  . Abdominal pain   . Anemia   . Anxiety   . Breastfeeding (infant)   . BV (bacterial vaginosis)   . 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 Post partum depression   . STD (sexually transmitted disease)     Patient Active Problem List   Diagnosis Date Noted  . Vaginal discharge 01/26/2018  . Abnormal uterine bleeding (AUB) 01/26/2018  . STD exposure 01/26/2018  . Pap smear of cervix with ASCUS, cannot exclude HGSIL 05/08/2016  . Chlamydia infection affecting pregnancy in first trimester 05/08/2016    Past Surgical History:  Procedure Laterality Date  . INTRAUTERINE DEVICE (IUD) INSERTION N/A 07/11/2018   Procedure: INTRAUTERINE DEVICE (IUD) INSERTION Lot # [id number] Exp date Apr 2022;  Surgeon: Linzie Collin, MD;  Location: [facility] ORS;  Service: Gynecology;  Laterality: N/A;  . LEEP N/A 07/11/2018   Procedure: LOOP ELECTROSURGICAL EXCISION PROCEDURE (LEEP);  Surgeon: Linzie Collin, MD;  Location: [facility] ORS;  Service: Gynecology;  Laterality: N/A;  . NO PAST SURGERIES      Prior to Admission medications   Medication Sig Start Date End Date Taking? Authorizing Provider  Prenatal Vit-Fe Fumarate-FA (MULTIVITAMIN-PRENATAL) 27-0.8 MG TABS tablet  Take 1 tablet by mouth daily at 12 noon.    [provider]    Allergies Patient has no known allergies.  Family History  Problem Relation Age of Onset  . Cancer Neg Hx   . Diabetes Neg Hx   . Heart disease Neg Hx     Social History Social History   Tobacco Use  . Smoking status: Never Smoker  . Smokeless tobacco: Never Used  Substance Use Topics  . Alcohol use: No  . Drug use: No    Review of Systems Constitutional: No fever/chills Eyes: No visual changes. ENT: No sore throat. Cardiovascular: Denies chest pain. Respiratory: Denies shortness of breath. Gastrointestinal: No abdominal pain.  No nausea, no vomiting.  No diarrhea.  No constipation. Genitourinary: Positive for vaginal bleeding and pelvic pain Musculoskeletal: Negative for neck pain.  Negative for back pain. Integumentary: Negative for rash. Neurological: Negative for headaches, focal weakness or numbness.   ____________________________________________   PHYSICAL EXAM:  VITAL SIGNS: ED Triage Vitals  Enc Vitals Group     BP 06/17/19 0032 (!) 134/98     Pulse Rate 06/17/19 0032 71     Resp 06/17/19 0032 18     Temp 06/17/19 0032 98.4 F (36.9 C)     Temp Source 06/17/19 0032 Oral     SpO2 06/17/19 0032 100 %     Weight 06/17/19 0033 72.1 kg (158 lb 15.9 oz)     Height 06/17/19 0033 1.676 m (5\' 6" )     Head Circumference --  Peak Flow --      Pain Score 06/17/19 0033 7     Pain Loc --      Pain Edu? --      Excl. in [place]? --     Constitutional: Alert and oriented.  Eyes: Conjunctivae are normal.  Mouth/Throat: Patient is wearing a mask. Neck: No stridor.  No meningeal signs.   Cardiovascular: Normal rate, regular rhythm. Good peripheral circulation. Grossly normal heart sounds. Respiratory: Normal respiratory effort.  No retractions. Gastrointestinal: Soft and nontender. No distention.  Genitourinary: Mild to moderate dark blood noted in vagina. Musculoskeletal: No lower  extremity tenderness nor edema. No gross deformities of extremities. Neurologic:  Normal speech and language. No gross focal neurologic deficits are appreciated.  Skin:  Skin is warm, dry and intact. Psychiatric: Mood and affect are normal. Speech and behavior are normal.  ____________________________________________   LABS (all labs ordered are listed, but only abnormal results are displayed)  Labs Reviewed  HCG, QUANTITATIVE, PREGNANCY - Abnormal; Notable for the following components:      Result Value   hCG, Beta Chain, Quant, S 4,013 (*)    All other components within normal limits  CBC  BASIC METABOLIC PANEL  ABO/RH     RADIOLOGY I,  N Darrel Baroni, personally viewed and evaluated these images (plain radiographs) as part of my medical decision making, as well as reviewing the written report by the radiologist.  ED MD interpretation: Previous intrauterine gestation is no longer identified per radiology no gestational sac or fetal pole identified heterogeneous endometrium with ill-defined fluid consistent with failed pregnancy per radiologist and ultrasound interpretation.  Official radiology report(s): US OB LESS THAN 14 WEEKS WITH OB TRANSVAGINAL  Result Date: 06/17/2019 CLINICAL DATA:  Pregnant patient in first-trimester pregnancy with vaginal bleeding. EXAM: OBSTETRIC <14 WK [place] AND TRANSVAGINAL OB US TECHNIQUE: Both transabdominal and transvaginal ultrasound examinations were performed for complete evaluation of the gestation as well as the maternal uterus, adnexal regions, and pelvic cul-de-sac. Transvaginal technique was performed to assess early pregnancy. COMPARISON:  Obstetric ultrasound 06/14/2019 at [facility] FINDINGS: Intrauterine gestational sac: No longer visualized. Yolk sac: Not visualized. Embryo:  No longer visualized. Cardiac Activity: No longer visualized. Subchorionic hemorrhage:  Not applicable. Maternal uterus/adnexae: Previous intrauterine  gestational sac and fetal pole are no longer visualized. The endometrium is heterogeneous with small amount of ill-defined fluid in the endometrial canal. Endometrial thickness approximately 11 mm. Right ovary is normal measuring 3.0 x 2.0 x 2.3 cm and has blood flow. The left ovary is normal measuring 3.2 x 1.6 x 1.7 cm with blood flow. No adnexal mass. No pelvic free fluid. IMPRESSION: The previous intrauterine gestation is no longer seen. There is no gestational sac or fetal pole. Heterogeneous endometrium with ill-defined fluid. Findings consistent with failed pregnancy. Electronically Signed   By: Keith Rake M.D.   On: 06/17/2019 02:25     Procedures   ____________________________________________   INITIAL IMPRESSION / MDM / [place] / ED COURSE  As part of my medical decision making, I reviewed the following data within the electronic MEDICAL RECORD NUMBER   29 year old female presented with above-stated history and physical exam differential diagnosis including miscarriage.  Patient ultrasound consistent with a failed pregnancy per radiologist as fetal pole and gestational sac no longer identified.  Patient given a Percocet in the emergency department will be prescribed the same for home.  ____________________________________________  FINAL CLINICAL IMPRESSION(S) / ED DIAGNOSES  Final diagnoses:  Vaginal bleeding  affecting early pregnancy     MEDICATIONS GIVEN DURING THIS VISIT:  Medications  oxyCODONE-acetaminophen (PERCOCET/ROXICET) 5-325 MG per tablet 1 tablet (1 tablet Oral Given 06/17/19 0417)  ondansetron (ZOFRAN-ODT) 4 MG disintegrating tablet (4 mg  Given 06/17/19 0417)     ED Discharge Orders    None      *Please note:  Tiffany Bowen was evaluated in Emergency Department on 06/17/2019 for the symptoms described in the history of present illness. She was evaluated in the context of the global COVID-19 pandemic, which necessitated consideration that the  patient might be at risk for infection with the SARS-CoV-2 virus that causes COVID-19. Institutional protocols and algorithms that pertain to the evaluation of patients at risk for COVID-19 are in a state of rapid change based on information released by regulatory bodies including the CDC and federal and state organizations. These policies and algorithms were followed during the patient's care in the ED.  Some ED evaluations and interventions may be delayed as a result of limited staffing during the pandemic.*  Note:  This document was prepared using Dragon voice recognition software and may include unintentional dictation errors.   Darci Current, MD 06/17/19 [phone]

## 2019-06-30 ENCOUNTER — Encounter: Payer: Self-pay | Admitting: Obstetrics and Gynecology

## 2019-06-30 ENCOUNTER — Other Ambulatory Visit: Payer: Self-pay

## 2019-06-30 ENCOUNTER — Ambulatory Visit (INDEPENDENT_AMBULATORY_CARE_PROVIDER_SITE_OTHER): Payer: 59 | Admitting: Obstetrics and Gynecology

## 2019-06-30 VITALS — BP 97/66 | HR 74 | Ht 66.0 in | Wt 163.5 lb

## 2019-06-30 DIAGNOSIS — Z3009 Encounter for other general counseling and advice on contraception: Secondary | ICD-10-CM | POA: Diagnosis not present

## 2019-06-30 DIAGNOSIS — Z30011 Encounter for initial prescription of contraceptive pills: Secondary | ICD-10-CM

## 2019-06-30 MED ORDER — DESOGESTREL-ETHINYL ESTRADIOL 0.15-0.02/0.01 MG (21/5) PO TABS: 1.0000 | ORAL_TABLET | Freq: Every day | ORAL | 3 refills | Status: DC

## 2019-06-30 NOTE — Progress Notes (Signed)
HPI:      Tiffany Bowen is a 29 y.o. [id number] who LMP was Patient's last menstrual period was 04/26/2019.  Subjective:   She presents today to discuss her recent miscarriage and plan for birth control.  Patient says she is doing well after miscarriage.  She is no longer bleeding or cramping. She is not considering pregnancy in the near future and would like to start birth control pills. She has taken pills before.    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 She does not have any pertinent problems on file. She  has a past surgical history that includes No past surgeries; LEEP (N/A, 07/11/2018); and Intrauterine device (iud) insertion (N/A, 07/11/2018). Her family history is not on file. She  reports that she has never smoked. She has never used smokeless tobacco. She reports that she does not drink alcohol or use drugs. She has a current medication list which includes the following prescription(s): desogestrel-ethinyl estradiol. She has No Known Allergies.       Review of Systems:  Review of Systems  Constitutional: Denied constitutional symptoms, night sweats, recent illness, fatigue, fever, insomnia and weight loss.  Eyes: Denied eye symptoms, eye pain, photophobia, vision change and visual disturbance.  Ears/Nose/Throat/Neck: Denied ear, nose, throat or neck symptoms, hearing loss, nasal discharge, sinus congestion and sore throat.  Cardiovascular: Denied cardiovascular symptoms, arrhythmia, chest pain/pressure, edema, exercise intolerance, orthopnea and palpitations.  Respiratory: Denied pulmonary symptoms, asthma, pleuritic pain, productive sputum, cough, dyspnea and  wheezing.  Gastrointestinal: Denied, gastro-esophageal reflux, melena, nausea and vomiting.  Genitourinary: Denied genitourinary symptoms including symptomatic vaginal discharge, pelvic relaxation issues, and urinary complaints.  Musculoskeletal: Denied musculoskeletal symptoms, stiffness, swelling, muscle weakness and myalgia.  Dermatologic: Denied dermatology symptoms, rash and scar.  Neurologic: Denied neurology symptoms, dizziness, headache, neck pain and syncope.  Psychiatric: Denied psychiatric symptoms, anxiety and depression.  Endocrine: Denied endocrine symptoms including hot flashes and night sweats.   Meds:   No current outpatient medications on file prior to visit.   No current facility-administered medications on file prior to visit.    Objective:     Vitals:   06/30/19 1040  BP: 97/66  Pulse: 74                Assessment:    [id number] Patient Active Problem List   Diagnosis Date Noted  . Vaginal discharge 01/26/2018  . Abnormal uterine bleeding (AUB) 01/26/2018  . STD exposure 01/26/2018  . Pap smear of cervix with ASCUS, cannot exclude HGSIL 05/08/2016  . Chlamydia infection affecting pregnancy in first trimester 05/08/2016     1. Birth control counseling   2. Initiation of OCP (BCP)        Plan:            1.  OCPs The risks /benefits of OCPs have been explained to the patient in detail.  Product literature has been given to her where appropriate.  I have instructed her in the use of OCPs.  I have explained to the patient that OCPs are not as effective for birth control during the first month of use, and that another form of contraception should be used during this time.  Both first-day start and Sunday start have been explained.  The risks and benefits of each was discussed.  She has been made aware of  the fact that in rare circumstances, other medications may affect the efficacy of OCPs.  I have answered all of her questions, and I believe that she  has an understanding of the effectiveness and use of OCPs. I recommended that she wait for 4 weeks after her miscarriage to begin OCPs.  Patient will need to use protection during the first pack of pills. Orders No orders of the defined types were placed in this encounter.    Meds ordered this encounter  Medications  . desogestrel-ethinyl estradiol (MIRCETTE) 0.15-0.02/0.01 MG (21/5) tablet    Sig: Take 1 tablet by mouth at bedtime.    Dispense:  1 Package    Refill:  3      F/U  No follow-ups on file. I spent 22 minutes involved in the care of this patient preparing to see the patient by obtaining and reviewing her medical history (including labs, imaging tests and prior procedures), documenting clinical information in the electronic health record (EHR), counseling and coordinating care plans, writing and sending prescriptions, ordering tests or procedures and directly communicating with the patient by discussing pertinent items from her history and physical exam as well as detailing my assessment and plan as noted above so that she has an informed understanding.  All of her questions were answered.  Elonda Husky, M.D. 06/30/2019 11:11 AM

## 2019-07-26 ENCOUNTER — Encounter: Payer: Medicaid Other | Admitting: Obstetrics and Gynecology

## 2019-10-27 ENCOUNTER — Encounter: Payer: 59 | Admitting: Obstetrics and Gynecology

## 2019-10-31 ENCOUNTER — Encounter: Payer: 59 | Admitting: Obstetrics and Gynecology

## 2019-11-21 ENCOUNTER — Encounter: Payer: Self-pay | Admitting: Intensive Care

## 2019-11-21 ENCOUNTER — Emergency Department
Admission: EM | Admit: 2019-11-21 | Discharge: 2019-11-21 | Disposition: A | Discharge status: Home / Self Care | Payer: 59 | Attending: Emergency Medicine | Admitting: Emergency Medicine

## 2019-11-21 ENCOUNTER — Other Ambulatory Visit: Payer: Self-pay

## 2019-11-21 DIAGNOSIS — Z3A14 14 weeks gestation of pregnancy: Secondary | ICD-10-CM | POA: Diagnosis not present

## 2019-11-21 DIAGNOSIS — O26891 Other specified pregnancy related conditions, first trimester: Secondary | ICD-10-CM | POA: Diagnosis present

## 2019-11-21 DIAGNOSIS — N898 Other specified noninflammatory disorders of vagina: Secondary | ICD-10-CM

## 2019-11-21 DIAGNOSIS — N3000 Acute cystitis without hematuria: Secondary | ICD-10-CM

## 2019-11-21 DIAGNOSIS — R112 Nausea with vomiting, unspecified: Secondary | ICD-10-CM | POA: Insufficient documentation

## 2019-11-21 DIAGNOSIS — Z3491 Encounter for supervision of normal pregnancy, unspecified, first trimester: Secondary | ICD-10-CM

## 2019-11-21 DIAGNOSIS — O2311 Infections of bladder in pregnancy, first trimester: Secondary | ICD-10-CM | POA: Diagnosis not present

## 2019-11-21 DIAGNOSIS — B9689 Other specified bacterial agents as the cause of diseases classified elsewhere: Secondary | ICD-10-CM

## 2019-11-21 DIAGNOSIS — R102 Pelvic and perineal pain: Secondary | ICD-10-CM

## 2019-11-21 LAB — WET PREP, GENITAL
Sperm: NONE SEEN
Trich, Wet Prep: NONE SEEN
Yeast Wet Prep HPF POC: NONE SEEN

## 2019-11-21 LAB — COMPREHENSIVE METABOLIC PANEL
ALT: 15 U/L (ref 0–44)
AST: 15 U/L (ref 15–41)
Albumin: 4 g/dL (ref 3.5–5.0)
Alkaline Phosphatase: 39 U/L (ref 38–126)
Anion gap: 7 (ref 5–15)
BUN: 9 mg/dL (ref 6–20)
CO2: 24 mmol/L (ref 22–32)
Calcium: 8.8 mg/dL — ABNORMAL LOW (ref 8.9–10.3)
Chloride: 105 mmol/L (ref 98–111)
Creatinine, Ser: 0.77 mg/dL (ref 0.44–1.00)
GFR calc Af Amer: >60 mL/min (ref >60)
GFR calc non Af Amer: >60 mL/min (ref >60)
Glucose, Bld: 89 mg/dL (ref 70–99)
Potassium: 3.7 mmol/L (ref 3.5–5.1)
Sodium: 136 mmol/L (ref 135–145)
Total Bilirubin: 1.4 mg/dL — ABNORMAL HIGH (ref 0.3–1.2)
Total Protein: 7.5 g/dL (ref 6.5–8.1)

## 2019-11-21 LAB — URINALYSIS, COMPLETE (UACMP) WITH MICROSCOPIC
Bilirubin Urine: NEGATIVE
Glucose, UA: NEGATIVE mg/dL
Hgb urine dipstick: NEGATIVE
Ketones, ur: NEGATIVE mg/dL
Nitrite: POSITIVE — AB
Protein, ur: NEGATIVE mg/dL
Specific Gravity, Urine: 1.023 (ref 1.005–1.030)
pH: 6 (ref 5.0–8.0)

## 2019-11-21 LAB — CHLAMYDIA/NGC RT PCR (ARMC ONLY)
Chlamydia Tr: NOT DETECTED
N gonorrhoeae: NOT DETECTED

## 2019-11-21 LAB — CBC
HCT: 34.1 % — ABNORMAL LOW (ref 36.0–46.0)
Hemoglobin: 11.6 g/dL — ABNORMAL LOW (ref 12.0–15.0)
MCH: 28.9 pg (ref 26.0–34.0)
MCHC: 34 g/dL (ref 30.0–36.0)
MCV: 84.8 fL (ref 80.0–100.0)
Platelets: 217 10*3/uL (ref 150–400)
RBC: 4.02 MIL/uL (ref 3.87–5.11)
RDW: 12.3 % (ref 11.5–15.5)
WBC: 6 10*3/uL (ref 4.0–10.5)
nRBC: 0 % (ref 0.0–0.2)

## 2019-11-21 LAB — PREGNANCY, URINE: Preg Test, Ur: POSITIVE — AB

## 2019-11-21 LAB — LIPASE, BLOOD: Lipase: 33 U/L (ref 11–51)

## 2019-11-21 LAB — HCG, QUANTITATIVE, PREGNANCY: hCG, Beta Chain, Quant, S: 17390 m[IU]/mL — ABNORMAL HIGH (ref <5)

## 2019-11-21 MED ORDER — SODIUM CHLORIDE 0.9 % IV SOLN
2.0000 g | Freq: Once | INTRAVENOUS | Status: AC
  Administered 2019-11-21: 2 g via INTRAVENOUS
  Filled 2019-11-21: qty 20

## 2019-11-21 MED ORDER — METRONIDAZOLE 500 MG PO TABS
500.0000 mg | ORAL_TABLET | Freq: Two times a day (BID) | ORAL | 0 refills | Status: AC
Start: 2019-11-21 — End: 2019-11-28

## 2019-11-21 MED ORDER — DOXYLAMINE-PYRIDOXINE 10-10 MG PO TBEC
DELAYED_RELEASE_TABLET | ORAL | 0 refills | Status: DC
Start: 2019-11-21 — End: 2020-01-11

## 2019-11-21 MED ORDER — ONDANSETRON HCL 4 MG/2ML IJ SOLN
4.0000 mg | Freq: Once | INTRAMUSCULAR | Status: AC
  Administered 2019-11-21: 4 mg via INTRAVENOUS
  Filled 2019-11-21: qty 2

## 2019-11-21 MED ORDER — CEPHALEXIN 500 MG PO CAPS: 500.0000 mg | ORAL_CAPSULE | Freq: Three times a day (TID) | ORAL | 0 refills | Status: AC

## 2019-11-21 MED ORDER — LACTATED RINGERS IV BOLUS
1000.0000 mL | Freq: Once | INTRAVENOUS | Status: AC
  Administered 2019-11-21: 1000 mL via INTRAVENOUS

## 2019-11-21 NOTE — ED Triage Notes (Signed)
Patient c/o abdominal pain with N/V since Sunday 11/19/19. Also reports headache

## 2019-11-21 NOTE — ED Provider Notes (Signed)
[facility] Emergency Department Provider Note  ____________________________________________   First MD Initiated Contact with Patient 11/21/19 2006     (approximate)  I have reviewed the triage vital signs and the nursing notes.   HISTORY  Chief Complaint Abdominal Pain    HPI Tiffany Bowen is a 29 y.o. female  With PMHx below here with abdominal pain. Pt reports that her sx started yesterday as moderate abdominal pain, nausea, vomiting. She was out in [place] over the weekend and was around large crowds of people. She reports that she has been fatigued since then, btu her n/v started yesterday and she's now had difficulty keeping food down. She denies any ongoing or persistent abd pain. No known fevers, but has had some chills. No flank pain. She does note some mild suprapubic discomfort, vaginal dsicharge though this has been a recurrent issue, and denies any high risk recent sexual activity or concern for STI.        Past Medical History:  Diagnosis Date  . Abdominal pain   . Anemia   . Anxiety   . Breastfeeding (infant)   . BV (bacterial vaginosis)   . 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 Post partum depression   . STD (sexually transmitted disease)     Patient Active Problem List   Diagnosis Date Noted  . Vaginal discharge 01/26/2018  . Abnormal uterine bleeding (AUB) 01/26/2018  . STD exposure 01/26/2018  . Pap smear of cervix with ASCUS, cannot exclude HGSIL 05/08/2016  . Chlamydia infection affecting pregnancy in first trimester 05/08/2016    Past Surgical History:  Procedure Laterality Date  . INTRAUTERINE DEVICE (IUD) INSERTION N/A 07/11/2018   Procedure: INTRAUTERINE DEVICE (IUD) INSERTION Lot # [id number] Exp date Apr 2022;  Surgeon: Linzie Collin, MD;  Location: [facility] ORS;  Service: Gynecology;  Laterality: N/A;  . LEEP N/A 07/11/2018   Procedure: LOOP ELECTROSURGICAL  EXCISION PROCEDURE (LEEP);  Surgeon: Linzie Collin, MD;  Location: [facility] ORS;  Service: Gynecology;  Laterality: N/A;  . NO PAST SURGERIES      Prior to Admission medications   Medication Sig Start Date End Date Taking? Authorizing Provider  cephALEXin (KEFLEX) 500 MG capsule Take 1 capsule (500 mg total) by mouth 3 (three) times daily for 7 days. 11/21/19 11/28/19  Shaune Pollack, MD  desogestrel-ethinyl estradiol (MIRCETTE) 0.15-0.02/0.01 MG (21/5) tablet Take 1 tablet by mouth at bedtime. 06/30/19   Linzie Collin, MD  Doxylamine-Pyridoxine (DICLEGIS) 10-10 MG TBEC Take two tablets at bedtime on day 1 and 2; if symptoms persist, take 1 tab in AM and 2 tabs at bedtime day 3, then 1 tab every 6 hr PRN 11/21/19   Shaune Pollack, MD  metroNIDAZOLE (FLAGYL) 500 MG tablet Take 1 tablet (500 mg total) by mouth 2 (two) times daily for 7 days. 11/21/19 11/28/19  Shaune Pollack, MD    Allergies Patient has no known allergies.  Family History  Problem Relation Age of Onset  . Cancer Neg Hx   . Diabetes Neg Hx   . Heart disease Neg Hx     Social History Social History   Tobacco Use  . Smoking status: Never Smoker  . Smokeless tobacco: Never Used  Vaping Use  . Vaping Use: Never used  Substance Use Topics  . Alcohol use: No  . Drug use: No    Review of Systems  Review of Systems  Constitutional: Negative for chills  and fever.  HENT: Negative for sore throat.   Respiratory: Negative for shortness of breath.   Cardiovascular: Negative for chest pain.  Gastrointestinal: Positive for abdominal pain, nausea and vomiting.  Genitourinary: Positive for vaginal discharge. Negative for flank pain and vaginal bleeding.  Musculoskeletal: Negative for neck pain.  Skin: Negative for rash and wound.  Allergic/Immunologic: Negative for immunocompromised state.  Neurological: Negative for weakness and numbness.  Hematological: Does not bruise/bleed easily.  All other systems reviewed and are  negative.    ____________________________________________  PHYSICAL EXAM:      VITAL SIGNS: ED Triage Vitals  Enc Vitals Group     BP 11/21/19 1828 (!) 118/52     Pulse Rate 11/21/19 1828 (!) 58     Resp 11/21/19 1828 14     Temp 11/21/19 1828 98.3 F (36.8 C)     Temp Source 11/21/19 1828 Oral     SpO2 11/21/19 1828 100 %     Weight 11/21/19 1829 160 lb (72.6 kg)     Height 11/21/19 1829 5\' 6"  (1.676 m)     Head Circumference --      Peak Flow --      Pain Score 11/21/19 1828 0     Pain Loc --      Pain Edu? --      Excl. in GC? --      Physical Exam Vitals and nursing note reviewed.  Constitutional:      General: She is not in acute distress.    Appearance: She is well-developed.  HENT:     Head: Normocephalic and atraumatic.  Eyes:     Conjunctiva/sclera: Conjunctivae normal.  Cardiovascular:     Rate and Rhythm: Normal rate and regular rhythm.     Heart sounds: Normal heart sounds.  Pulmonary:     Effort: Pulmonary effort is normal. No respiratory distress.     Breath sounds: No wheezing.  Abdominal:     General: There is no distension.     Tenderness: There is no abdominal tenderness. There is no right CVA tenderness, left CVA tenderness, guarding or rebound. Negative signs include Murphy's sign.  Genitourinary:    Comments: No pelvic or adnexal TTP Musculoskeletal:     Cervical back: Neck supple.  Skin:    General: Skin is warm.     Capillary Refill: Capillary refill takes less than 2 seconds.     Findings: No rash.  Neurological:     Mental Status: She is alert and oriented to person, place, and time.     Motor: No abnormal muscle tone.       ____________________________________________   LABS (all labs ordered are listed, but only abnormal results are displayed)  Labs Reviewed  WET PREP, GENITAL - Abnormal; Notable for the following components:      Result Value   Clue Cells Wet Prep HPF POC PRESENT (*)    WBC, Wet Prep HPF POC MANY (*)      All other components within normal limits  COMPREHENSIVE METABOLIC PANEL - Abnormal; Notable for the following components:   Calcium 8.8 (*)    Total Bilirubin 1.4 (*)    All other components within normal limits  CBC - Abnormal; Notable for the following components:   Hemoglobin 11.6 (*)    HCT 34.1 (*)    All other components within normal limits  URINALYSIS, COMPLETE (UACMP) WITH MICROSCOPIC - Abnormal; Notable for the following components:   Color, Urine YELLOW (*)  APPearance CLOUDY (*)    Nitrite POSITIVE (*)    Leukocytes,Ua MODERATE (*)    Bacteria, UA MANY (*)    All other components within normal limits  PREGNANCY, URINE - Abnormal; Notable for the following components:   Preg Test, Ur POSITIVE (*)    All other components within normal limits  CHLAMYDIA/NGC RT PCR ([facility] ONLY)  URINE CULTURE  LIPASE, BLOOD  HCG, QUANTITATIVE, PREGNANCY    ____________________________________________  EKG: None ________________________________________  RADIOLOGY All imaging, including plain films, CT scans, and ultrasounds, independently reviewed by me, and interpretations confirmed via formal radiology reads.  ED MD interpretation:   None  Official radiology report(s): No results found.  ____________________________________________  PROCEDURES   Procedure(s) performed (including Critical Care):  Procedures  ____________________________________________  INITIAL IMPRESSION / MDM / ASSESSMENT AND PLAN / ED COURSE  As part of my medical decision making, I reviewed the following data within the electronic MEDICAL RECORD NUMBER Nursing notes reviewed and incorporated, Old chart reviewed, Notes from prior ED visits, and [place] Controlled Substance Database       *Tiffany Bowen was evaluated in Emergency Department on 11/21/2019 for the symptoms described in the history of present illness. She was evaluated in the context of the global COVID-19 pandemic, which  necessitated consideration that the patient might be at risk for infection with the SARS-CoV-2 virus that causes COVID-19. Institutional protocols and algorithms that pertain to the evaluation of patients at risk for COVID-19 are in a state of rapid change based on information released by regulatory bodies including the CDC and federal and state organizations. These policies and algorithms were followed during the patient's care in the ED.  Some ED evaluations and interventions may be delayed as a result of limited staffing during the pandemic.*     Medical Decision Making:  29 yo F here with nausea, vomiting, general fatigue. Labs show +BV, UTI, and pregnancy. She has no abdominal pain, vaginal bleeding, or symptoms to suggest ectopic. Suspect n/v of pregnancy vs viral GI illness in setting of being out last weekend. Serial abd exams benign, hgb is at baseline. Pt feels markedly improved with IVF. No significant vaginal pain or signs of PID clincially.  Discussed labs, findings with pt. Given absence of pain, she would prefer outpt follow-up for u/s which I think is reasonable. I've added on a quant to follow-up with her oB. Will treat her UTI, BV given symptoms, and refer for f/u. Retur precautions given.  ____________________________________________  FINAL CLINICAL IMPRESSION(S) / ED DIAGNOSES  Final diagnoses:  Acute cystitis without hematuria  First trimester pregnancy  Bacterial vaginosis     MEDICATIONS GIVEN DURING THIS VISIT:  Medications  cefTRIAXone (ROCEPHIN) 2 g in sodium chloride 0.9 % 100 mL IVPB (0 g Intravenous Stopped 11/21/19 2117)  lactated ringers bolus 1,000 mL (1,000 mLs Intravenous New Bag/Given 11/21/19 2120)  ondansetron (ZOFRAN) injection 4 mg (4 mg Intravenous Given 11/21/19 2041)     ED Discharge Orders         Ordered    cephALEXin (KEFLEX) 500 MG capsule  3 times daily     Discontinue  Reprint     11/21/19 2154    metroNIDAZOLE (FLAGYL) 500 MG tablet  2  times daily     Discontinue  Reprint     11/21/19 2154    Doxylamine-Pyridoxine (DICLEGIS) 10-10 MG TBEC     Discontinue  Reprint     11/21/19 2154  Note:  This document was prepared using Dragon voice recognition software and may include unintentional dictation errors.   Shaune PollackIsaacs, Maebel Marasco, MD 11/21/19 2207

## 2019-11-24 ENCOUNTER — Telehealth: Payer: Self-pay | Admitting: Obstetrics and Gynecology

## 2019-11-24 DIAGNOSIS — Z3491 Encounter for supervision of normal pregnancy, unspecified, first trimester: Secondary | ICD-10-CM

## 2019-11-24 LAB — URINE CULTURE: Culture: >=100000 — AB

## 2019-11-24 NOTE — Telephone Encounter (Signed)
Pt called in and stated that she needed to make an appt for prenatal care. The pt said she was confirmed at [facility]. I asked the lmp and she stated 10/15/2019 making her 5 weeks and 5 days. I told her the next appt is her nurse intake and that's done around 9 weeks. I told the pt I can do 12/18/2019 for that appt and she said she wanted to be seen sooner because she is high risk, I told her I will send a message to the nurse. I also told her that the nurse will be out of the office till Tuesday the 6th and that she will get a call some time next week. The pt verbally understood. Please advise

## 2019-11-28 NOTE — Telephone Encounter (Signed)
Pt called in and stated that she wasn't called. I told her that his nurse was not here on Friday and that we will be back 7/6. The pt called in today and wanted to talk to a nurse. I called back to JW and she informed me that she has reached out to Dr. Logan Bores and to tell the pt it will be sometime this afternoon. The pt verbally understood. Please advise

## 2019-11-28 NOTE — Telephone Encounter (Signed)
Spoke with patient and she is wanting to get  A work note for light duty. 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 I told patient that I would talk with Dr. Logan Bores and see if he will approve a note.   Wyatt Mage can you please schedule patient for dating [place] and nurse intake. She can do [place] before nurse intake if she would like.

## 2019-11-28 NOTE — Telephone Encounter (Signed)
Called patient and got her schedule for July 29th for that u/s and nurse intake.

## 2019-11-29 ENCOUNTER — Telehealth: Payer: Self-pay | Admitting: Obstetrics and Gynecology

## 2019-11-29 NOTE — Telephone Encounter (Signed)
Patient called in saying she was having some mild cramping in early pregnancy. Could you please advise?

## 2019-11-30 NOTE — Telephone Encounter (Signed)
Spoke with patient and she said that her cramping was mild yesterday. She did not have any bleeding. I offered her an appointment. Patient declined. She will call back if she has any problems.

## 2019-12-01 ENCOUNTER — Encounter: Payer: Self-pay | Admitting: Surgical

## 2019-12-01 NOTE — Telephone Encounter (Signed)
Letter sent through patients my chart. LM for patient that note was sent.

## 2019-12-06 ENCOUNTER — Telehealth: Payer: Self-pay | Admitting: Obstetrics and Gynecology

## 2019-12-06 NOTE — Telephone Encounter (Signed)
Pt called in and stated that she needs a work note stating that she can be on light duty. The pt said that her work has taken her out of work. Please advise

## 2019-12-06 NOTE — Telephone Encounter (Signed)
Please advise on work note.

## 2019-12-14 ENCOUNTER — Other Ambulatory Visit: Payer: Self-pay

## 2019-12-14 ENCOUNTER — Encounter: Payer: Self-pay | Admitting: Emergency Medicine

## 2019-12-14 ENCOUNTER — Emergency Department
Admission: EM | Admit: 2019-12-14 | Discharge: 2019-12-14 | Disposition: A | Discharge status: Home / Self Care | Payer: 59 | Attending: Emergency Medicine | Admitting: Emergency Medicine

## 2019-12-14 DIAGNOSIS — O99281 Endocrine, nutritional and metabolic diseases complicating pregnancy, first trimester: Secondary | ICD-10-CM | POA: Diagnosis not present

## 2019-12-14 DIAGNOSIS — E860 Dehydration: Secondary | ICD-10-CM | POA: Insufficient documentation

## 2019-12-14 DIAGNOSIS — O219 Vomiting of pregnancy, unspecified: Secondary | ICD-10-CM | POA: Insufficient documentation

## 2019-12-14 DIAGNOSIS — Z3A08 8 weeks gestation of pregnancy: Secondary | ICD-10-CM | POA: Insufficient documentation

## 2019-12-14 LAB — COMPREHENSIVE METABOLIC PANEL
ALT: 19 U/L (ref 0–44)
AST: 17 U/L (ref 15–41)
Albumin: 4 g/dL (ref 3.5–5.0)
Alkaline Phosphatase: 35 U/L — ABNORMAL LOW (ref 38–126)
Anion gap: 9 (ref 5–15)
BUN: 8 mg/dL (ref 6–20)
CO2: 25 mmol/L (ref 22–32)
Calcium: 9.3 mg/dL (ref 8.9–10.3)
Chloride: 101 mmol/L (ref 98–111)
Creatinine, Ser: 0.77 mg/dL (ref 0.44–1.00)
GFR calc Af Amer: >60 mL/min (ref >60)
GFR calc non Af Amer: >60 mL/min (ref >60)
Glucose, Bld: 91 mg/dL (ref 70–99)
Potassium: 3.3 mmol/L — ABNORMAL LOW (ref 3.5–5.1)
Sodium: 135 mmol/L (ref 135–145)
Total Bilirubin: 0.9 mg/dL (ref 0.3–1.2)
Total Protein: 7.6 g/dL (ref 6.5–8.1)

## 2019-12-14 LAB — URINALYSIS, COMPLETE (UACMP) WITH MICROSCOPIC
Bilirubin Urine: NEGATIVE
Glucose, UA: NEGATIVE mg/dL
Ketones, ur: 80 mg/dL — AB
Nitrite: NEGATIVE
Protein, ur: 100 mg/dL — AB
Specific Gravity, Urine: 1.031 — ABNORMAL HIGH (ref 1.005–1.030)
Squamous Epithelial / HPF: >50 — ABNORMAL HIGH (ref 0–5)
WBC, UA: >50 WBC/hpf — ABNORMAL HIGH (ref 0–5)
pH: 5 (ref 5.0–8.0)

## 2019-12-14 LAB — CBC
HCT: 34.4 % — ABNORMAL LOW (ref 36.0–46.0)
Hemoglobin: 11.5 g/dL — ABNORMAL LOW (ref 12.0–15.0)
MCH: 28.9 pg (ref 26.0–34.0)
MCHC: 33.4 g/dL (ref 30.0–36.0)
MCV: 86.4 fL (ref 80.0–100.0)
Platelets: 185 10*3/uL (ref 150–400)
RBC: 3.98 MIL/uL (ref 3.87–5.11)
RDW: 11.9 % (ref 11.5–15.5)
WBC: 5.6 10*3/uL (ref 4.0–10.5)
nRBC: 0 % (ref 0.0–0.2)

## 2019-12-14 LAB — LIPASE, BLOOD: Lipase: 41 U/L (ref 11–51)

## 2019-12-14 MED ORDER — DIPHENHYDRAMINE HCL 50 MG/ML IJ SOLN
25.0000 mg | Freq: Once | INTRAMUSCULAR | Status: AC
  Administered 2019-12-14: 25 mg via INTRAVENOUS
  Filled 2019-12-14: qty 1

## 2019-12-14 MED ORDER — DEXTROSE-NACL 5-0.45 % IV SOLN: Freq: Once | INTRAVENOUS | Status: AC

## 2019-12-14 MED ORDER — ONDANSETRON HCL 4 MG/2ML IJ SOLN
4.0000 mg | Freq: Once | INTRAMUSCULAR | Status: AC
  Administered 2019-12-14: 4 mg via INTRAVENOUS
  Filled 2019-12-14: qty 2

## 2019-12-14 MED ORDER — POTASSIUM CHLORIDE CRYS ER 20 MEQ PO TBCR
20.0000 meq | EXTENDED_RELEASE_TABLET | Freq: Once | ORAL | Status: AC
  Administered 2019-12-14: 20 meq via ORAL
  Filled 2019-12-14: qty 1

## 2019-12-14 MED ORDER — SODIUM CHLORIDE 0.9% FLUSH: 3.0000 mL | Freq: Once | INTRAVENOUS | Status: DC

## 2019-12-14 MED ORDER — ONDANSETRON 4 MG PO TBDP
4.0000 mg | ORAL_TABLET | Freq: Four times a day (QID) | ORAL | 0 refills | Status: DC | PRN
Start: 2019-12-14 — End: 2020-01-11

## 2019-12-14 NOTE — ED Triage Notes (Signed)
C/O vomiting since pregnancy began.  EDC:  07/22/2020.  G6 P1

## 2019-12-14 NOTE — ED Notes (Signed)
Pt signed physical discharge form. 

## 2019-12-14 NOTE — ED Provider Notes (Addendum)
[facility] Emergency Department Provider Note  ____________________________________________  Time seen: Approximately 2:30 PM  I have reviewed the triage vital signs and the nursing notes.   HISTORY  Chief Complaint Emesis During Pregnancy    HPI Tiffany Bowen is a 29 y.o. female with no significant past medical history, currently [redacted] weeks pregnant, who was sent to the ED for evaluation of dehydration in the setting of nausea vomiting in pregnancy.  She had initially been taking Diclegis, but had not noticed any improvement in her symptoms.  She has had poor oral intake with persistent nausea vomiting for the past several weeks.  Her doctor prescribed Zofran for her, which she has not yet been able to pick up and referred her to ED for further evaluation.  She denies any chest pain or shortness of breath.  She does have a sore throat, nonproductive cough, and runny nose.  Denies muscle aches or body aches, no known sick contacts.  She is not vaccinated against Covid yet.  No dysuria frequency urgency.  No abnormal vaginal bleeding or discharge.  No pelvic pain or cramping.  Past Medical History:  Diagnosis Date  . Abdominal pain   . Anemia   . Anxiety   . Breastfeeding (infant)   . BV (bacterial vaginosis)   . 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 Post partum depression   . STD (sexually transmitted disease)      Patient Active Problem List   Diagnosis Date Noted  . Vaginal discharge 01/26/2018  . Abnormal uterine bleeding (AUB) 01/26/2018  . STD exposure 01/26/2018  . Pap smear of cervix with ASCUS, cannot exclude HGSIL 05/08/2016  . Chlamydia infection affecting pregnancy in first trimester 05/08/2016     Past Surgical History:  Procedure Laterality Date  . INTRAUTERINE DEVICE (IUD) INSERTION N/A 07/11/2018   Procedure: INTRAUTERINE DEVICE (IUD) INSERTION Lot # [id number] Exp date Apr 2022;   Surgeon: Linzie Collin, MD;  Location: [facility] ORS;  Service: Gynecology;  Laterality: N/A;  . LEEP N/A 07/11/2018   Procedure: LOOP ELECTROSURGICAL EXCISION PROCEDURE (LEEP);  Surgeon: Linzie Collin, MD;  Location: [facility] ORS;  Service: Gynecology;  Laterality: N/A;  . NO PAST SURGERIES       Prior to Admission medications   Medication Sig Start Date End Date Taking? Authorizing Provider  desogestrel-ethinyl estradiol (MIRCETTE) 0.15-0.02/0.01 MG (21/5) tablet Take 1 tablet by mouth at bedtime. 06/30/19   Linzie Collin, MD  Doxylamine-Pyridoxine (DICLEGIS) 10-10 MG TBEC Take two tablets at bedtime on day 1 and 2; if symptoms persist, take 1 tab in AM and 2 tabs at bedtime day 3, then 1 tab every 6 hr PRN 11/21/19   Shaune Pollack, MD  ondansetron (ZOFRAN ODT) 4 MG disintegrating tablet Take 1 tablet (4 mg total) by mouth every 6 (six) hours as needed for nausea. 12/14/19   Linzie Collin, MD     Allergies Patient has no known allergies.   Family History  Problem Relation Age of Onset  . Cancer Neg Hx   . Diabetes Neg Hx   . Heart disease Neg Hx     Social History Social History   Tobacco Use  . Smoking status: Never Smoker  . Smokeless tobacco: Never Used  Vaping Use  . Vaping Use: Never used  Substance Use Topics  . Alcohol use: No  . Drug use: No    Review of Systems  Constitutional:  No fever or chills.  ENT:   Positive sore throat.  Positive rhinorrhea. Cardiovascular:   No chest pain or syncope. Respiratory:   No dyspnea, positive nonproductive cough. Gastrointestinal:   Negative for abdominal pain, positive vomiting Musculoskeletal:   Negative for focal pain or swelling All other systems reviewed and are negative except as documented above in ROS and HPI.  ____________________________________________   PHYSICAL EXAM:  VITAL SIGNS: ED Triage Vitals  Enc Vitals Group     BP 12/14/19 1036 110/71     Pulse Rate 12/14/19 1036 73     Resp  12/14/19 1036 16     Temp 12/14/19 1036 99.1 F (37.3 C)     Temp Source 12/14/19 1036 Oral     SpO2 12/14/19 1036 99 %     Weight 12/14/19 1013 160 lb 0.9 oz (72.6 kg)     Height 12/14/19 1013 5\' 6"  (1.676 m)     Head Circumference --      Peak Flow --      Pain Score 12/14/19 1012 0     Pain Loc --      Pain Edu? --      Excl. in GC? --     Vital signs reviewed, nursing assessments reviewed.   Constitutional:   Alert and oriented. Non-toxic appearance. Eyes:   Conjunctivae are normal. EOMI. PERRL. ENT      Head:   Normocephalic and atraumatic.      Nose: Positive rhinorrhea.      Mouth/Throat: Moist mucous membranes.      Neck:   No meningismus. Full ROM. Hematological/Lymphatic/Immunilogical:   No cervical lymphadenopathy. Cardiovascular:   RRR. Symmetric bilateral radial and DP pulses.  No murmurs. Cap refill less than 2 seconds. Respiratory:   Normal respiratory effort without tachypnea/retractions. Breath sounds are clear and equal bilaterally. No wheezes/rales/rhonchi. Gastrointestinal:   Soft and nontender. Non distended. There is no CVA tenderness.  No rebound, rigidity, or guarding.  Musculoskeletal:   Normal range of motion in all extremities. No joint effusions.  No lower extremity tenderness.  No edema. Neurologic:   Normal speech and language.  Motor grossly intact. No acute focal neurologic deficits are appreciated.  Skin:    Skin is warm, dry and intact. No rash noted.  No petechiae, purpura, or bullae.  ____________________________________________    LABS (pertinent positives/negatives) (all labs ordered are listed, but only abnormal results are displayed) Labs Reviewed  COMPREHENSIVE METABOLIC PANEL - Abnormal; Notable for the following components:      Result Value   Potassium 3.3 (*)    Alkaline Phosphatase 35 (*)    All other components within normal limits  CBC - Abnormal; Notable for the following components:   Hemoglobin 11.5 (*)    HCT 34.4  (*)    All other components within normal limits  URINALYSIS, COMPLETE (UACMP) WITH MICROSCOPIC - Abnormal; Notable for the following components:   Color, Urine AMBER (*)    APPearance CLOUDY (*)    Specific Gravity, Urine 1.031 (*)    Hgb urine dipstick SMALL (*)    Ketones, ur 80 (*)    Protein, ur 100 (*)    Leukocytes,Ua MODERATE (*)    WBC, UA >50 (*)    Bacteria, UA FEW (*)    Squamous Epithelial / LPF >50 (*)    All other components within normal limits  LIPASE, BLOOD   ____________________________________________   EKG    ____________________________________________    RADIOLOGY  No results  found.  ____________________________________________   PROCEDURES Procedures  ____________________________________________    CLINICAL IMPRESSION / ASSESSMENT AND PLAN / ED COURSE  Medications ordered in the ED: Medications  sodium chloride flush (NS) 0.9 % injection 3 mL (has no administration in time range)  dextrose 5 %-0.45 % sodium chloride infusion (has no administration in time range)  ondansetron (ZOFRAN) injection 4 mg (has no administration in time range)  diphenhydrAMINE (BENADRYL) injection 25 mg (has no administration in time range)    Pertinent labs & imaging results that were available during my care of the patient were reviewed by me and considered in my medical decision making (see chart for details).  MADLYN CROSBY was evaluated in Emergency Department on 12/14/2019 for the symptoms described in the history of present illness. She was evaluated in the context of the global COVID-19 pandemic, which necessitated consideration that the patient might be at risk for infection with the SARS-CoV-2 virus that causes COVID-19. Institutional protocols and algorithms that pertain to the evaluation of patients at risk for COVID-19 are in a state of rapid change based on information released by regulatory bodies including the CDC and federal and state  organizations. These policies and algorithms were followed during the patient's care in the ED.   Patient presents with persistent nausea and vomiting during pregnancy.  She is nontoxic with normal vital signs, has symptoms of a viral URI.  Offered Covid testing which she declines.  Discussed Covid vaccination with her, and she agrees to talk about this with her doctor the next time she goes in in 1 week.  Serum labs unremarkable, urinalysis shows signs of dehydration with high specific gravity and ketones.  We will plan to give IV D5 half-normal saline for hydration, Zofran and Benadryl for symptom relief and p.o. trial.  Stable for discharge if she is tolerating p.o. at that point.  UA shows wbc and LE, but is contaminated with few bacteria. Pt has no symptoms of UTI, and without frank bacteruria, would not treat right now. Will send u. Cx. And would treat for UTI with abx if positive.     ____________________________________________   FINAL CLINICAL IMPRESSION(S) / ED DIAGNOSES    Final diagnoses:  Nausea and vomiting during pregnancy prior to [redacted] weeks gestation  Dehydration     ED Discharge Orders    None      Portions of this note were generated with dragon dictation software. Dictation errors may occur despite best attempts at proofreading.   Sharman Cheek, MD 12/14/19 1433    Sharman Cheek, MD 12/14/19 [phone]

## 2019-12-14 NOTE — ED Provider Notes (Signed)
-----------------------------------------   3:06 PM on 12/14/2019 -----------------------------------------  Blood pressure 110/71, pulse 73, temperature 99.1 F (37.3 C), temperature source Oral, resp. rate 16, height 5\' 6"  (1.676 m), weight 72.6 kg, last menstrual period 04/26/2019, SpO2 99 %.  Assuming care from Dr. 14/06/2018.  In short, DAI APEL is a 29 y.o. female with a chief complaint of Emesis During Pregnancy .  Refer to the original H&P for additional details.  The current plan of care is to reassess following treatment for N/V in pregnancy, workup reassuring thus far, if she can tolerate PO will be appropriate for discharge home.  ----------------------------------------- 4:13 PM on 12/14/2019 -----------------------------------------  Patient reports feeling much better following Benadryl and IV fluids, she has been able to tolerate ginger ale as well as potassium supplementation here in the ED.  Urine was sent for culture but we will hold off on treatment for now per initial provider.  Patient was prescribed Zofran earlier today by her OB/GYN's office and at this point she is appropriate for discharge home.  She was counseled to follow-up with OB/GYN and otherwise return to the ED for new worsening symptoms, patient agrees with plan.    12/16/2019, MD 12/14/19 [phone]

## 2019-12-15 LAB — URINE CULTURE

## 2019-12-21 ENCOUNTER — Ambulatory Visit (INDEPENDENT_AMBULATORY_CARE_PROVIDER_SITE_OTHER): Payer: 59

## 2019-12-21 VITALS — BP 109/73 | HR 74 | Ht 66.0 in | Wt 147.5 lb

## 2019-12-21 DIAGNOSIS — Z113 Encounter for screening for infections with a predominantly sexual mode of transmission: Secondary | ICD-10-CM | POA: Diagnosis not present

## 2019-12-21 DIAGNOSIS — Z0283 Encounter for blood-alcohol and blood-drug test: Secondary | ICD-10-CM | POA: Diagnosis not present

## 2019-12-21 DIAGNOSIS — Z3481 Encounter for supervision of other normal pregnancy, first trimester: Secondary | ICD-10-CM | POA: Diagnosis not present

## 2019-12-21 DIAGNOSIS — Z3491 Encounter for supervision of normal pregnancy, unspecified, first trimester: Secondary | ICD-10-CM

## 2019-12-21 DIAGNOSIS — Z3A09 9 weeks gestation of pregnancy: Secondary | ICD-10-CM

## 2019-12-21 NOTE — Patient Instructions (Signed)
WHAT OB PATIENTS CAN EXPECT   Confirmation of pregnancy and ultrasound ordered if medically indicated-[redacted] weeks gestation  New OB (NOB) intake with nurse and New OB (NOB) labs- [redacted] weeks gestation  New OB (NOB) physical examination with provider- 11/[redacted] weeks gestation  Flu vaccine-[redacted] weeks gestation  Anatomy scan-[redacted] weeks gestation  Glucose tolerance test, blood work to test for anemia, T-dap vaccine-[redacted] weeks gestation  Vaginal swabs/cultures-STD/Group B strep-[redacted] weeks gestation  Appointments every 4 weeks until 28 weeks  Every 2 weeks from 28 weeks until 36 weeks  Weekly visits from 36 weeks until delivery  Prenatal Care Prenatal care is health care during pregnancy. It helps you and your unborn baby (fetus) stay as healthy as possible. Prenatal care may be provided by a midwife, a family practice health care provider, or a childbirth and pregnancy specialist (obstetrician). How does this affect me? During pregnancy, you will be closely monitored for any new conditions that might develop. To lower your risk of pregnancy complications, you and your health care provider will talk about any underlying conditions you have. How does this affect my baby? Early and consistent prenatal care increases the chance that your baby will be healthy during pregnancy. Prenatal care lowers the risk that your baby will be:  Born early (prematurely).  Smaller than expected at birth (small for gestational age). What can I expect at the first prenatal care visit? Your first prenatal care visit will likely be the longest. You should schedule your first prenatal care visit as soon as you know that you are pregnant. Your first visit is a good time to talk about any questions or concerns you have about pregnancy. At your visit, you and your health care provider will talk about:  Your medical history, including: ? Any past pregnancies. ? Your family's medical history. ? The baby's father's medical  history. ? Any long-term (chronic) health conditions you have and how you manage them. ? Any surgeries or procedures you have had. ? Any current over-the-counter or prescription medicines, herbs, or supplements you are taking.  Other factors that could pose a risk to your baby, including:  Your home setting and your stress levels, including: ? Exposure to abuse or violence. ? Household financial strain. ? Mental health conditions you have.  Your daily health habits, including diet and exercise. Your health care provider will also:  Measure your weight, height, and blood pressure.  Do a physical exam, including a pelvic and breast exam.  Perform blood tests and urine tests to check for: ? Urinary tract infection. ? Sexually transmitted infections (STIs). ? Low iron levels in your blood (anemia). ? Blood type and certain proteins on red blood cells (Rh antibodies). ? Infections and immunity to viruses, such as hepatitis B and rubella. ? HIV (human immunodeficiency virus).  Do an ultrasound to confirm your baby's growth and development and to help predict your estimated due date (EDD). This ultrasound is done with a probe that is inserted into the vagina (transvaginal ultrasound).  Discuss your options for genetic screening.  Give you information about how to keep yourself and your baby healthy, including: ? Nutrition and taking vitamins. ? Physical activity. ? How to manage pregnancy symptoms such as nausea and vomiting (morning sickness). ? Infections and substances that may be harmful to your baby and how to avoid them. ? Food safety. ? Dental care. ? Working. ? Travel. ? Warning signs to watch for and when to call your health care provider. How often will  I have prenatal care visits? After your first prenatal care visit, you will have regular visits throughout your pregnancy. The visit schedule is often as follows:  Up to week 28 of pregnancy: once every 4 weeks.  28-36  weeks: once every 2 weeks.  After 36 weeks: every week until delivery. Some women may have visits more or less often depending on any underlying health conditions and the health of the baby. Keep all follow-up and prenatal care visits as told by your health care provider. This is important. What happens during routine prenatal care visits? Your health care provider will:  Measure your weight and blood pressure.  Check for fetal heart sounds.  Measure the height of your uterus in your abdomen (fundal height). This may be measured starting around week 20 of pregnancy.  Check the position of your baby inside your uterus.  Ask questions about your diet, sleeping patterns, and whether you can feel the baby move.  Review warning signs to watch for and signs of labor.  Ask about any pregnancy symptoms you are having and how you are dealing with them. Symptoms may include: ? Headaches. ? Nausea and vomiting. ? Vaginal discharge. ? Swelling. ? Fatigue. ? Constipation. ? Any discomfort, including back or pelvic pain. Make a list of questions to ask your health care provider at your routine visits. What tests might I have during prenatal care visits? You may have blood, urine, and imaging tests throughout your pregnancy, such as:  Urine tests to check for glucose, protein, or signs of infection.  Glucose tests to check for a form of diabetes that can develop during pregnancy (gestational diabetes mellitus). This is usually done around week 24 of pregnancy.  An ultrasound to check your baby's growth and development and to check for birth defects. This is usually done around week 20 of pregnancy.  A test to check for group B strep (GBS) infection. This is usually done around week 36 of pregnancy.  Genetic testing. This may include blood or imaging tests, such as an ultrasound. Some genetic tests are done during the first trimester and some are done during the second trimester. What else  can I expect during prenatal care visits? Your health care provider may recommend getting certain vaccines during pregnancy. These may include:  A yearly flu shot (annual influenza vaccine). This is especially important if you will be pregnant during flu season.  Tdap (tetanus, diphtheria, pertussis) vaccine. Getting this vaccine during pregnancy can protect your baby from whooping cough (pertussis) after birth. This vaccine may be recommended between weeks 27 and 36 of pregnancy. Later in your pregnancy, your health care provider may give you information about:  Childbirth and breastfeeding classes.  Choosing a health care provider for your baby.  Umbilical cord banking.  Breastfeeding.  Birth control after your baby is born.  The hospital labor and delivery unit and how to tour it.  Registering at the hospital before you go into labor. Where to find more information  Office on Women's Health: womenshealth.gov  American Pregnancy Association: americanpregnancy.org  March of Dimes: marchofdimes.org Summary  Prenatal care helps you and your baby stay as healthy as possible during pregnancy.  Your first prenatal care visit will most likely be the longest.  You will have visits and tests throughout your pregnancy to monitor your health and your baby's health.  Bring a list of questions to your visits to ask your health care provider.  Make sure to keep all follow-up and prenatal   care visits with your health care provider. This information is not intended to replace advice given to you by your health care provider. Make sure you discuss any questions you have with your health care provider. Document Revised: 08/31/2018 Document Reviewed: 05/10/2017 Elsevier Patient Education  2020 [employer] Morning Sickness  Morning sickness is when you feel sick to your stomach (nauseous) during pregnancy. You may feel sick to your stomach and throw up (vomit). You may feel sick in the  morning, but you can feel this way at any time of day. Some women feel very sick to their stomach and cannot stop throwing up (hyperemesis gravidarum). Follow these instructions at home: Medicines  Take over-the-counter and prescription medicines only as told by your doctor. Do not take any medicines until you talk with your doctor about them first.  Taking multivitamins before getting pregnant can stop or lessen the harshness of morning sickness. Eating and drinking  Eat dry toast or crackers before getting out of bed.  Eat 5 or 6 small meals a day.  Eat dry and bland foods like rice and baked potatoes.  Do not eat greasy, fatty, or spicy foods.  Have someone cook for you if the smell of food causes you to feel sick or throw up.  If you feel sick to your stomach after taking prenatal vitamins, take them at night or with a snack.  Eat protein when you need a snack. Nuts, yogurt, and cheese are good choices.  Drink fluids throughout the day.  Try ginger ale made with real ginger, ginger tea made from fresh grated ginger, or ginger candies. General instructions  Do not use any products that have nicotine or tobacco in them, such as cigarettes and e-cigarettes. If you need help quitting, ask your doctor.  Use an air purifier to keep the air in your house free of smells.  Get lots of fresh air.  Try to avoid smells that make you feel sick.  Try: ? Wearing a bracelet that is used for seasickness (acupressure wristband). ? Going to a doctor who puts thin needles into certain body points (acupuncture) to improve how you feel. Contact a doctor if:  You need medicine to feel better.  You feel dizzy or light-headed.  You are losing weight. Get help right away if:  You feel very sick to your stomach and cannot stop throwing up.  You pass out (faint).  You have very bad pain in your belly. Summary  Morning sickness is when you feel sick to your stomach (nauseous) during  pregnancy.  You may feel sick in the morning, but you can feel this way at any time of day.  Making some changes to what you eat may help your symptoms go away. This information is not intended to replace advice given to you by your health care provider. Make sure you discuss any questions you have with your health care provider. Document Revised: 04/23/2017 Document Reviewed: 06/11/2016 Elsevier Patient Education  2020 [employer]. How a Baby Grows During Pregnancy  Pregnancy begins when a female's sperm enters a female's egg (fertilization). Fertilization usually happens in one of the tubes (fallopian tubes) that connect the ovaries to the womb (uterus). The fertilized egg moves down the fallopian tube to the uterus. Once it reaches the uterus, it implants into the lining of the uterus and begins to grow. For the first 10 weeks, the fertilized egg is called an embryo. After 10 weeks, it is called a fetus. As the   fetus continues to grow, it receives oxygen and nutrients through tissue (placenta) that grows to support the developing baby. The placenta is the life support system for the baby. It provides oxygen and nutrition and removes waste. Learning as much as you can about your pregnancy and how your baby is developing can help you enjoy the experience. It can also make you aware of when there might be a problem and when to ask questions. How long does a typical pregnancy last? A pregnancy usually lasts 280 days, or about 40 weeks. Pregnancy is divided into three periods of growth, also called trimesters:  First trimester: 0-12 weeks.  Second trimester: 13-27 weeks.  Third trimester: 28-40 weeks. The day when your baby is ready to be born (full term) is your estimated date of delivery. How does my baby develop month by month? First month  The fertilized egg attaches to the inside of the uterus.  Some cells will form the placenta. Others will form the fetus.  The arms, legs, brain,  spinal cord, lungs, and heart begin to develop.  At the end of the first month, the heart begins to beat. Second month  The bones, inner ear, eyelids, hands, and feet form.  The genitals develop.  By the end of 8 weeks, all major organs are developing. Third month  All of the internal organs are forming.  Teeth develop below the gums.  Bones and muscles begin to grow. The spine can flex.  The skin is transparent.  Fingernails and toenails begin to form.  Arms and legs continue to grow longer, and hands and feet develop.  The fetus is about 3 inches (7.6 cm) long. Fourth month  The placenta is completely formed.  The external sex organs, neck, outer ear, eyebrows, eyelids, and fingernails are formed.  The fetus can hear, swallow, and move its arms and legs.  The kidneys begin to produce urine.  The skin is covered with a white, waxy coating (vernix) and very fine hair (lanugo). Fifth month  The fetus moves around more and can be felt for the first time (quickening).  The fetus starts to sleep and wake up and may begin to suck its finger.  The nails grow to the end of the fingers.  The organ in the digestive system that makes bile (gallbladder) functions and helps to digest nutrients.  If your baby is a girl, eggs are present in her ovaries. If your baby is a boy, testicles start to move down into his scrotum. Sixth month  The lungs are formed.  The eyes open. The brain continues to develop.  Your baby has fingerprints and toe prints. Your baby's hair grows thicker.  At the end of the second trimester, the fetus is about 9 inches (22.9 cm) long. Seventh month  The fetus kicks and stretches.  The eyes are developed enough to sense changes in light.  The hands can make a grasping motion.  The fetus responds to sound. Eighth month  All organs and body systems are fully developed and functioning.  Bones harden, and taste buds develop. The fetus may  hiccup.  Certain areas of the brain are still developing. The skull remains soft. Ninth month  The fetus gains about  lb (0.23 kg) each week.  The lungs are fully developed.  Patterns of sleep develop.  The fetus's head typically moves into a head-down position (vertex) in the uterus to prepare for birth.  The fetus weighs 6-9 lb (2.72-4.08 kg) and is   19-20 inches (48.26-50.8 cm) long. What can I do to have a healthy pregnancy and help my baby develop? General instructions  Take prenatal vitamins as directed by your health care provider. These include vitamins such as folic acid, iron, calcium, and vitamin D. They are important for healthy development.  Take medicines only as directed by your health care provider. Read labels and ask a pharmacist or your health care provider whether over-the-counter medicines, supplements, and prescription drugs are safe to take during pregnancy.  Keep all follow-up visits as directed by your health care provider. This is important. Follow-up visits include prenatal care and screening tests. How do I know if my baby is developing well? At each prenatal visit, your health care provider will do several different tests to check on your health and keep track of your baby's development. These include:  Fundal height and position. ? Your health care provider will measure your growing belly from your pubic bone to the top of the uterus using a tape measure. ? Your health care provider will also feel your belly to determine your baby's position.  Heartbeat. ? An ultrasound in the first trimester can confirm pregnancy and show a heartbeat, depending on how far along you are. ? Your health care provider will check your baby's heart rate at every prenatal visit.  Second trimester ultrasound. ? This ultrasound checks your baby's development. It also may show your baby's gender. What should I do if I have concerns about my baby's development? Always talk with  your health care provider about any concerns that you may have about your pregnancy and your baby. Summary  A pregnancy usually lasts 280 days, or about 40 weeks. Pregnancy is divided into three periods of growth, also called trimesters.  Your health care provider will monitor your baby's growth and development throughout your pregnancy.  Follow your health care provider's recommendations about taking prenatal vitamins and medicines during your pregnancy.  Talk with your health care provider if you have any concerns about your pregnancy or your developing baby. This information is not intended to replace advice given to you by your health care provider. Make sure you discuss any questions you have with your health care provider. Document Revised: 09/01/2018 Document Reviewed: 03/24/2017 Elsevier Patient Education  2020 [employer]. First Trimester of Pregnancy  The first trimester of pregnancy is from week 1 until the end of week 13 (months 1 through 3). During this time, your baby will begin to develop inside you. At 6-8 weeks, the eyes and face are formed, and the heartbeat can be seen on ultrasound. At the end of 12 weeks, all the baby's organs are formed. Prenatal care is all the medical care you receive before the birth of your baby. Make sure you get good prenatal care and follow all of your doctor's instructions. Follow these instructions at home: Medicines  Take over-the-counter and prescription medicines only as told by your doctor. Some medicines are safe and some medicines are not safe during pregnancy.  Take a prenatal vitamin that contains at least 600 micrograms (mcg) of folic acid.  If you have trouble pooping (constipation), take medicine that will make your stool soft (stool softener) if your doctor approves. Eating and drinking   Eat regular, healthy meals.  Your doctor will tell you the amount of weight gain that is right for you.  Avoid raw meat and uncooked  cheese.  If you feel sick to your stomach (nauseous) or throw up (vomit): ? Eat   4 or 5 small meals a day instead of 3 large meals. ? Try eating a few soda crackers. ? Drink liquids between meals instead of during meals.  To prevent constipation: ? Eat foods that are high in fiber, like fresh fruits and vegetables, whole grains, and beans. ? Drink enough fluids to keep your pee (urine) clear or pale yellow. Activity  Exercise only as told by your doctor. Stop exercising if you have cramps or pain in your lower belly (abdomen) or low back.  Do not exercise if it is too hot, too humid, or if you are in a place of great height (high altitude).  Try to avoid standing for long periods of time. Move your legs often if you must stand in one place for a long time.  Avoid heavy lifting.  Wear low-heeled shoes. Sit and stand up straight.  You can have sex unless your doctor tells you not to. Relieving pain and discomfort  Wear a good support bra if your breasts are sore.  Take warm water baths (sitz baths) to soothe pain or discomfort caused by hemorrhoids. Use hemorrhoid cream if your doctor says it is okay.  Rest with your legs raised if you have leg cramps or low back pain.  If you have puffy, bulging veins (varicose veins) in your legs: ? Wear support hose or compression stockings as told by your doctor. ? Raise (elevate) your feet for 15 minutes, 3-4 times a day. ? Limit salt in your food. Prenatal care  Schedule your prenatal visits by the twelfth week of pregnancy.  Write down your questions. Take them to your prenatal visits.  Keep all your prenatal visits as told by your doctor. This is important. Safety  Wear your seat belt at all times when driving.  Make a list of emergency phone numbers. The list should include numbers for family, friends, the hospital, and police and fire departments. General instructions  Ask your doctor for a referral to a local prenatal class.  Begin classes no later than at the start of month 6 of your pregnancy.  Ask for help if you need counseling or if you need help with nutrition. Your doctor can give you advice or tell you where to go for help.  Do not use hot tubs, steam rooms, or saunas.  Do not douche or use tampons or scented sanitary pads.  Do not cross your legs for long periods of time.  Avoid all herbs and alcohol. Avoid drugs that are not approved by your doctor.  Do not use any tobacco products, including cigarettes, chewing tobacco, and electronic cigarettes. If you need help quitting, ask your doctor. You may get counseling or other support to help you quit.  Avoid cat litter boxes and soil used by cats. These carry germs that can cause birth defects in the baby and can cause a loss of your baby (miscarriage) or stillbirth.  Visit your dentist. At home, brush your teeth with a soft toothbrush. Be gentle when you floss. Contact a doctor if:  You are dizzy.  You have mild cramps or pressure in your lower belly.  You have a nagging pain in your belly area.  You continue to feel sick to your stomach, you throw up, or you have watery poop (diarrhea).  You have a bad smelling fluid coming from your vagina.  You have pain when you pee (urinate).  You have increased puffiness (swelling) in your face, hands, legs, or ankles. Get help right away   if:  You have a fever.  You are leaking fluid from your vagina.  You have spotting or bleeding from your vagina.  You have very bad belly cramping or pain.  You gain or lose weight rapidly.  You throw up blood. It may look like coffee grounds.  You are around people who have German measles, fifth disease, or chickenpox.  You have a very bad headache.  You have shortness of breath.  You have any kind of trauma, such as from a fall or a car accident. Summary  The first trimester of pregnancy is from week 1 until the end of week 13 (months 1 through 3).   To take care of yourself and your unborn baby, you will need to eat healthy meals, take medicines only if your doctor tells you to do so, and do activities that are safe for you and your baby.  Keep all follow-up visits as told by your doctor. This is important as your doctor will have to ensure that your baby is healthy and growing well. This information is not intended to replace advice given to you by your health care provider. Make sure you discuss any questions you have with your health care provider. Document Revised: 09/01/2018 Document Reviewed: 05/19/2016 Elsevier Patient Education  2020 [employer]. Commonly Asked Questions During Pregnancy  Cats: A parasite can be excreted in cat feces.  To avoid exposure you need to have another person empty the little box.  If you must empty the litter box you will need to wear gloves.  Wash your hands after handling your cat.  This parasite can also be found in raw or undercooked meat so this should also be avoided.  Colds, Sore Throats, Flu: Please check your medication sheet to see what you can take for symptoms.  If your symptoms are unrelieved by these medications please call the office.  Dental Work: Most any dental work your dentist recommends is permitted.  X-rays should only be taken during the first trimester if absolutely necessary.  Your abdomen should be shielded with a lead apron during all x-rays.  Please notify your provider prior to receiving any x-rays.  Novocaine is fine; gas is not recommended.  If your dentist requires a note from us prior to dental work please call the office and we will provide one for you.  Exercise: Exercise is an important part of staying healthy during your pregnancy.  You may continue most exercises you were accustomed to prior to pregnancy.  Later in your pregnancy you will most likely notice you have difficulty with activities requiring balance like riding a bicycle.  It is important that you listen to your  body and avoid activities that put you at a higher risk of falling.  Adequate rest and staying well hydrated are a must!  If you have questions about the safety of specific activities ask your provider.    Exposure to Children with illness: Try to avoid obvious exposure; report any symptoms to us when noted,  If you have chicken pos, red measles or mumps, you should be immune to these diseases.   Please do not take any vaccines while pregnant unless you have checked with your OB provider.  Fetal Movement: After 28 weeks we recommend you do "kick counts" twice daily.  Lie or sit down in a calm quiet environment and count your baby movements "kicks".  You should feel your baby at least 10 times per hour.  If you have not felt   10 kicks within the first hour get up, walk around and have something sweet to eat or drink then repeat for an additional hour.  If count remains less than 10 per hour notify your provider.  Fumigating: Follow your pest control agent's advice as to how long to stay out of your home.  Ventilate the area well before re-entering.  Hemorrhoids:   Most over-the-counter preparations can be used during pregnancy.  Check your medication to see what is safe to use.  It is important to use a stool softener or fiber in your diet and to drink lots of liquids.  If hemorrhoids seem to be getting worse please call the office.   Hot Tubs:  Hot tubs Jacuzzis and saunas are not recommended while pregnant.  These increase your internal body temperature and should be avoided.  Intercourse:  Sexual intercourse is safe during pregnancy as long as you are comfortable, unless otherwise advised by your provider.  Spotting may occur after intercourse; report any bright red bleeding that is heavier than spotting.  Labor:  If you know that you are in labor, please go to the hospital.  If you are unsure, please call the office and let us help you decide what to do.  Lifting, straining, etc:  If your job  requires heavy lifting or straining please check with your provider for any limitations.  Generally, you should not lift items heavier than that you can lift simply with your hands and arms (no back muscles)  Painting:  Paint fumes do not harm your pregnancy, but may make you ill and should be avoided if possible.  Latex or water based paints have less odor than oils.  Use adequate ventilation while painting.  Permanents & Hair Color:  Chemicals in hair dyes are not recommended as they cause increase hair dryness which can increase hair loss during pregnancy.  " Highlighting" and permanents are allowed.  Dye may be absorbed differently and permanents may not hold as well during pregnancy.  Sunbathing:  Use a sunscreen, as skin burns easily during pregnancy.  Drink plenty of fluids; avoid over heating.  Tanning Beds:  Because their possible side effects are still unknown, tanning beds are not recommended.  Ultrasound Scans:  Routine ultrasounds are performed at approximately 20 weeks.  You will be able to see your baby's general anatomy an if you would like to know the gender this can usually be determined as well.  If it is questionable when you conceived you may also receive an ultrasound early in your pregnancy for dating purposes.  Otherwise ultrasound exams are not routinely performed unless there is a medical necessity.  Although you can request a scan we ask that you pay for it when conducted because insurance does not cover " patient request" scans.  Work: If your pregnancy proceeds without complications you may work until your due date, unless your physician or employer advises otherwise.  Round Ligament Pain/Pelvic Discomfort:  Sharp, shooting pains not associated with bleeding are fairly common, usually occurring in the second trimester of pregnancy.  They tend to be worse when standing up or when you remain standing for long periods of time.  These are the result of pressure of certain  pelvic ligaments called "round ligaments".  Rest, Tylenol and heat seem to be the most effective relief.  As the womb and fetus grow, they rise out of the pelvis and the discomfort improves.  Please notify the office if your pain seems different than that   described.  It may represent a more serious condition.   Common Medications Safe in Pregnancy  Acne:      Constipation:  Benzoyl Peroxide     Colace  Clindamycin      Dulcolax Suppository  Topica Erythromycin     Fibercon  Salicylic Acid      Metamucil         Miralax AVOID:        Senakot   Accutane    Cough:  Retin-A       Cough Drops  Tetracycline      Phenergan w/ Codeine if Rx  Minocycline      Robitussin (Plain & DM)  Antibiotics:     Crabs/Lice:  Ceclor       RID  Cephalosporins    AVOID:  E-Mycins      Kwell  Keflex  Macrobid/Macrodantin   Diarrhea:  Penicillin      Kao-Pectate  Zithromax      Imodium AD         PUSH FLUIDS AVOID:       Cipro     Fever:  Tetracycline      Tylenol (Regular or Extra  Minocycline       Strength)  Levaquin      Extra Strength-Do not          Exceed 8 tabs/24 hrs Caffeine:        <200mg/day (equiv. To 1 cup of coffee or  approx. 3 12 oz sodas)         Gas: Cold/Hayfever:       Gas-X  Benadryl      Mylicon  Claritin       Phazyme  **Claritin-D        Chlor-Trimeton    Headaches:  Dimetapp      ASA-Free Excedrin  Drixoral-Non-Drowsy     Cold Compress  Mucinex (Guaifenasin)     Tylenol (Regular or Extra  Sudafed/Sudafed-12 Hour     Strength)  **Sudafed PE Pseudoephedrine   Tylenol Cold & Sinus     Vicks Vapor Rub  Zyrtec  **AVOID if Problems With Blood Pressure         Heartburn: Avoid lying down for at least 1 hour after meals  Aciphex      Maalox     Rash:  Milk of Magnesia     Benadryl    Mylanta       1% Hydrocortisone Cream  Pepcid  Pepcid Complete   Sleep Aids:  Prevacid      Ambien   Prilosec       Benadryl  Rolaids       Chamomile Tea  Tums (Limit 4/day)      Unisom         Tylenol PM         Warm milk-add vanilla or  Hemorrhoids:       Sugar for taste  Anusol/Anusol H.C.  (RX: Analapram 2.5%)  Sugar Substitutes:  Hydrocortisone OTC     Ok in moderation  Preparation H      Tucks        Vaseline lotion applied to tissue with wiping    Herpes:     Throat:  Acyclovir      Oragel  Famvir  Valtrex     Vaccines:         Flu Shot Leg Cramps:       *Gardasil  Benadryl      Hepatitis A           Pneumovax  Saline Nasal Spray     Polio Booster         Tetanus Nausea:       Tuberculosis test or PPD  Vitamin B6 25 mg TID   AVOID:    Dramamine      *Gardasil  Emetrol       Live Poliovirus  Ginger Root 250 mg QID    MMR (measles, mumps &  High Complex Carbs @ Bedtime    rebella)  Sea Bands-Accupressure    Varicella (Chickenpox)  Unisom 1/2 tab TID     *No known complications           If received before Pain:         Known pregnancy;   Darvocet       Resume series after  Lortab        Delivery  Percocet    Yeast:   Tramadol      Femstat  Tylenol 3      Gyne-lotrimin  Ultram       Monistat  Vicodin           MISC:         All Sunscreens           Hair Coloring/highlights          Insect Repellant's          (Including DEET)         Mystic Tans  

## 2019-12-21 NOTE — Telephone Encounter (Signed)
Patient stated that her job said there is no light duty. They have taken her out of work and wants [place] to fill out short term disability forms.

## 2019-12-21 NOTE — Progress Notes (Signed)
      Tiffany Bowen presents for NOB nurse intake visit. Pregnancy confirmation done at [facility], 11/21/2019, with Shaune Pollack, MD.  G 7.  P 1141.  LMP 10/15/2019.  EDD 07/22/2019.  Ga [redacted]w[redacted]d. Pregnancy education material explained and given.  0 cats in the home.  NOB labs ordered. BMI less than 30. TSH/HbgA1c not ordered. Sickle cell order due to race. HIV and drug screen explained and ordered. Genetic screening discussed. Genetic testing; Unsure. Pt to discuss genetic testing with provider. PNV encouraged. Pt to follow up with provider in 3 weeks for NOB physical.  [facility] Financial Policy and FLMA Form signed and completed by pt.

## 2019-12-22 LAB — RUBELLA SCREEN: Rubella Antibodies, IGG: 1.04 index (ref >0.99)

## 2019-12-22 LAB — MICROSCOPIC EXAMINATION
Casts: NONE SEEN /lpf
Epithelial Cells (non renal): >10 /hpf — AB (ref 0–10)

## 2019-12-22 LAB — URINALYSIS, ROUTINE W REFLEX MICROSCOPIC
Bilirubin, UA: NEGATIVE
Glucose, UA: NEGATIVE
Nitrite, UA: NEGATIVE
Specific Gravity, UA: 1.025 (ref 1.005–1.030)
Urobilinogen, Ur: 1 mg/dL (ref 0.2–1.0)
pH, UA: 7 (ref 5.0–7.5)

## 2019-12-22 LAB — HEPATITIS B SURFACE ANTIGEN: Hepatitis B Surface Ag: NEGATIVE

## 2019-12-22 LAB — ABO AND RH: Rh Factor: POSITIVE

## 2019-12-22 LAB — HIV ANTIBODY (ROUTINE TESTING W REFLEX): HIV Screen 4th Generation wRfx: NONREACTIVE

## 2019-12-22 LAB — ANTIBODY SCREEN: Antibody Screen: NEGATIVE

## 2019-12-22 LAB — VARICELLA ZOSTER ANTIBODY, IGG: Varicella zoster IgG: 306 index (ref >165)

## 2019-12-22 LAB — HGB SOLU + RFLX FRAC: Sickle Solubility Test - HGBRFX: NEGATIVE

## 2019-12-22 LAB — RPR: RPR Ser Ql: NONREACTIVE

## 2019-12-22 NOTE — Telephone Encounter (Signed)
Please advise 

## 2019-12-23 LAB — GC/CHLAMYDIA PROBE AMP
Chlamydia trachomatis, NAA: NEGATIVE
Neisseria Gonorrhoeae by PCR: NEGATIVE

## 2019-12-24 LAB — URINE CULTURE, OB REFLEX

## 2019-12-24 LAB — CULTURE, OB URINE

## 2019-12-27 ENCOUNTER — Encounter: Payer: Self-pay | Admitting: Surgical

## 2019-12-27 NOTE — Telephone Encounter (Signed)
Spoke with patient and gave her the message that Dr. Logan Bores sent. I have sent it in a my chart message as well. Patient verbalized understanding.

## 2020-01-03 ENCOUNTER — Encounter: Payer: Self-pay | Admitting: Surgical

## 2020-01-03 LAB — DRUG PROFILE, UR, 9 DRUGS (LABCORP)
Amphetamines, Urine: NEGATIVE ng/mL
Barbiturate Quant, Ur: NEGATIVE ng/mL
Benzodiazepine Quant, Ur: NEGATIVE ng/mL
Cocaine (Metab.): NEGATIVE ng/mL
Methadone Screen, Urine: NEGATIVE ng/mL
Opiate Quant, Ur: NEGATIVE ng/mL
PCP Quant, Ur: NEGATIVE ng/mL
Propoxyphene: NEGATIVE ng/mL

## 2020-01-03 LAB — NICOTINE SCREEN, URINE: Cotinine Ql Scrn, Ur: NEGATIVE ng/mL

## 2020-01-11 ENCOUNTER — Encounter: Payer: Self-pay | Admitting: Obstetrics and Gynecology

## 2020-01-11 ENCOUNTER — Other Ambulatory Visit: Payer: Self-pay

## 2020-01-11 ENCOUNTER — Ambulatory Visit (INDEPENDENT_AMBULATORY_CARE_PROVIDER_SITE_OTHER): Payer: 59 | Admitting: Obstetrics and Gynecology

## 2020-01-11 VITALS — BP 101/67 | HR 71 | Wt 147.1 lb

## 2020-01-11 DIAGNOSIS — Z3481 Encounter for supervision of other normal pregnancy, first trimester: Secondary | ICD-10-CM | POA: Diagnosis not present

## 2020-01-11 DIAGNOSIS — Z3A12 12 weeks gestation of pregnancy: Secondary | ICD-10-CM | POA: Diagnosis not present

## 2020-01-11 NOTE — Progress Notes (Signed)
NOB:    Nausea and vomiting resolved.  Patient feeling much better.  Taking vitamins as directed.  Desires maternity 21 today.  Possible AFP next visit.  Physical examination General NAD, Conversant  HEENT Atraumatic; Op clear with mmm.  Normo-cephalic. Pupils reactive. Anicteric sclerae  Thyroid/Neck Smooth without nodularity or enlargement. Normal ROM.  Neck Supple.  Skin No rashes, lesions or ulceration. Normal palpated skin turgor. No nodularity.  Breasts: No masses or discharge.  Symmetric.  No axillary adenopathy.  Lungs: Clear to auscultation.No rales or wheezes. Normal Respiratory effort, no retractions.  Heart: NSR.  No murmurs or rubs appreciated. No periferal edema  Abdomen: Soft.  Non-tender.  No masses.  No HSM. No hernia  Extremities: Moves all appropriately.  Normal ROM for age. No lymphadenopathy.  Neuro: Oriented to PPT.  Normal mood. Normal affect.     Pelvic:   Vulva: Normal appearance.  No lesions.  Vagina: No lesions or abnormalities noted.  Support: Normal pelvic support.  Urethra No masses tenderness or scarring.  Meatus Normal size without lesions or prolapse.  Cervix: Normal appearance.  No lesions.  Anus: Normal exam.  No lesions.  Perineum: Normal exam.  No lesions.        Bimanual   Adnexae: No masses.  Non-tender to palpation.  Uterus: Enlarged. 14wks Pos FHT's  Non-tender.  Mobile.  AV.  Adnexae: No masses.  Non-tender to palpation.  Cul-de-sac: Negative for abnormality.  Adnexae: No masses.  Non-tender to palpation.         Pelvimetry   Diagonal: Reached.  Spines: Average.  Sacrum: Concave.  Pubic Arch: Normal.

## 2020-01-12 LAB — CBC
Hematocrit: 33.6 % — ABNORMAL LOW (ref 34.0–46.6)
Hemoglobin: 11.2 g/dL (ref 11.1–15.9)
MCH: 29.1 pg (ref 26.6–33.0)
MCHC: 33.3 g/dL (ref 31.5–35.7)
MCV: 87 fL (ref 79–97)
Platelets: 203 10*3/uL (ref 150–450)
RBC: 3.85 x10E6/uL (ref 3.77–5.28)
RDW: 12.8 % (ref 11.7–15.4)
WBC: 6.5 10*3/uL (ref 3.4–10.8)

## 2020-01-16 LAB — MATERNIT21  PLUS CORE+ESS+SCA, BLOOD

## 2020-01-17 ENCOUNTER — Telehealth: Payer: Self-pay | Admitting: Obstetrics and Gynecology

## 2020-01-17 NOTE — Telephone Encounter (Signed)
Pt called in and stated that she  would like her gender results printed and for her sister Tiffany Bowen called [phone] when they are ready to pick up. I told the pt I would send a message to the nurse. The pt verbally understood. Please advise

## 2020-01-18 NOTE — Telephone Encounter (Signed)
Spoke with patients sister to let her know that gender results will be up front for her to pick up.

## 2020-01-30 ENCOUNTER — Telehealth: Payer: Self-pay

## 2020-01-30 NOTE — Telephone Encounter (Signed)
Received paperwork via fax from Betsey Holiday RN requesting information on patient. Voicemail message left for RN to please return my call.

## 2020-02-08 ENCOUNTER — Other Ambulatory Visit: Payer: Self-pay

## 2020-02-08 ENCOUNTER — Ambulatory Visit (INDEPENDENT_AMBULATORY_CARE_PROVIDER_SITE_OTHER): Payer: 59 | Admitting: Obstetrics and Gynecology

## 2020-02-08 ENCOUNTER — Encounter: Payer: Self-pay | Admitting: Obstetrics and Gynecology

## 2020-02-08 VITALS — BP 96/57 | HR 84 | Wt 146.8 lb

## 2020-02-08 DIAGNOSIS — Z23 Encounter for immunization: Secondary | ICD-10-CM | POA: Diagnosis not present

## 2020-02-08 DIAGNOSIS — O2622 Pregnancy care for patient with recurrent pregnancy loss, second trimester: Secondary | ICD-10-CM

## 2020-02-08 DIAGNOSIS — O0992 Supervision of high risk pregnancy, unspecified, second trimester: Secondary | ICD-10-CM

## 2020-02-08 DIAGNOSIS — Z1379 Encounter for other screening for genetic and chromosomal anomalies: Secondary | ICD-10-CM

## 2020-02-08 DIAGNOSIS — Z3490 Encounter for supervision of normal pregnancy, unspecified, unspecified trimester: Secondary | ICD-10-CM | POA: Insufficient documentation

## 2020-02-08 DIAGNOSIS — Z3A16 16 weeks gestation of pregnancy: Secondary | ICD-10-CM | POA: Diagnosis not present

## 2020-02-08 DIAGNOSIS — Z9889 Other specified postprocedural states: Secondary | ICD-10-CM

## 2020-02-08 LAB — POCT URINALYSIS DIPSTICK OB
Bilirubin, UA: NEGATIVE
Blood, UA: NEGATIVE
Glucose, UA: NEGATIVE
Ketones, UA: NEGATIVE
Nitrite, UA: NEGATIVE
POC,PROTEIN,UA: NEGATIVE
Spec Grav, UA: 1.015 (ref 1.010–1.025)
Urobilinogen, UA: 1 E.U./dL
pH, UA: 7 (ref 5.0–8.0)

## 2020-02-08 MED ORDER — ASPIRIN EC 81 MG PO TBEC: 81.0000 mg | DELAYED_RELEASE_TABLET | Freq: Every day | ORAL | 2 refills | Status: DC

## 2020-02-08 NOTE — Patient Instructions (Signed)
WHAT OB PATIENTS CAN EXPECT   Confirmation of pregnancy and ultrasound ordered if medically indicated-[redacted] weeks gestation  New OB (NOB) intake with nurse and New OB (NOB) labs- [redacted] weeks gestation  New OB (NOB) physical examination with provider- 11/[redacted] weeks gestation  Flu vaccine-[redacted] weeks gestation  Anatomy scan-[redacted] weeks gestation  Glucose tolerance test, blood work to test for anemia, T-dap vaccine-[redacted] weeks gestation  Vaginal swabs/cultures-STD/Group B strep-[redacted] weeks gestation  Appointments every 4 weeks until 28 weeks  Every 2 weeks from 28 weeks until 36 weeks  Weekly visits from 36 weeks until delivery  Second Trimester of Pregnancy  The second trimester is from week 14 through week 27 (month 4 through 6). This is often the time in pregnancy that you feel your best. Often times, morning sickness has lessened or quit. You may have more energy, and you may get hungry more often. Your unborn baby is growing rapidly. At the end of the sixth month, he or she is about 9 inches long and weighs about 1 pounds. You will likely feel the baby move between 18 and 20 weeks of pregnancy. Follow these instructions at home: Medicines  Take over-the-counter and prescription medicines only as told by your doctor. Some medicines are safe and some medicines are not safe during pregnancy.  Take a prenatal vitamin that contains at least 600 micrograms (mcg) of folic acid.  If you have trouble pooping (constipation), take medicine that will make your stool soft (stool softener) if your doctor approves. Eating and drinking   Eat regular, healthy meals.  Avoid raw meat and uncooked cheese.  If you get low calcium from the food you eat, talk to your doctor about taking a daily calcium supplement.  Avoid foods that are high in fat and sugars, such as fried and sweet foods.  If you feel sick to your stomach (nauseous) or throw up (vomit): ? Eat 4 or 5 small meals a day instead of 3 large  meals. ? Try eating a few soda crackers. ? Drink liquids between meals instead of during meals.  To prevent constipation: ? Eat foods that are high in fiber, like fresh fruits and vegetables, whole grains, and beans. ? Drink enough fluids to keep your pee (urine) clear or pale yellow. Activity  Exercise only as told by your doctor. Stop exercising if you start to have cramps.  Do not exercise if it is too hot, too humid, or if you are in a place of great height (high altitude).  Avoid heavy lifting.  Wear low-heeled shoes. Sit and stand up straight.  You can continue to have sex unless your doctor tells you not to. Relieving pain and discomfort  Wear a good support bra if your breasts are tender.  Take warm water baths (sitz baths) to soothe pain or discomfort caused by hemorrhoids. Use hemorrhoid cream if your doctor approves.  Rest with your legs raised if you have leg cramps or low back pain.  If you develop puffy, bulging veins (varicose veins) in your legs: ? Wear support hose or compression stockings as told by your doctor. ? Raise (elevate) your feet for 15 minutes, 3-4 times a day. ? Limit salt in your food. Prenatal care  Write down your questions. Take them to your prenatal visits.  Keep all your prenatal visits as told by your doctor. This is important. Safety  Wear your seat belt when driving.  Make a list of emergency phone numbers, including numbers for family, friends, the  hospital, and police and fire departments. General instructions  Ask your doctor about the right foods to eat or for help finding a counselor, if you need these services.  Ask your doctor about local prenatal classes. Begin classes before month 6 of your pregnancy.  Do not use hot tubs, steam rooms, or saunas.  Do not douche or use tampons or scented sanitary pads.  Do not cross your legs for long periods of time.  Visit your dentist if you have not done so. Use a soft toothbrush  to brush your teeth. Floss gently.  Avoid all smoking, herbs, and alcohol. Avoid drugs that are not approved by your doctor.  Do not use any products that contain nicotine or tobacco, such as cigarettes and e-cigarettes. If you need help quitting, ask your doctor.  Avoid cat litter boxes and soil used by cats. These carry germs that can cause birth defects in the baby and can cause a loss of your baby (miscarriage) or stillbirth. Contact a doctor if:  You have mild cramps or pressure in your lower belly.  You have pain when you pee (urinate).  You have bad smelling fluid coming from your vagina.  You continue to feel sick to your stomach (nauseous), throw up (vomit), or have watery poop (diarrhea).  You have a nagging pain in your belly area.  You feel dizzy. Get help right away if:  You have a fever.  You are leaking fluid from your vagina.  You have spotting or bleeding from your vagina.  You have severe belly cramping or pain.  You lose or gain weight rapidly.  You have trouble catching your breath and have chest pain.  You notice sudden or extreme puffiness (swelling) of your face, hands, ankles, feet, or legs.  You have not felt the baby move in over an hour.  You have severe headaches that do not go away when you take medicine.  You have trouble seeing. Summary  The second trimester is from week 14 through week 27 (months 4 through 6). This is often the time in pregnancy that you feel your best.  To take care of yourself and your unborn baby, you will need to eat healthy meals, take medicines only if your doctor tells you to do so, and do activities that are safe for you and your baby.  Call your doctor if you get sick or if you notice anything unusual about your pregnancy. Also, call your doctor if you need help with the right food to eat, or if you want to know what activities are safe for you. This information is not intended to replace advice given to you by  your health care provider. Make sure you discuss any questions you have with your health care provider. Document Revised: 09/02/2018 Document Reviewed: 06/16/2016 Elsevier Patient Education  [place]. Common Medications Safe in Pregnancy  Acne:      Constipation:  Benzoyl Peroxide     Colace  Clindamycin      Dulcolax Suppository  Topica Erythromycin     Fibercon  Salicylic Acid      Metamucil         Miralax AVOID:        Senakot   Accutane    Cough:  Retin-A       Cough Drops  Tetracycline      Phenergan w/ Codeine if Rx  Minocycline      Robitussin (Plain & DM)  Antibiotics:     Crabs/Lice:  Ceclor       RID  Cephalosporins    AVOID:  E-Mycins      Kwell  Keflex  Macrobid/Macrodantin   Diarrhea:  Penicillin      Kao-Pectate  Zithromax      Imodium AD         PUSH FLUIDS AVOID:       Cipro     Fever:  Tetracycline      Tylenol (Regular or Extra  Minocycline       Strength)  Levaquin      Extra Strength-Do not          Exceed 8 tabs/24 hrs Caffeine:        <259m/day (equiv. To 1 cup of coffee or  approx. 3 12 oz sodas)         Gas: Cold/Hayfever:       Gas-X  Benadryl      Mylicon  Claritin       Phazyme  **Claritin-D        Chlor-Trimeton    Headaches:  Dimetapp      ASA-Free Excedrin  Drixoral-Non-Drowsy     Cold Compress  Mucinex (Guaifenasin)     Tylenol (Regular or Extra  Sudafed/Sudafed-12 Hour     Strength)  **Sudafed PE Pseudoephedrine   Tylenol Cold & Sinus     Vicks Vapor Rub  Zyrtec  **AVOID if Problems With Blood Pressure         Heartburn: Avoid lying down for at least 1 hour after meals  Aciphex      Maalox     Rash:  Milk of Magnesia     Benadryl    Mylanta       1% Hydrocortisone Cream  Pepcid  Pepcid Complete   Sleep Aids:  Prevacid      Ambien   Prilosec       Benadryl  Rolaids       Chamomile Tea  Tums (Limit 4/day)     Unisom         Tylenol PM         Warm milk-add vanilla or  Hemorrhoids:       Sugar for  taste  Anusol/Anusol H.C.  (RX: Analapram 2.5%)  Sugar Substitutes:  Hydrocortisone OTC     Ok in moderation  Preparation H      Tucks        Vaseline lotion applied to tissue with wiping    Herpes:     Throat:  Acyclovir      Oragel  Famvir  Valtrex     Vaccines:         Flu Shot Leg Cramps:       *Gardasil  Benadryl      Hepatitis A         Hepatitis B Nasal Spray:       Pneumovax  Saline Nasal Spray     Polio Booster         Tetanus Nausea:       Tuberculosis test or PPD  Vitamin B6 25 mg TID   AVOID:    Dramamine      *Gardasil  Emetrol       Live Poliovirus  Ginger Root 250 mg QID    MMR (measles, mumps &  High Complex Carbs @ Bedtime    rebella)  Sea Bands-Accupressure    Varicella (Chickenpox)  Unisom 1/2 tab TID     *No known complications  If received before Pain:         Known pregnancy;   Darvocet       Resume series after  Lortab        Delivery  Percocet    Yeast:   Tramadol      Femstat  Tylenol 3      Gyne-lotrimin  Ultram       Monistat  Vicodin           MISC:         All Sunscreens           Hair Coloring/highlights          Insect Repellant's          (Including DEET)         Mystic Tans

## 2020-02-08 NOTE — Progress Notes (Signed)
ROB: Doing well no major complaints. 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 Also with h/o LEEP last year. Discussion had on starting baby aspirin for remainder of pregnancy. Will order.  Was on 17-OHP for last pregnancy, but discussed with patient that it was not currently indicated. Patient noted that she was planning on not using this pregnancy.  Did discuss antenatal testing after 24-28 weeks due to history of loss. Normal MaterniT21, for AFP today.  RTC in 4 weeks, for anatomy scan. Flu vaccine given.

## 2020-02-08 NOTE — Progress Notes (Signed)
ROB-Pt present for routine prenatal care. Pt stated that she is doing well no problems. Flu vaccine administered.

## 2020-02-09 ENCOUNTER — Telehealth: Payer: Self-pay

## 2020-02-09 NOTE — Telephone Encounter (Signed)
mychart message sent- back to work letter done.

## 2020-02-13 LAB — AFP, SERUM, OPEN SPINA BIFIDA
AFP MoM: 1.05
AFP Value: 43.4 ng/mL
Gest. Age on Collection Date: 16.6 weeks
Maternal Age At EDD: 31.6 yr
OSBR Risk 1 IN: 10000
Test Results:: NEGATIVE
Weight: 147 [lb_av]

## 2020-02-20 ENCOUNTER — Encounter: Payer: Self-pay | Admitting: Surgical

## 2020-03-07 ENCOUNTER — Ambulatory Visit (INDEPENDENT_AMBULATORY_CARE_PROVIDER_SITE_OTHER): Payer: Medicaid Other | Admitting: Obstetrics and Gynecology

## 2020-03-07 ENCOUNTER — Ambulatory Visit (INDEPENDENT_AMBULATORY_CARE_PROVIDER_SITE_OTHER): Payer: Medicaid Other

## 2020-03-07 ENCOUNTER — Other Ambulatory Visit: Payer: Self-pay

## 2020-03-07 ENCOUNTER — Encounter: Payer: Self-pay | Admitting: Obstetrics and Gynecology

## 2020-03-07 VITALS — BP 106/66 | HR 63 | Wt 156.6 lb

## 2020-03-07 DIAGNOSIS — Z3A20 20 weeks gestation of pregnancy: Secondary | ICD-10-CM

## 2020-03-07 DIAGNOSIS — O0992 Supervision of high risk pregnancy, unspecified, second trimester: Secondary | ICD-10-CM

## 2020-03-07 NOTE — Progress Notes (Signed)
ROB: She has no complaints.  She is has started to feel the baby move.  FAS today normal.  She is taking aspirin and prenatal vitamins as directed.  We again discussed the possibility of antenatal testing with NSTs beginning at 28 weeks.  All questions answered.

## 2020-04-03 ENCOUNTER — Encounter: Payer: Self-pay | Admitting: Obstetrics and Gynecology

## 2020-04-03 ENCOUNTER — Ambulatory Visit (INDEPENDENT_AMBULATORY_CARE_PROVIDER_SITE_OTHER): Payer: Medicaid Other | Admitting: Obstetrics and Gynecology

## 2020-04-03 ENCOUNTER — Other Ambulatory Visit: Payer: Self-pay

## 2020-04-03 VITALS — BP 101/66 | HR 63 | Wt 164.6 lb

## 2020-04-03 DIAGNOSIS — O0992 Supervision of high risk pregnancy, unspecified, second trimester: Secondary | ICD-10-CM

## 2020-04-03 DIAGNOSIS — O09291 Supervision of pregnancy with other poor reproductive or obstetric history, first trimester: Secondary | ICD-10-CM

## 2020-04-03 DIAGNOSIS — Z3A24 24 weeks gestation of pregnancy: Secondary | ICD-10-CM

## 2020-04-03 NOTE — Progress Notes (Signed)
ROB-Pt present for routine prenatal care. Pt stated that she was doing well no problems.  

## 2020-04-03 NOTE — Progress Notes (Signed)
ROB: Doing well, no major issues. For 28 week labs next visit In 4 weeks.   The following were addressed during this visit:  Breastfeeding Education - Early initiation of breastfeeding    Comments: Keeps milk supply adequate, helps contract uterus and slow bleeding, and early milk is the perfect first food and is easy to digest.   - The importance of exclusive breastfeeding    Comments: Provides antibodies, Lower risk of breast and ovarian cancers, and type-2 diabetes,Helps your body recover, Reduced chance of SIDS.   - The importance of early skin-to-skin contact    Comments: Keeps baby warm and secure, helps keep baby's blood sugar up and breathing steady, easier to bond and breastfeed, and helps calm baby.  - Rooming-in on a 24-hour basis    Comments: Easier to learn baby's feeding cues, easier to bond and get to know each other, and encourages milk production.   - Exclusive breastfeeding for the first 6 months    Comments: Builds a healthy milk supply and keeps it up, protects baby from sickness and disease, and breastmilk has everything your baby needs for the first 6 months.

## 2020-04-03 NOTE — Patient Instructions (Signed)
Common Medications Safe in Pregnancy  Acne:      Constipation:  Benzoyl Peroxide     Colace  Clindamycin      Dulcolax Suppository  Topica Erythromycin     Fibercon  Salicylic Acid      Metamucil         Miralax AVOID:        Senakot   Accutane    Cough:  Retin-A       Cough Drops  Tetracycline      Phenergan w/ Codeine if Rx  Minocycline      Robitussin (Plain & DM)  Antibiotics:     Crabs/Lice:  Ceclor       RID  Cephalosporins    AVOID:  E-Mycins      Kwell  Keflex  Macrobid/Macrodantin   Diarrhea:  Penicillin      Kao-Pectate  Zithromax      Imodium AD         PUSH FLUIDS AVOID:       Cipro     Fever:  Tetracycline      Tylenol (Regular or Extra  Minocycline       Strength)  Levaquin      Extra Strength-Do not          Exceed 8 tabs/24 hrs Caffeine:        <200mg/day (equiv. To 1 cup of coffee or  approx. 3 12 oz sodas)         Gas: Cold/Hayfever:       Gas-X  Benadryl      Mylicon  Claritin       Phazyme  **Claritin-D        Chlor-Trimeton    Headaches:  Dimetapp      ASA-Free Excedrin  Drixoral-Non-Drowsy     Cold Compress  Mucinex (Guaifenasin)     Tylenol (Regular or Extra  Sudafed/Sudafed-12 Hour     Strength)  **Sudafed PE Pseudoephedrine   Tylenol Cold & Sinus     Vicks Vapor Rub  Zyrtec  **AVOID if Problems With Blood Pressure         Heartburn: Avoid lying down for at least 1 hour after meals  Aciphex      Maalox     Rash:  Milk of Magnesia     Benadryl    Mylanta       1% Hydrocortisone Cream  Pepcid  Pepcid Complete   Sleep Aids:  Prevacid      Ambien   Prilosec       Benadryl  Rolaids       Chamomile Tea  Tums (Limit 4/day)     Unisom         Tylenol PM         Warm milk-add vanilla or  Hemorrhoids:       Sugar for taste  Anusol/Anusol H.C.  (RX: Analapram 2.5%)  Sugar Substitutes:  Hydrocortisone OTC     Ok in moderation  Preparation H      Tucks        Vaseline lotion applied to tissue with  wiping    Herpes:     Throat:  Acyclovir      Oragel  Famvir  Valtrex     Vaccines:         Flu Shot Leg Cramps:       *Gardasil  Benadryl      Hepatitis A         Hepatitis B Nasal Spray:         Pneumovax  Saline Nasal Spray     Polio Booster         Tetanus Nausea:       Tuberculosis test or PPD  Vitamin B6 25 mg TID   AVOID:    Dramamine      *Gardasil  Emetrol       Live Poliovirus  Ginger Root 250 mg QID    MMR (measles, mumps &  High Complex Carbs @ Bedtime    rebella)  Sea Bands-Accupressure    Varicella (Chickenpox)  Unisom 1/2 tab TID     *No known complications           If received before Pain:         Known pregnancy;   Darvocet       Resume series after  Lortab        Delivery  Percocet    Yeast:   Tramadol      Femstat  Tylenol 3      Gyne-lotrimin  Ultram       Monistat  Vicodin           MISC:         All Sunscreens           Hair Coloring/highlights          Insect Repellant's          (Including DEET)         Mystic Tans Breastfeeding  Choosing to breastfeed is one of the best decisions you can make for yourself and your baby. A change in hormones during pregnancy causes your breasts to make breast milk in your milk-producing glands. Hormones prevent breast milk from being released before your baby is born. They also prompt milk flow after birth. Once breastfeeding has begun, thoughts of your baby, as well as his or her sucking or crying, can stimulate the release of milk from your milk-producing glands. Benefits of breastfeeding Research shows that breastfeeding offers many health benefits for infants and mothers. It also offers a cost-free and convenient way to feed your baby. For your baby  Your first milk (colostrum) helps your baby's digestive system to function better.  Special cells in your milk (antibodies) help your baby to fight off infections.  Breastfed babies are less likely to develop asthma, allergies, obesity, or type 2 diabetes. They  are also at lower risk for sudden infant death syndrome (SIDS).  Nutrients in breast milk are better able to meet your baby's needs compared to infant formula.  Breast milk improves your baby's brain development. For you  Breastfeeding helps to create a very special bond between you and your baby.  Breastfeeding is convenient. Breast milk costs nothing and is always available at the correct temperature.  Breastfeeding helps to burn calories. It helps you to lose the weight that you gained during pregnancy.  Breastfeeding makes your uterus return faster to its size before pregnancy. It also slows bleeding (lochia) after you give birth.  Breastfeeding helps to lower your risk of developing type 2 diabetes, osteoporosis, rheumatoid arthritis, cardiovascular disease, and breast, ovarian, uterine, and endometrial cancer later in life. Breastfeeding basics Starting breastfeeding  Find a comfortable place to sit or lie down, with your neck and back well-supported.  Place a pillow or a rolled-up blanket under your baby to bring him or her to the level of your breast (if you are seated). Nursing pillows are specially designed to help support your arms and your baby while you   breastfeed.  Make sure that your baby's tummy (abdomen) is facing your abdomen.  Gently massage your breast. With your fingertips, massage from the outer edges of your breast inward toward the nipple. This encourages milk flow. If your milk flows slowly, you may need to continue this action during the feeding.  Support your breast with 4 fingers underneath and your thumb above your nipple (make the letter "C" with your hand). Make sure your fingers are well away from your nipple and your baby's mouth.  Stroke your baby's lips gently with your finger or nipple.  When your baby's mouth is open wide enough, quickly bring your baby to your breast, placing your entire nipple and as much of the areola as possible into your baby's  mouth. The areola is the colored area around your nipple. ? More areola should be visible above your baby's upper lip than below the lower lip. ? Your baby's lips should be opened and extended outward (flanged) to ensure an adequate, comfortable latch. ? Your baby's tongue should be between his or her lower gum and your breast.  Make sure that your baby's mouth is correctly positioned around your nipple (latched). Your baby's lips should create a seal on your breast and be turned out (everted).  It is common for your baby to suck about 2-3 minutes in order to start the flow of breast milk. Latching Teaching your baby how to latch onto your breast properly is very important. An improper latch can cause nipple pain, decreased milk supply, and poor weight gain in your baby. Also, if your baby is not latched onto your nipple properly, he or she may swallow some air during feeding. This can make your baby fussy. Burping your baby when you switch breasts during the feeding can help to get rid of the air. However, teaching your baby to latch on properly is still the best way to prevent fussiness from swallowing air while breastfeeding. Signs that your baby has successfully latched onto your nipple  Silent tugging or silent sucking, without causing you pain. Infant's lips should be extended outward (flanged).  Swallowing heard between every 3-4 sucks once your milk has started to flow (after your let-down milk reflex occurs).  Muscle movement above and in front of his or her ears while sucking. Signs that your baby has not successfully latched onto your nipple  Sucking sounds or smacking sounds from your baby while breastfeeding.  Nipple pain. If you think your baby has not latched on correctly, slip your finger into the corner of your baby's mouth to break the suction and place it between your baby's gums. Attempt to start breastfeeding again. Signs of successful breastfeeding Signs from your  baby  Your baby will gradually decrease the number of sucks or will completely stop sucking.  Your baby will fall asleep.  Your baby's body will relax.  Your baby will retain a small amount of milk in his or her mouth.  Your baby will let go of your breast by himself or herself. Signs from you  Breasts that have increased in firmness, weight, and size 1-3 hours after feeding.  Breasts that are softer immediately after breastfeeding.  Increased milk volume, as well as a change in milk consistency and color by the fifth day of breastfeeding.  Nipples that are not sore, cracked, or bleeding. Signs that your baby is getting enough milk  Wetting at least 1-2 diapers during the first 24 hours after birth.  Wetting at least 5-6   diapers every 24 hours for the first week after birth. The urine should be clear or pale yellow by the age of 5 days.  Wetting 6-8 diapers every 24 hours as your baby continues to grow and develop.  At least 3 stools in a 24-hour period by the age of 5 days. The stool should be soft and yellow.  At least 3 stools in a 24-hour period by the age of 7 days. The stool should be seedy and yellow.  No loss of weight greater than 10% of birth weight during the first 3 days of life.  Average weight gain of 4-7 oz (113-198 g) per week after the age of 4 days.  Consistent daily weight gain by the age of 5 days, without weight loss after the age of 2 weeks. After a feeding, your baby may spit up a small amount of milk. This is normal. Breastfeeding frequency and duration Frequent feeding will help you make more milk and can prevent sore nipples and extremely full breasts (breast engorgement). Breastfeed when you feel the need to reduce the fullness of your breasts or when your baby shows signs of hunger. This is called "breastfeeding on demand." Signs that your baby is hungry include:  Increased alertness, activity, or restlessness.  Movement of the head from side to  side.  Opening of the mouth when the corner of the mouth or cheek is stroked (rooting).  Increased sucking sounds, smacking lips, cooing, sighing, or squeaking.  Hand-to-mouth movements and sucking on fingers or hands.  Fussing or crying. Avoid introducing a pacifier to your baby in the first 4-6 weeks after your baby is born. After this time, you may choose to use a pacifier. Research has shown that pacifier use during the first year of a baby's life decreases the risk of sudden infant death syndrome (SIDS). Allow your baby to feed on each breast as long as he or she wants. When your baby unlatches or falls asleep while feeding from the first breast, offer the second breast. Because newborns are often sleepy in the first few weeks of life, you may need to awaken your baby to get him or her to feed. Breastfeeding times will vary from baby to baby. However, the following rules can serve as a guide to help you make sure that your baby is properly fed:  Newborns (babies 4 weeks of age or younger) may breastfeed every 1-3 hours.  Newborns should not go without breastfeeding for longer than 3 hours during the day or 5 hours during the night.  You should breastfeed your baby a minimum of 8 times in a 24-hour period. Breast milk pumping     Pumping and storing breast milk allows you to make sure that your baby is exclusively fed your breast milk, even at times when you are unable to breastfeed. This is especially important if you go back to work while you are still breastfeeding, or if you are not able to be present during feedings. Your lactation consultant can help you find a method of pumping that works best for you and give you guidelines about how long it is safe to store breast milk. Caring for your breasts while you breastfeed Nipples can become dry, cracked, and sore while breastfeeding. The following recommendations can help keep your breasts moisturized and healthy:  Avoid using soap on  your nipples.  Wear a supportive bra designed especially for nursing. Avoid wearing underwire-style bras or extremely tight bras (sports bras).  Air-dry your   nipples for 3-4 minutes after each feeding.  Use only cotton bra pads to absorb leaked breast milk. Leaking of breast milk between feedings is normal.  Use lanolin on your nipples after breastfeeding. Lanolin helps to maintain your skin's normal moisture barrier. Pure lanolin is not harmful (not toxic) to your baby. You may also hand express a few drops of breast milk and gently massage that milk into your nipples and allow the milk to air-dry. In the first few weeks after giving birth, some women experience breast engorgement. Engorgement can make your breasts feel heavy, warm, and tender to the touch. Engorgement peaks within 3-5 days after you give birth. The following recommendations can help to ease engorgement:  Completely empty your breasts while breastfeeding or pumping. You may want to start by applying warm, moist heat (in the shower or with warm, water-soaked hand towels) just before feeding or pumping. This increases circulation and helps the milk flow. If your baby does not completely empty your breasts while breastfeeding, pump any extra milk after he or she is finished.  Apply ice packs to your breasts immediately after breastfeeding or pumping, unless this is too uncomfortable for you. To do this: ? Put ice in a plastic bag. ? Place a towel between your skin and the bag. ? Leave the ice on for 20 minutes, 2-3 times a day.  Make sure that your baby is latched on and positioned properly while breastfeeding. If engorgement persists after 48 hours of following these recommendations, contact your health care provider or a lactation consultant. Overall health care recommendations while breastfeeding  Eat 3 healthy meals and 3 snacks every day. Well-nourished mothers who are breastfeeding need an additional 450-500 calories a day.  You can meet this requirement by increasing the amount of a balanced diet that you eat.  Drink enough water to keep your urine pale yellow or clear.  Rest often, relax, and continue to take your prenatal vitamins to prevent fatigue, stress, and low vitamin and mineral levels in your body (nutrient deficiencies).  Do not use any products that contain nicotine or tobacco, such as cigarettes and e-cigarettes. Your baby may be harmed by chemicals from cigarettes that pass into breast milk and exposure to secondhand smoke. If you need help quitting, ask your health care provider.  Avoid alcohol.  Do not use illegal drugs or marijuana.  Talk with your health care provider before taking any medicines. These include over-the-counter and prescription medicines as well as vitamins and herbal supplements. Some medicines that may be harmful to your baby can pass through breast milk.  It is possible to become pregnant while breastfeeding. If birth control is desired, ask your health care provider about options that will be safe while breastfeeding your baby. Where to find more information: [employer]: www.llli.org Contact a health care provider if:  You feel like you want to stop breastfeeding or have become frustrated with breastfeeding.  Your nipples are cracked or bleeding.  Your breasts are red, tender, or warm.  You have: ? Painful breasts or nipples. ? A swollen area on either breast. ? A fever or chills. ? Nausea or vomiting. ? Drainage other than breast milk from your nipples.  Your breasts do not become full before feedings by the fifth day after you give birth.  You feel sad and depressed.  Your baby is: ? Too sleepy to eat well. ? Having trouble sleeping. ? More than 1 week old and wetting fewer than 6   diapers in a 24-hour period. ? Not gaining weight by 5 days of age.  Your baby has fewer than 3 stools in a 24-hour period.  Your baby's skin or the white  parts of his or her eyes become yellow. Get help right away if:  Your baby is overly tired (lethargic) and does not want to wake up and feed.  Your baby develops an unexplained fever. Summary  Breastfeeding offers many health benefits for infant and mothers.  Try to breastfeed your infant when he or she shows early signs of hunger.  Gently tickle or stroke your baby's lips with your finger or nipple to allow the baby to open his or her mouth. Bring the baby to your breast. Make sure that much of the areola is in your baby's mouth. Offer one side and burp the baby before you offer the other side.  Talk with your health care provider or lactation consultant if you have questions or you face problems as you breastfeed. This information is not intended to replace advice given to you by your health care provider. Make sure you discuss any questions you have with your health care provider. Document Revised: 08/05/2017 Document Reviewed: 06/12/2016 [employer] Patient Education  2020 [employer]  

## 2020-05-01 ENCOUNTER — Ambulatory Visit (INDEPENDENT_AMBULATORY_CARE_PROVIDER_SITE_OTHER): Payer: Medicaid Other | Admitting: Obstetrics and Gynecology

## 2020-05-01 ENCOUNTER — Other Ambulatory Visit: Payer: Self-pay

## 2020-05-01 ENCOUNTER — Other Ambulatory Visit: Payer: Medicaid Other

## 2020-05-01 ENCOUNTER — Encounter: Payer: Self-pay | Admitting: Obstetrics and Gynecology

## 2020-05-01 VITALS — BP 101/66 | HR 80 | Wt 172.7 lb

## 2020-05-01 DIAGNOSIS — Z23 Encounter for immunization: Secondary | ICD-10-CM | POA: Diagnosis not present

## 2020-05-01 DIAGNOSIS — Z3A28 28 weeks gestation of pregnancy: Secondary | ICD-10-CM

## 2020-05-01 DIAGNOSIS — R319 Hematuria, unspecified: Secondary | ICD-10-CM

## 2020-05-01 DIAGNOSIS — Z3483 Encounter for supervision of other normal pregnancy, third trimester: Secondary | ICD-10-CM | POA: Diagnosis not present

## 2020-05-01 DIAGNOSIS — O0992 Supervision of high risk pregnancy, unspecified, second trimester: Secondary | ICD-10-CM

## 2020-05-01 LAB — POCT URINALYSIS DIPSTICK OB
Bilirubin, UA: NEGATIVE
Glucose, UA: NEGATIVE
Ketones, UA: NEGATIVE
Leukocytes, UA: NEGATIVE
Nitrite, UA: NEGATIVE
POC,PROTEIN,UA: NEGATIVE
Spec Grav, UA: 1.01 (ref 1.010–1.025)
Urobilinogen, UA: 0.2 E.U./dL
pH, UA: 8 (ref 5.0–8.0)

## 2020-05-01 NOTE — Progress Notes (Signed)
ROB: 1 hour GCT today.  Patient not having any issues.  Reports daily fetal movement.  Taking vitamins and aspirin as directed.

## 2020-05-01 NOTE — Progress Notes (Signed)
Tiffany Bowen sent for C&S - sma amount hematuria - pt asymptomatic

## 2020-05-01 NOTE — Progress Notes (Signed)
tdap administered. Pt tolerated well. No adverse reactions.

## 2020-05-02 LAB — RPR: RPR Ser Ql: NONREACTIVE

## 2020-05-02 LAB — GLUCOSE, 1 HOUR GESTATIONAL: Gestational Diabetes Screen: 125 mg/dL (ref 65–139)

## 2020-05-02 LAB — CBC
Hematocrit: 30.5 % — ABNORMAL LOW (ref 34.0–46.6)
Hemoglobin: 10 g/dL — ABNORMAL LOW (ref 11.1–15.9)
MCH: 29.1 pg (ref 26.6–33.0)
MCHC: 32.8 g/dL (ref 31.5–35.7)
MCV: 89 fL (ref 79–97)
Platelets: 169 10*3/uL (ref 150–450)
RBC: 3.44 x10E6/uL — ABNORMAL LOW (ref 3.77–5.28)
RDW: 11.9 % (ref 11.7–15.4)
WBC: 7 10*3/uL (ref 3.4–10.8)

## 2020-05-04 LAB — URINE CULTURE

## 2020-05-15 ENCOUNTER — Ambulatory Visit (INDEPENDENT_AMBULATORY_CARE_PROVIDER_SITE_OTHER): Payer: Medicaid Other | Admitting: Obstetrics and Gynecology

## 2020-05-15 ENCOUNTER — Other Ambulatory Visit: Payer: Self-pay

## 2020-05-15 ENCOUNTER — Encounter: Payer: Self-pay | Admitting: Obstetrics and Gynecology

## 2020-05-15 VITALS — BP 115/76 | HR 91 | Wt 177.4 lb

## 2020-05-15 DIAGNOSIS — Z3483 Encounter for supervision of other normal pregnancy, third trimester: Secondary | ICD-10-CM

## 2020-05-15 DIAGNOSIS — O99013 Anemia complicating pregnancy, third trimester: Secondary | ICD-10-CM | POA: Insufficient documentation

## 2020-05-15 DIAGNOSIS — O09293 Supervision of pregnancy with other poor reproductive or obstetric history, third trimester: Secondary | ICD-10-CM

## 2020-05-15 DIAGNOSIS — Z3A30 30 weeks gestation of pregnancy: Secondary | ICD-10-CM

## 2020-05-15 LAB — POCT URINALYSIS DIPSTICK OB
Bilirubin, UA: NEGATIVE
Blood, UA: NEGATIVE
Glucose, UA: NEGATIVE
Ketones, UA: NEGATIVE
Nitrite, UA: NEGATIVE
POC,PROTEIN,UA: NEGATIVE
Spec Grav, UA: 1.01 (ref 1.010–1.025)
Urobilinogen, UA: 0.2 E.U./dL
pH, UA: 7 (ref 5.0–8.0)

## 2020-05-15 NOTE — Patient Instructions (Signed)
Third Trimester of Pregnancy The third trimester is from week 28 through week 40 (months 7 through 9). The third trimester is a time when the unborn baby (fetus) is growing rapidly. At the end of the ninth month, the fetus is about 20 inches in length and weighs 6-10 pounds. Body changes during your third trimester Your body will continue to go through many changes during pregnancy. The changes vary from woman to woman. During the third trimester:  Your weight will continue to increase. You can expect to gain 25-35 pounds (11-16 kg) by the end of the pregnancy.  You may begin to get stretch marks on your hips, abdomen, and breasts.  You may urinate more often because the fetus is moving lower into your pelvis and pressing on your bladder.  You may develop or continue to have heartburn. This is caused by increased hormones that slow down muscles in the digestive tract.  You may develop or continue to have constipation because increased hormones slow digestion and cause the muscles that push waste through your intestines to relax.  You may develop hemorrhoids. These are swollen veins (varicose veins) in the rectum that can itch or be painful.  You may develop swollen, bulging veins (varicose veins) in your legs.  You may have increased body aches in the pelvis, back, or thighs. This is due to weight gain and increased hormones that are relaxing your joints.  You may have changes in your hair. These can include thickening of your hair, rapid growth, and changes in texture. Some women also have hair loss during or after pregnancy, or hair that feels dry or thin. Your hair will most likely return to normal after your baby is born.  Your breasts will continue to grow and they will continue to become tender. A yellow fluid (colostrum) may leak from your breasts. This is the first milk you are producing for your baby.  Your belly button may stick out.  You may notice more swelling in your hands,  face, or ankles.  You may have increased tingling or numbness in your hands, arms, and legs. The skin on your belly may also feel numb.  You may feel short of breath because of your expanding uterus.  You may have more problems sleeping. This can be caused by the size of your belly, increased need to urinate, and an increase in your body's metabolism.  You may notice the fetus "dropping," or moving lower in your abdomen (lightening).  You may have increased vaginal discharge.  You may notice your joints feel loose and you may have pain around your pelvic bone. What to expect at prenatal visits You will have prenatal exams every 2 weeks until week 36. Then you will have weekly prenatal exams. During a routine prenatal visit:  You will be weighed to make sure you and the baby are growing normally.  Your blood pressure will be taken.  Your abdomen will be measured to track your baby's growth.  The fetal heartbeat will be listened to.  Any test results from the previous visit will be discussed.  You may have a cervical check near your due date to see if your cervix has softened or thinned (effaced).  You will be tested for Group B streptococcus. This happens between 35 and 37 weeks. Your health care provider may ask you:  What your birth plan is.  How you are feeling.  If you are feeling the baby move.  If you have had any abnormal  symptoms, such as leaking fluid, bleeding, severe headaches, or abdominal cramping.  If you are using any tobacco products, including cigarettes, chewing tobacco, and electronic cigarettes.  If you have any questions. Other tests or screenings that may be performed during your third trimester include:  Blood tests that check for low iron levels (anemia).  Fetal testing to check the health, activity level, and growth of the fetus. Testing is done if you have certain medical conditions or if there are problems during the pregnancy.  Nonstress test  (NST). This test checks the health of your baby to make sure there are no signs of problems, such as the baby not getting enough oxygen. During this test, a belt is placed around your belly. The baby is made to move, and its heart rate is monitored during movement. What is false labor? False labor is a condition in which you feel small, irregular tightenings of the muscles in the womb (contractions) that usually go away with rest, changing position, or drinking water. These are called Braxton Hicks contractions. Contractions may last for hours, days, or even weeks before true labor sets in. If contractions come at regular intervals, become more frequent, increase in intensity, or become painful, you should see your health care provider. What are the signs of labor?  Abdominal cramps.  Regular contractions that start at 10 minutes apart and become stronger and more frequent with time.  Contractions that start on the top of the uterus and spread down to the lower abdomen and back.  Increased pelvic pressure and dull back pain.  A watery or bloody mucus discharge that comes from the vagina.  Leaking of amniotic fluid. This is also known as your "water breaking." It could be a slow trickle or a gush. Let your health care provider know if it has a color or strange odor. If you have any of these signs, call your health care provider right away, even if it is before your due date. Follow these instructions at home: Medicines  Follow your health care provider's instructions regarding medicine use. Specific medicines may be either safe or unsafe to take during pregnancy.  Take a prenatal vitamin that contains at least 600 micrograms (mcg) of folic acid.  If you develop constipation, try taking a stool softener if your health care provider approves. Eating and drinking   Eat a balanced diet that includes fresh fruits and vegetables, whole grains, good sources of protein such as meat, eggs, or tofu,  and low-fat dairy. Your health care provider will help you determine the amount of weight gain that is right for you.  Avoid raw meat and uncooked cheese. These carry germs that can cause birth defects in the baby.  If you have low calcium intake from food, talk to your health care provider about whether you should take a daily calcium supplement.  Eat four or five small meals rather than three large meals a day.  Limit foods that are high in fat and processed sugars, such as fried and sweet foods.  To prevent constipation: ? Drink enough fluid to keep your urine clear or pale yellow. ? Eat foods that are high in fiber, such as fresh fruits and vegetables, whole grains, and beans. Activity  Exercise only as directed by your health care provider. Most women can continue their usual exercise routine during pregnancy. Try to exercise for 30 minutes at least 5 days a week. Stop exercising if you experience uterine contractions.  Avoid heavy lifting.  Do  not exercise in extreme heat or humidity, or at high altitudes.  Wear low-heel, comfortable shoes.  Practice good posture.  You may continue to have sex unless your health care provider tells you otherwise. Relieving pain and discomfort  Take frequent breaks and rest with your legs elevated if you have leg cramps or low back pain.  Take warm sitz baths to soothe any pain or discomfort caused by hemorrhoids. Use hemorrhoid cream if your health care provider approves.  Wear a good support bra to prevent discomfort from breast tenderness.  If you develop varicose veins: ? Wear support pantyhose or compression stockings as told by your healthcare provider. ? Elevate your feet for 15 minutes, 3-4 times a day. Prenatal care  Write down your questions. Take them to your prenatal visits.  Keep all your prenatal visits as told by your health care provider. This is important. Safety  Wear your seat belt at all times when driving.  Make  a list of emergency phone numbers, including numbers for family, friends, the hospital, and police and fire departments. General instructions  Avoid cat litter boxes and soil used by cats. These carry germs that can cause birth defects in the baby. If you have a cat, ask someone to clean the litter box for you.  Do not travel far distances unless it is absolutely necessary and only with the approval of your health care provider.  Do not use hot tubs, steam rooms, or saunas.  Do not drink alcohol.  Do not use any products that contain nicotine or tobacco, such as cigarettes and e-cigarettes. If you need help quitting, ask your health care provider.  Do not use any medicinal herbs or unprescribed drugs. These chemicals affect the formation and growth of the baby.  Do not douche or use tampons or scented sanitary pads.  Do not cross your legs for long periods of time.  To prepare for the arrival of your baby: ? Take prenatal classes to understand, practice, and ask questions about labor and delivery. ? Make a trial run to the hospital. ? Visit the hospital and tour the maternity area. ? Arrange for maternity or paternity leave through employers. ? Arrange for family and friends to take care of pets while you are in the hospital. ? Purchase a rear-facing car seat and make sure you know how to install it in your car. ? Pack your hospital bag. ? Prepare the babys nursery. Make sure to remove all pillows and stuffed animals from the baby's crib to prevent suffocation.  Visit your dentist if you have not gone during your pregnancy. Use a soft toothbrush to brush your teeth and be gentle when you floss. Contact a health care provider if:  You are unsure if you are in labor or if your water has broken.  You become dizzy.  You have mild pelvic cramps, pelvic pressure, or nagging pain in your abdominal area.  You have lower back pain.  You have persistent nausea, vomiting, or  diarrhea.  You have an unusual or bad smelling vaginal discharge.  You have pain when you urinate. Get help right away if:  Your water breaks before 37 weeks.  You have regular contractions less than 5 minutes apart before 37 weeks.  You have a fever.  You are leaking fluid from your vagina.  You have spotting or bleeding from your vagina.  You have severe abdominal pain or cramping.  You have rapid weight loss or weight gain.  You have  shortness of breath with chest pain.  You notice sudden or extreme swelling of your face, hands, ankles, feet, or legs.  Your baby makes fewer than 10 movements in 2 hours.  You have severe headaches that do not go away when you take medicine.  You have vision changes. Summary  The third trimester is from week 28 through week 40, months 7 through 9. The third trimester is a time when the unborn baby (fetus) is growing rapidly.  During the third trimester, your discomfort may increase as you and your baby continue to gain weight. You may have abdominal, leg, and back pain, sleeping problems, and an increased need to urinate.  During the third trimester your breasts will keep growing and they will continue to become tender. A yellow fluid (colostrum) may leak from your breasts. This is the first milk you are producing for your baby.  False labor is a condition in which you feel small, irregular tightenings of the muscles in the womb (contractions) that eventually go away. These are called Braxton Hicks contractions. Contractions may last for hours, days, or even weeks before true labor sets in.  Signs of labor can include: abdominal cramps; regular contractions that start at 10 minutes apart and become stronger and more frequent with time; watery or bloody mucus discharge that comes from the vagina; increased pelvic pressure and dull back pain; and leaking of amniotic fluid. This information is not intended to replace advice given to you by your  health care provider. Make sure you discuss any questions you have with your health care provider. Document Revised: 09/01/2018 Document Reviewed: 06/16/2016 Elsevier Patient Education  2020 [employer].    Breastfeeding Tips for a Good Latch Breastfeeding can be challenging, especially during the first few weeks after childbirth. It is normal to have some problems when you start to breastfeed your new baby, even if you have breastfed before. Latching is an important part of having a good breastfeeding experience. This refers to how your baby's mouth attaches to your nipple to breastfeed. Your baby may have trouble latching due to:  Poor positioning.  Nipple confusion. This can occur if you introduce a bottle or pacifier too early.  Abnormalities in your baby's mouth, tongue, or lips. This includes conditions like tongue-tie or cleft lip.  The shape of your nipples, such as nipples that are flat or turned in (inverted).  Your baby being born early (prematurely). Small babies often have a weak suck. Work with a breastfeeding specialist (Advertising copywriter) to find positions and strategies that will help make sure your baby has a good latch. How does this affect me? A poor latch may cause you to have problems such as:  Cracked or sore nipples.  Breasts becoming overfilled with milk (engorgement).  Plugged milk ducts.  Low milk supply.  Breast inflammation or infection. How does this affect my baby? A poor latch may cause your baby to not be able to feed effectively. As a result, he or she may have trouble gaining weight. Follow these instructions at home: How to position your baby  Find a comfortable place to sit or lie down, with your neck and back well supported.  If you are seated, place a pillow or rolled-up blanket under your baby to bring him or her to the level of your breast.  Make sure that your baby's abdomen is facing your abdomen.  Try different positions to  find one that works best for you and your baby. How to  help your baby latch  To begin each breastfeeding session, gently massage your breast. With your fingertips, massage from your chest wall toward your nipple in a circular motion. This encourages milk flow. If your milk flows slowly, you may need to continue this action during feeding. Position your breast for an ideal latch. Support your breast with four fingers underneath and your thumb above your nipple. Keep your fingers away from your nipple and your baby's mouth. To help your baby latch, follow these steps: 1. Stroke your baby's lips gently with your finger or nipple. 2. When your baby's mouth is open wide enough, quickly bring your baby to your breast and place your entire nipple, and as much of the areolaas possible, into your baby's mouth. The areola is the colored area around your nipple. 3. Your baby's tongue should be between his or her lower gum and your breast. 4. More areola should be visible above your baby's upper lip than below the lower lip. 5. When your baby starts sucking, you will feel a gentle pull on your nipple, but you should not feel pain. Be patient. It is common for a baby to suck for about 2-3 minutes to start the flow of breast milk. 6. Make sure that your baby's mouth is correctly positioned around your nipple. Your baby's lips should create a seal on your breast and be turned outward (everted).  General instructions  Look for the following signs that your baby has successfully latched on to your nipple: ? The baby is quietly tugging or quietly sucking without causing you pain. ? You hear the baby swallow after every 3 or 4 sucks. ? You see muscle movement above and in front of the baby's ears while he or she is sucking.  Be aware of these signs that your baby has not successfully latched on to your nipple: ? The baby makes sucking sounds or smacking sounds while nursing. ? You have nipple pain.  If your  baby is not latched well, insert your little finger between your baby's gums and your nipple to break the seal. Then try to help your baby latch again. Contact a health care provider if:  You have cracking or soreness in your nipples that lasts longer than 1 week.  You have nipple pain. Nipple cracking and soreness are common during the first week after birth, but nipple pain is never normal.  You have breast engorgement that does not improve after 48-72 hours.  You have a plugged milk duct and a fever.  You follow suggestions for a good latch but you continue to have problems or concerns.  You have pus-like discharge coming from your breast.  Your baby is not gaining weight or loses weight. Summary  Many common breastfeeding challenges are caused by poor latching. Latching refers to how your baby's mouth attaches to your nipple during breastfeeding.  If problems continue, seek help from a lactation consultant in your community. He or she can assess you and your baby for any latching problems. The lactation consultant can work with you to develop a plan to overcome any breastfeeding challenges. This information is not intended to replace advice given to you by your health care provider. Make sure you discuss any questions you have with your health care provider. Document Revised: 08/31/2018 Document Reviewed: 12/23/2016 Elsevier Patient Education  2020 [employer].

## 2020-05-15 NOTE — Progress Notes (Signed)
ROB-Pt present for routine prenatal care. Pt stated that she was doing well no problems.  

## 2020-05-15 NOTE — Progress Notes (Signed)
ROB: Patient doing well, no complaints.  Further discussed breastfeeding.  Discussed pain management in labor, unsure of desires at this time but may consider epidural again. Normal 28 week labs except mild anemia noted.

## 2020-05-25 NOTE — L&D Delivery Note (Signed)
       Delivery Note   Tiffany Bowen is a 30 y.o. [id number] at [redacted]w[redacted]d Estimated Date of Delivery: 07/21/20  PRE-OPERATIVE DIAGNOSIS:  1) [redacted]w[redacted]d pregnancy.  2) Labor  POST-OPERATIVE DIAGNOSIS:  1) [redacted]w[redacted]d pregnancy s/p Vaginal, Spontaneous  2) viable female infant  Delivery Type: Vaginal, Spontaneous    Delivery Anesthesia: Epidural   Labor Complications:  None    ESTIMATED BLOOD LOSS: 25  ml    FINDINGS:   1) female infant, Apgar scores of    at 1 minute and    at 5 minutes and a birthweight of   ounces.    2) Nuchal cord: No  SPECIMENS:   PLACENTA:   Appearance: Intact    Removal: Spontaneous      Disposition:    DISPOSITION:  Infant to left in stable condition in the delivery room, with L&D personnel and mother,  NARRATIVE SUMMARY: Labor course:  Tiffany Bowen is a [id number] at [redacted]w[redacted]d who presented for labor management.  She progressed well in labor with pitocin.  She received the appropriate anesthesia and proceeded to complete dilation. She evidenced good maternal expulsive effort during the second stage. She went on to deliver a viable infant. The placenta delivered without problems and was noted to be complete. A perineal and vaginal examination was performed. Episiotomy/Lacerations: None   Elonda Husky, M.D. 07/18/2020 7:35 PM

## 2020-05-29 ENCOUNTER — Encounter: Payer: Medicaid Other | Admitting: Obstetrics and Gynecology

## 2020-05-30 ENCOUNTER — Encounter: Payer: Self-pay | Admitting: Obstetrics and Gynecology

## 2020-05-31 ENCOUNTER — Encounter: Payer: Self-pay | Admitting: Obstetrics and Gynecology

## 2020-05-31 ENCOUNTER — Ambulatory Visit (INDEPENDENT_AMBULATORY_CARE_PROVIDER_SITE_OTHER): Payer: Medicaid Other | Admitting: Obstetrics and Gynecology

## 2020-05-31 ENCOUNTER — Other Ambulatory Visit: Payer: Self-pay

## 2020-05-31 VITALS — BP 114/65 | HR 81 | Wt 185.0 lb

## 2020-05-31 DIAGNOSIS — Z3483 Encounter for supervision of other normal pregnancy, third trimester: Secondary | ICD-10-CM

## 2020-05-31 DIAGNOSIS — Z3A32 32 weeks gestation of pregnancy: Secondary | ICD-10-CM

## 2020-05-31 LAB — POCT URINALYSIS DIPSTICK OB
Bilirubin, UA: NEGATIVE
Blood, UA: NEGATIVE
Glucose, UA: NEGATIVE
Ketones, UA: NEGATIVE
Leukocytes, UA: NEGATIVE
Nitrite, UA: NEGATIVE
POC,PROTEIN,UA: NEGATIVE
Spec Grav, UA: 1.01 (ref 1.010–1.025)
Urobilinogen, UA: 0.2 E.U./dL
pH, UA: 7 (ref 5.0–8.0)

## 2020-05-31 NOTE — Patient Instructions (Signed)
Braxton Hicks Contractions °Contractions of the uterus can occur throughout pregnancy, but they are not always a sign that you are in labor. You may have practice contractions called Braxton Hicks contractions. These false labor contractions are sometimes confused with true labor. °What are Braxton Hicks contractions? °Braxton Hicks contractions are tightening movements that occur in the muscles of the uterus before labor. Unlike true labor contractions, these contractions do not result in opening (dilation) and thinning of the cervix. Toward the end of pregnancy (32-34 weeks), Braxton Hicks contractions can happen more often and may become stronger. These contractions are sometimes difficult to tell apart from true labor because they can be very uncomfortable. You should not feel embarrassed if you go to the hospital with false labor. °Sometimes, the only way to tell if you are in true labor is for your health care provider to look for changes in the cervix. The health care provider will do a physical exam and may monitor your contractions. If you are not in true labor, the exam should show that your cervix is not dilating and your water has not broken. °If there are no other health problems associated with your pregnancy, it is completely safe for you to be sent home with false labor. You may continue to have Braxton Hicks contractions until you go into true labor. °How to tell the difference between true labor and false labor °True labor °· Contractions last 30-70 seconds. °· Contractions become very regular. °· Discomfort is usually felt in the top of the uterus, and it spreads to the lower abdomen and low back. °· Contractions do not go away with walking. °· Contractions usually become more intense and increase in frequency. °· The cervix dilates and gets thinner. °False labor °· Contractions are usually shorter and not as strong as true labor contractions. °· Contractions are usually irregular. °· Contractions  are often felt in the front of the lower abdomen and in the groin. °· Contractions may go away when you walk around or change positions while lying down. °· Contractions get weaker and are shorter-lasting as time goes on. °· The cervix usually does not dilate or become thin. °Follow these instructions at home: ° °· Take over-the-counter and prescription medicines only as told by your health care provider. °· Keep up with your usual exercises and follow other instructions from your health care provider. °· Eat and drink lightly if you think you are going into labor. °· If Braxton Hicks contractions are making you uncomfortable: °? Change your position from lying down or resting to walking, or change from walking to resting. °? Sit and rest in a tub of warm water. °? Drink enough fluid to keep your urine pale yellow. Dehydration may cause these contractions. °? Do slow and deep breathing several times an hour. °· Keep all follow-up prenatal visits as told by your health care provider. This is important. °Contact a health care provider if: °· You have a fever. °· You have continuous pain in your abdomen. °Get help right away if: °· Your contractions become stronger, more regular, and closer together. °· You have fluid leaking or gushing from your vagina. °· You pass blood-tinged mucus (bloody show). °· You have bleeding from your vagina. °· You have low back pain that you never had before. °· You feel your baby’s head pushing down and causing pelvic pressure. °· Your baby is not moving inside you as much as it used to. °Summary °· Contractions that occur before labor are   called Braxton Hicks contractions, false labor, or practice contractions. °· Braxton Hicks contractions are usually shorter, weaker, farther apart, and less regular than true labor contractions. True labor contractions usually become progressively stronger and regular, and they become more frequent. °· Manage discomfort from Braxton Hicks contractions  by changing position, resting in a warm bath, drinking plenty of water, or practicing deep breathing. °This information is not intended to replace advice given to you by your health care provider. Make sure you discuss any questions you have with your health care provider. °Document Revised: 04/23/2017 Document Reviewed: 09/24/2016 °Elsevier Patient Education © 2020 [employer] Inc[employer] ° °

## 2020-05-31 NOTE — Progress Notes (Signed)
ROB: Patient feels tired today but otherwise well.  Has no complaints.  Reports daily fetal movement.  Excited to be beyond 32 weeks.

## 2020-06-13 ENCOUNTER — Ambulatory Visit (INDEPENDENT_AMBULATORY_CARE_PROVIDER_SITE_OTHER): Payer: Medicaid Other | Admitting: Obstetrics and Gynecology

## 2020-06-13 ENCOUNTER — Encounter: Payer: Self-pay | Admitting: Obstetrics and Gynecology

## 2020-06-13 ENCOUNTER — Other Ambulatory Visit: Payer: Self-pay

## 2020-06-13 VITALS — BP 110/65 | HR 76 | Wt 186.4 lb

## 2020-06-13 DIAGNOSIS — Z308 Encounter for other contraceptive management: Secondary | ICD-10-CM

## 2020-06-13 DIAGNOSIS — O0993 Supervision of high risk pregnancy, unspecified, third trimester: Secondary | ICD-10-CM

## 2020-06-13 DIAGNOSIS — O09293 Supervision of pregnancy with other poor reproductive or obstetric history, third trimester: Secondary | ICD-10-CM

## 2020-06-13 DIAGNOSIS — Z3A34 34 weeks gestation of pregnancy: Secondary | ICD-10-CM

## 2020-06-13 LAB — POCT URINALYSIS DIPSTICK OB
Bilirubin, UA: NEGATIVE
Glucose, UA: NEGATIVE
Ketones, UA: NEGATIVE
Nitrite, UA: NEGATIVE
POC,PROTEIN,UA: NEGATIVE
Spec Grav, UA: 1.01 (ref 1.010–1.025)
Urobilinogen, UA: 2 E.U./dL — AB
pH, UA: 7.5 (ref 5.0–8.0)

## 2020-06-13 NOTE — Progress Notes (Signed)
ROB: Doing well, no issues. Desires to discuss permanent sterilization for contraception. Signed Medicaid sterilization papers. RTC in 2 weeks. For 36 week cultures then.

## 2020-06-13 NOTE — Patient Instructions (Addendum)
Common Medications Safe in Pregnancy  Acne:      Constipation:  Benzoyl Peroxide     Colace  Clindamycin      Dulcolax Suppository  Topica Erythromycin     Fibercon  Salicylic Acid      Metamucil         Miralax AVOID:        Senakot   Accutane    Cough:  Retin-A       Cough Drops  Tetracycline      Phenergan w/ Codeine if Rx  Minocycline      Robitussin (Plain & DM)  Antibiotics:     Crabs/Lice:  Ceclor       RID  Cephalosporins    AVOID:  E-Mycins      Kwell  Keflex  Macrobid/Macrodantin   Diarrhea:  Penicillin      Kao-Pectate  Zithromax      Imodium AD         PUSH FLUIDS AVOID:       Cipro     Fever:  Tetracycline      Tylenol (Regular or Extra  Minocycline       Strength)  Levaquin      Extra Strength-Do not          Exceed 8 tabs/24 hrs Caffeine:        <200mg/day (equiv. To 1 cup of coffee or  approx. 3 12 oz sodas)         Gas: Cold/Hayfever:       Gas-X  Benadryl      Mylicon  Claritin       Phazyme  **Claritin-D        Chlor-Trimeton    Headaches:  Dimetapp      ASA-Free Excedrin  Drixoral-Non-Drowsy     Cold Compress  Mucinex (Guaifenasin)     Tylenol (Regular or Extra  Sudafed/Sudafed-12 Hour     Strength)  **Sudafed PE Pseudoephedrine   Tylenol Cold & Sinus     Vicks Vapor Rub  Zyrtec  **AVOID if Problems With Blood Pressure         Heartburn: Avoid lying down for at least 1 hour after meals  Aciphex      Maalox     Rash:  Milk of Magnesia     Benadryl    Mylanta       1% Hydrocortisone Cream  Pepcid  Pepcid Complete   Sleep Aids:  Prevacid      Ambien   Prilosec       Benadryl  Rolaids       Chamomile Tea  Tums (Limit 4/day)     Unisom         Tylenol PM         Warm milk-add vanilla or  Hemorrhoids:       Sugar for taste  Anusol/Anusol H.C.  (RX: Analapram 2.5%)  Sugar Substitutes:  Hydrocortisone OTC     Ok in moderation  Preparation H      Tucks        Vaseline lotion applied to tissue with  wiping    Herpes:     Throat:  Acyclovir      Oragel  Famvir  Valtrex     Vaccines:         Flu Shot Leg Cramps:       *Gardasil  Benadryl      Hepatitis A         Hepatitis B Nasal Spray:         Pneumovax  Saline Nasal Spray     Polio Booster         Tetanus Nausea:       Tuberculosis test or PPD  Vitamin B6 25 mg TID   AVOID:    Dramamine      *Gardasil  Emetrol       Live Poliovirus  Ginger Root 250 mg QID    MMR (measles, mumps &  High Complex Carbs @ Bedtime    rebella)  Sea Bands-Accupressure    Varicella (Chickenpox)  Unisom 1/2 tab TID     *No known complications           If received before Pain:         Known pregnancy;   Darvocet       Resume series after  Lortab        Delivery  Percocet    Yeast:   Tramadol      Femstat  Tylenol 3      Gyne-lotrimin  Ultram       Monistat  Vicodin           MISC:         All Sunscreens           Hair Coloring/highlights          Insect Repellant's          (Including DEET)         Mystic Tans Breastfeeding  Choosing to breastfeed is one of the best decisions you can make for yourself and your baby. A change in hormones during pregnancy causes your breasts to make breast milk in your milk-producing glands. Hormones prevent breast milk from being released before your baby is born. They also prompt milk flow after birth. Once breastfeeding has begun, thoughts of your baby, as well as his or her sucking or crying, can stimulate the release of milk from your milk-producing glands. Benefits of breastfeeding Research shows that breastfeeding offers many health benefits for infants and mothers. It also offers a cost-free and convenient way to feed your baby. For your baby  Your first milk (colostrum) helps your baby's digestive system to function better.  Special cells in your milk (antibodies) help your baby to fight off infections.  Breastfed babies are less likely to develop asthma, allergies, obesity, or type 2 diabetes. They  are also at lower risk for sudden infant death syndrome (SIDS).  Nutrients in breast milk are better able to meet your baby's needs compared to infant formula.  Breast milk improves your baby's brain development. For you  Breastfeeding helps to create a very special bond between you and your baby.  Breastfeeding is convenient. Breast milk costs nothing and is always available at the correct temperature.  Breastfeeding helps to burn calories. It helps you to lose the weight that you gained during pregnancy.  Breastfeeding makes your uterus return faster to its size before pregnancy. It also slows bleeding (lochia) after you give birth.  Breastfeeding helps to lower your risk of developing type 2 diabetes, osteoporosis, rheumatoid arthritis, cardiovascular disease, and breast, ovarian, uterine, and endometrial cancer later in life. Breastfeeding basics Starting breastfeeding  Find a comfortable place to sit or lie down, with your neck and back well-supported.  Place a pillow or a rolled-up blanket under your baby to bring him or her to the level of your breast (if you are seated). Nursing pillows are specially designed to help support your arms and your baby while you  breastfeed.  Make sure that your baby's tummy (abdomen) is facing your abdomen.  Gently massage your breast. With your fingertips, massage from the outer edges of your breast inward toward the nipple. This encourages milk flow. If your milk flows slowly, you may need to continue this action during the feeding.  Support your breast with 4 fingers underneath and your thumb above your nipple (make the letter "C" with your hand). Make sure your fingers are well away from your nipple and your baby's mouth.  Stroke your baby's lips gently with your finger or nipple.  When your baby's mouth is open wide enough, quickly bring your baby to your breast, placing your entire nipple and as much of the areola as possible into your baby's  mouth. The areola is the colored area around your nipple. ? More areola should be visible above your baby's upper lip than below the lower lip. ? Your baby's lips should be opened and extended outward (flanged) to ensure an adequate, comfortable latch. ? Your baby's tongue should be between his or her lower gum and your breast.  Make sure that your baby's mouth is correctly positioned around your nipple (latched). Your baby's lips should create a seal on your breast and be turned out (everted).  It is common for your baby to suck about 2-3 minutes in order to start the flow of breast milk. Latching Teaching your baby how to latch onto your breast properly is very important. An improper latch can cause nipple pain, decreased milk supply, and poor weight gain in your baby. Also, if your baby is not latched onto your nipple properly, he or she may swallow some air during feeding. This can make your baby fussy. Burping your baby when you switch breasts during the feeding can help to get rid of the air. However, teaching your baby to latch on properly is still the best way to prevent fussiness from swallowing air while breastfeeding. Signs that your baby has successfully latched onto your nipple  Silent tugging or silent sucking, without causing you pain. Infant's lips should be extended outward (flanged).  Swallowing heard between every 3-4 sucks once your milk has started to flow (after your let-down milk reflex occurs).  Muscle movement above and in front of his or her ears while sucking. Signs that your baby has not successfully latched onto your nipple  Sucking sounds or smacking sounds from your baby while breastfeeding.  Nipple pain. If you think your baby has not latched on correctly, slip your finger into the corner of your baby's mouth to break the suction and place it between your baby's gums. Attempt to start breastfeeding again. Signs of successful breastfeeding Signs from your  baby  Your baby will gradually decrease the number of sucks or will completely stop sucking.  Your baby will fall asleep.  Your baby's body will relax.  Your baby will retain a small amount of milk in his or her mouth.  Your baby will let go of your breast by himself or herself. Signs from you  Breasts that have increased in firmness, weight, and size 1-3 hours after feeding.  Breasts that are softer immediately after breastfeeding.  Increased milk volume, as well as a change in milk consistency and color by the fifth day of breastfeeding.  Nipples that are not sore, cracked, or bleeding. Signs that your baby is getting enough milk  Wetting at least 1-2 diapers during the first 24 hours after birth.  Wetting at least 5-6  diapers every 24 hours for the first week after birth. The urine should be clear or pale yellow by the age of 5 days.  Wetting 6-8 diapers every 24 hours as your baby continues to grow and develop.  At least 3 stools in a 24-hour period by the age of 5 days. The stool should be soft and yellow.  At least 3 stools in a 24-hour period by the age of 7 days. The stool should be seedy and yellow.  No loss of weight greater than 10% of birth weight during the first 3 days of life.  Average weight gain of 4-7 oz (113-198 g) per week after the age of 4 days.  Consistent daily weight gain by the age of 5 days, without weight loss after the age of 2 weeks. After a feeding, your baby may spit up a small amount of milk. This is normal. Breastfeeding frequency and duration Frequent feeding will help you make more milk and can prevent sore nipples and extremely full breasts (breast engorgement). Breastfeed when you feel the need to reduce the fullness of your breasts or when your baby shows signs of hunger. This is called "breastfeeding on demand." Signs that your baby is hungry include:  Increased alertness, activity, or restlessness.  Movement of the head from side to  side.  Opening of the mouth when the corner of the mouth or cheek is stroked (rooting).  Increased sucking sounds, smacking lips, cooing, sighing, or squeaking.  Hand-to-mouth movements and sucking on fingers or hands.  Fussing or crying. Avoid introducing a pacifier to your baby in the first 4-6 weeks after your baby is born. After this time, you may choose to use a pacifier. Research has shown that pacifier use during the first year of a baby's life decreases the risk of sudden infant death syndrome (SIDS). Allow your baby to feed on each breast as long as he or she wants. When your baby unlatches or falls asleep while feeding from the first breast, offer the second breast. Because newborns are often sleepy in the first few weeks of life, you may need to awaken your baby to get him or her to feed. Breastfeeding times will vary from baby to baby. However, the following rules can serve as a guide to help you make sure that your baby is properly fed:  Newborns (babies 4 weeks of age or younger) may breastfeed every 1-3 hours.  Newborns should not go without breastfeeding for longer than 3 hours during the day or 5 hours during the night.  You should breastfeed your baby a minimum of 8 times in a 24-hour period. Breast milk pumping Pumping and storing breast milk allows you to make sure that your baby is exclusively fed your breast milk, even at times when you are unable to breastfeed. This is especially important if you go back to work while you are still breastfeeding, or if you are not able to be present during feedings. Your lactation consultant can help you find a method of pumping that works best for you and give you guidelines about how long it is safe to store breast milk.      Caring for your breasts while you breastfeed Nipples can become dry, cracked, and sore while breastfeeding. The following recommendations can help keep your breasts moisturized and healthy:  Avoid using soap on  your nipples.  Wear a supportive bra designed especially for nursing. Avoid wearing underwire-style bras or extremely tight bras (sports bras).  Air-dry   your nipples for 3-4 minutes after each feeding.  Use only cotton bra pads to absorb leaked breast milk. Leaking of breast milk between feedings is normal.  Use lanolin on your nipples after breastfeeding. Lanolin helps to maintain your skin's normal moisture barrier. Pure lanolin is not harmful (not toxic) to your baby. You may also hand express a few drops of breast milk and gently massage that milk into your nipples and allow the milk to air-dry. In the first few weeks after giving birth, some women experience breast engorgement. Engorgement can make your breasts feel heavy, warm, and tender to the touch. Engorgement peaks within 3-5 days after you give birth. The following recommendations can help to ease engorgement:  Completely empty your breasts while breastfeeding or pumping. You may want to start by applying warm, moist heat (in the shower or with warm, water-soaked hand towels) just before feeding or pumping. This increases circulation and helps the milk flow. If your baby does not completely empty your breasts while breastfeeding, pump any extra milk after he or she is finished.  Apply ice packs to your breasts immediately after breastfeeding or pumping, unless this is too uncomfortable for you. To do this: ? Put ice in a plastic bag. ? Place a towel between your skin and the bag. ? Leave the ice on for 20 minutes, 2-3 times a day.  Make sure that your baby is latched on and positioned properly while breastfeeding. If engorgement persists after 48 hours of following these recommendations, contact your health care provider or a Science writer. Overall health care recommendations while breastfeeding  Eat 3 healthy meals and 3 snacks every day. Well-nourished mothers who are breastfeeding need an additional 450-500 calories a day.  You can meet this requirement by increasing the amount of a balanced diet that you eat.  Drink enough water to keep your urine pale yellow or clear.  Rest often, relax, and continue to take your prenatal vitamins to prevent fatigue, stress, and low vitamin and mineral levels in your body (nutrient deficiencies).  Do not use any products that contain nicotine or tobacco, such as cigarettes and e-cigarettes. Your baby may be harmed by chemicals from cigarettes that pass into breast milk and exposure to secondhand smoke. If you need help quitting, ask your health care provider.  Avoid alcohol.  Do not use illegal drugs or marijuana.  Talk with your health care provider before taking any medicines. These include over-the-counter and prescription medicines as well as vitamins and herbal supplements. Some medicines that may be harmful to your baby can pass through breast milk.  It is possible to become pregnant while breastfeeding. If birth control is desired, ask your health care provider about options that will be safe while breastfeeding your baby. Where to find more information: [employer]: www.llli.org Contact a health care provider if:  You feel like you want to stop breastfeeding or have become frustrated with breastfeeding.  Your nipples are cracked or bleeding.  Your breasts are red, tender, or warm.  You have: ? Painful breasts or nipples. ? A swollen area on either breast. ? A fever or chills. ? Nausea or vomiting. ? Drainage other than breast milk from your nipples.  Your breasts do not become full before feedings by the fifth day after you give birth.  You feel sad and depressed.  Your baby is: ? Too sleepy to eat well. ? Having trouble sleeping. ? More than 66 week old and wetting fewer than  6 diapers in a 24-hour period. ? Not gaining weight by 90 days of age.  Your baby has fewer than 3 stools in a 24-hour period.  Your baby's skin or the white  parts of his or her eyes become yellow. Get help right away if:  Your baby is overly tired (lethargic) and does not want to wake up and feed.  Your baby develops an unexplained fever. Summary  Breastfeeding offers many health benefits for infant and mothers.  Try to breastfeed your infant when he or she shows early signs of hunger.  Gently tickle or stroke your baby's lips with your finger or nipple to allow the baby to open his or her mouth. Bring the baby to your breast. Make sure that much of the areola is in your baby's mouth. Offer one side and burp the baby before you offer the other side.  Talk with your health care provider or lactation consultant if you have questions or you face problems as you breastfeed. This information is not intended to replace advice given to you by your health care provider. Make sure you discuss any questions you have with your health care provider. Document Revised: 08/05/2017 Document Reviewed: 06/12/2016 Elsevier Patient Education  2021 [employer].    Postpartum Tubal Ligation Postpartum tubal ligation (PPTL) is a procedure to close the fallopian tubes. This is done to prevent pregnancy. When the fallopian tubes are closed, the eggs that the ovaries release cannot enter the uterus, and sperm cannot reach the eggs. PPTL is done right after childbirth or 1-2 days after childbirth, before the uterus returns to its normal position. If you have a cesarean section, it can be performed at the same time as the procedure. Having this done after childbirth does not make your stay in the hospital longer. PPTL is sometimes called "getting your tubes tied." You should not have this procedure if you want to get pregnant again or if you are unsure about having more children. Tell a health care provider about:  Any allergies you have.  All medicines you are taking, including vitamins, herbs, eye drops, creams, and over-the-counter medicines.  Any problems you or  family members have had with anesthetic medicines.  Any blood disorders you have.  Any surgeries you have had.  Any medical conditions you have or have had.  Any past pregnancies. What are the risks? Generally, this is a safe procedure. However, problems may occur, including:  Infection.  Bleeding.  Damage to other organs in the abdomen.  Side effects from anesthetic medicines.  Failure of the procedure. If this happens, you could get pregnant.  Ectopic pregnancy. This is a pregnancy in which the egg attaches outside the uterus. What happens before the procedure? Ask your health care provider about:  How much pain you can expect to have.  What medicines you will be given for pain, especially if you are breastfeeding. What happens during the procedure? If you had a vaginal delivery:  An IV will be inserted into one of your veins.  You will be given one or more of the following: ? A medicine to help you relax (sedative). ? A medicine to numb the area (local anesthetic). ? A medicine to make you fall asleep (general anesthetic). ? A medicine that is injected into an area of your body to numb everything below the injection site (regional anesthetic).  If you have been given a general anesthetic, a tube will be put down your throat to help you breathe.  Your bladder may be emptied with a small tube (catheter).  An incision will be made just below your belly button.  Your fallopian tubes will be located and brought up through the incision.  Your fallopian tubes will be tied off, burned (cauterized), or blocked with a clip, ring, or clamp. A small part in the center of each fallopian tube may be removed.  The incision will be closed with stitches (sutures).  A bandage (dressing) will be placed over the incision. If you had a cesarean delivery:  Tubal ligation will be done through the incision that was used for the cesarean delivery of your baby.  The incision will be  closed with sutures.  A dressing will be placed over the incision. The procedure may vary among health care providers and hospitals.   What happens after the procedure?  Your blood pressure, heart rate, breathing rate, and blood oxygen level will be monitored until you leave the hospital.  You will be given pain medicine as needed.  You will be encouraged to get up early and walk to prevent blood clots.  If you were given a sedative during the procedure, it can affect you for several hours. Do not drive or operate machinery until your health care provider says that it is safe. Summary  Postpartum tubal ligation is a procedure that closes the fallopian tubes to prevent pregnancy.  This procedure is done while you are still in the hospital after childbirth. If you have a cesarean section, it can be performed at the same time.  Having this done after childbirth does not make your stay in the hospital longer.  Postpartum tubal ligation is considered permanent. You should not have this procedure if you want to get pregnant again or if you are unsure about having more children.  Talk to your health care provider to see if this procedure is right for you. This information is not intended to replace advice given to you by your health care provider. Make sure you discuss any questions you have with your health care provider. Document Revised: 01/26/2020 Document Reviewed: 01/26/2020 Elsevier Patient Education  2021 [employer].

## 2020-06-13 NOTE — Progress Notes (Signed)
ROB-Pt present for routine prenatal care. Pt stated that she was doing well no problems.  

## 2020-06-28 ENCOUNTER — Ambulatory Visit (INDEPENDENT_AMBULATORY_CARE_PROVIDER_SITE_OTHER): Payer: Medicaid Other | Admitting: Obstetrics and Gynecology

## 2020-06-28 ENCOUNTER — Other Ambulatory Visit: Payer: Self-pay

## 2020-06-28 ENCOUNTER — Encounter: Payer: Self-pay | Admitting: Obstetrics and Gynecology

## 2020-06-28 VITALS — BP 105/70 | HR 78 | Wt 191.0 lb

## 2020-06-28 DIAGNOSIS — O0993 Supervision of high risk pregnancy, unspecified, third trimester: Secondary | ICD-10-CM

## 2020-06-28 DIAGNOSIS — Z202 Contact with and (suspected) exposure to infections with a predominantly sexual mode of transmission: Secondary | ICD-10-CM

## 2020-06-28 DIAGNOSIS — Z9889 Other specified postprocedural states: Secondary | ICD-10-CM

## 2020-06-28 DIAGNOSIS — Z3A36 36 weeks gestation of pregnancy: Secondary | ICD-10-CM

## 2020-06-28 LAB — POCT URINALYSIS DIPSTICK OB
Appearance: ABNORMAL
Bilirubin, UA: NEGATIVE
Blood, UA: POSITIVE
Glucose, UA: NEGATIVE
Ketones, UA: NEGATIVE
Nitrite, UA: NEGATIVE
Odor: ABNORMAL
POC,PROTEIN,UA: NEGATIVE
Spec Grav, UA: 1.01 (ref 1.010–1.025)
Urobilinogen, UA: 0.2 E.U./dL
pH, UA: 7.5 (ref 5.0–8.0)

## 2020-06-28 NOTE — Progress Notes (Signed)
ROB: No complaints. Signs and symptoms of labor discussed. GBS-GC/CT performed.

## 2020-06-30 LAB — STREP GP B NAA: Strep Gp B NAA: POSITIVE — AB

## 2020-07-01 LAB — GC/CHLAMYDIA PROBE AMP
Chlamydia trachomatis, NAA: NEGATIVE
Neisseria Gonorrhoeae by PCR: NEGATIVE

## 2020-07-05 ENCOUNTER — Encounter: Payer: Self-pay | Admitting: Obstetrics and Gynecology

## 2020-07-05 ENCOUNTER — Ambulatory Visit (INDEPENDENT_AMBULATORY_CARE_PROVIDER_SITE_OTHER): Payer: Medicaid Other | Admitting: Obstetrics and Gynecology

## 2020-07-05 ENCOUNTER — Other Ambulatory Visit: Payer: Self-pay

## 2020-07-05 VITALS — BP 113/73 | HR 71 | Ht 66.0 in | Wt 191.7 lb

## 2020-07-05 DIAGNOSIS — O99820 Streptococcus B carrier state complicating pregnancy: Secondary | ICD-10-CM

## 2020-07-05 DIAGNOSIS — O09293 Supervision of pregnancy with other poor reproductive or obstetric history, third trimester: Secondary | ICD-10-CM

## 2020-07-05 DIAGNOSIS — Z9889 Other specified postprocedural states: Secondary | ICD-10-CM

## 2020-07-05 DIAGNOSIS — O0993 Supervision of high risk pregnancy, unspecified, third trimester: Secondary | ICD-10-CM

## 2020-07-05 DIAGNOSIS — Z3A37 37 weeks gestation of pregnancy: Secondary | ICD-10-CM

## 2020-07-05 NOTE — Progress Notes (Signed)
Patient notes reports of mid and lower back pain. Can use belly band. Also discussed techniques for minimizing back labor with positioning (if OP positioning is suspected). Guided to [employer].  Discussed GBS status, need for antibiotics in labor.  RTC in 1 week. Given labor precautions.

## 2020-07-05 NOTE — Progress Notes (Signed)
OB-Pt present for routine prenatal care. Pt c/o middle back pain. Pt report fetal movement present, just middle back pain, no contractions or vaginal bleediing

## 2020-07-05 NOTE — Patient Instructions (Addendum)
Go to website: [email or web address] for information on changing baby's position, relief for back pain/back labor, and helping fetal descent.    Signs and Symptoms of Labor Labor is the body's natural process of moving the baby and the placenta out of the uterus. The process of labor usually starts when the baby is full-term, between 106 and 40 weeks of pregnancy. Signs and symptoms that you are close to going into labor As your body prepares for labor and the birth of your baby, you may notice the following symptoms in the weeks and days before true labor starts:  Passing a small amount of thick, bloody mucus from your vagina. This is called normal bloody show or losing your mucus plug. This may happen more than a week before labor begins, or right before labor begins, as the opening of the cervix starts to widen (dilate). For some women, the entire mucus plug passes at once. For others, pieces of the mucus plug may gradually pass over several days.  Your baby moving (dropping) lower in your pelvis to get into position for birth (lightening). When this happens, you may feel more pressure on your bladder and pelvic bone and less pressure on your ribs. This may make it easier to breathe. It may also cause you to need to urinate more often and have problems with bowel movements.  Having "practice contractions," also called Braxton Hicks contractions or false labor. These occur at irregular (unevenly spaced) intervals that are more than 10 minutes apart. False labor contractions are common after exercise or sexual activity. They will stop if you change position, rest, or drink fluids. These contractions are usually mild and do not get stronger over time. They may feel like: ? A backache or back pain. ? Mild cramps, similar to menstrual cramps. ? Tightening or pressure in your abdomen. Other early symptoms include:  Nausea or loss of appetite.  Diarrhea.  Having a sudden burst of energy, or feeling  very tired.  Mood changes.  Having trouble sleeping.   Signs and symptoms that labor has begun Signs that you are in labor may include:  Having contractions that come at regular (evenly spaced) intervals and increase in intensity. This may feel like more intense tightening or pressure in your abdomen that moves to your back. ? Contractions may also feel like rhythmic pain in your upper thighs or back that comes and goes at regular intervals. ? For first-time mothers, this change in intensity of contractions often occurs at a more gradual pace. ? Women who have given birth before may notice a more rapid progression of contraction changes.  Feeling pressure in the vaginal area.  Your water breaking (rupture of membranes). This is when the sac of fluid that surrounds your baby breaks. Fluid leaking from your vagina may be clear or blood-tinged. Labor usually starts within 24 hours of your water breaking, but it may take longer to begin. ? Some women may feel a sudden gush of fluid. ? Others notice that their underwear repeatedly becomes damp. Follow these instructions at home:  When labor starts, or if your water breaks, call your health care provider or nurse care line. Based on your situation, they will determine when you should go in for an exam.  During early labor, you may be able to rest and manage symptoms at home. Some strategies to try at home include: ? Breathing and relaxation techniques. ? Taking a warm bath or shower. ? Listening to music. ? Using a heating pad  on the lower back for pain. If you are directed to use heat:  Place a towel between your skin and the heat source.  Leave the heat on for 20-30 minutes.  Remove the heat if your skin turns bright red. This is especially important if you are unable to feel pain, heat, or cold. You may have a greater risk of getting burned.   Contact a health care provider if:  Your labor has started.  Your water breaks. Get help  right away if:  You have painful, regular contractions that are 5 minutes apart or less.  Labor starts before you are [redacted] weeks along in your pregnancy.  You have a fever.  You have bright red blood coming from your vagina.  You do not feel your baby moving.  You have a severe headache with or without vision problems.  You have severe nausea, vomiting, or diarrhea.  You have chest pain or shortness of breath. These symptoms may represent a serious problem that is an emergency. Do not wait to see if the symptoms will go away. Get medical help right away. Call your local emergency services (911 in the [place] Do not drive yourself to the hospital. Summary  Labor is your body's natural process of moving your baby and the placenta out of your uterus.  The process of labor usually starts when your baby is full-term, between 85 and 40 weeks of pregnancy.  When labor starts, or if your water breaks, call your health care provider or nurse care line. Based on your situation, they will determine when you should go in for an exam. This information is not intended to replace advice given to you by your health care provider. Make sure you discuss any questions you have with your health care provider. Document Revised: 03/02/2020 Document Reviewed: 03/02/2020 Elsevier Patient Education  2021 [employer]     Breastfeeding  Choosing to breastfeed is one of the best decisions you can make for yourself and your baby. A change in hormones during pregnancy causes your breasts to make breast milk in your milk-producing glands. Hormones prevent breast milk from being released before your baby is born. They also prompt milk flow after birth. Once breastfeeding has begun, thoughts of your baby, as well as his or her sucking or crying, can stimulate the release of milk from your milk-producing glands. Benefits of breastfeeding Research shows that breastfeeding offers many health benefits for infants and  mothers. It also offers a cost-free and convenient way to feed your baby. For your baby  Your first milk (colostrum) helps your baby's digestive system to function better.  Special cells in your milk (antibodies) help your baby to fight off infections.  Breastfed babies are less likely to develop asthma, allergies, obesity, or type 2 diabetes. They are also at lower risk for sudden infant death syndrome (SIDS).  Nutrients in breast milk are better able to meet your baby's needs compared to infant formula.  Breast milk improves your baby's brain development. For you  Breastfeeding helps to create a very special bond between you and your baby.  Breastfeeding is convenient. Breast milk costs nothing and is always available at the correct temperature.  Breastfeeding helps to burn calories. It helps you to lose the weight that you gained during pregnancy.  Breastfeeding makes your uterus return faster to its size before pregnancy. It also slows bleeding (lochia) after you give birth.  Breastfeeding helps to lower your risk of developing type 2 diabetes, osteoporosis,  rheumatoid arthritis, cardiovascular disease, and breast, ovarian, uterine, and endometrial cancer later in life. Breastfeeding basics Starting breastfeeding  Find a comfortable place to sit or lie down, with your neck and back well-supported.  Place a pillow or a rolled-up blanket under your baby to bring him or her to the level of your breast (if you are seated). Nursing pillows are specially designed to help support your arms and your baby while you breastfeed.  Make sure that your baby's tummy (abdomen) is facing your abdomen.  Gently massage your breast. With your fingertips, massage from the outer edges of your breast inward toward the nipple. This encourages milk flow. If your milk flows slowly, you may need to continue this action during the feeding.  Support your breast with 4 fingers underneath and your thumb above  your nipple (make the letter "C" with your hand). Make sure your fingers are well away from your nipple and your baby's mouth.  Stroke your baby's lips gently with your finger or nipple.  When your baby's mouth is open wide enough, quickly bring your baby to your breast, placing your entire nipple and as much of the areola as possible into your baby's mouth. The areola is the colored area around your nipple. ? More areola should be visible above your baby's upper lip than below the lower lip. ? Your baby's lips should be opened and extended outward (flanged) to ensure an adequate, comfortable latch. ? Your baby's tongue should be between his or her lower gum and your breast.  Make sure that your baby's mouth is correctly positioned around your nipple (latched). Your baby's lips should create a seal on your breast and be turned out (everted).  It is common for your baby to suck about 2-3 minutes in order to start the flow of breast milk. Latching Teaching your baby how to latch onto your breast properly is very important. An improper latch can cause nipple pain, decreased milk supply, and poor weight gain in your baby. Also, if your baby is not latched onto your nipple properly, he or she may swallow some air during feeding. This can make your baby fussy. Burping your baby when you switch breasts during the feeding can help to get rid of the air. However, teaching your baby to latch on properly is still the best way to prevent fussiness from swallowing air while breastfeeding. Signs that your baby has successfully latched onto your nipple  Silent tugging or silent sucking, without causing you pain. Infant's lips should be extended outward (flanged).  Swallowing heard between every 3-4 sucks once your milk has started to flow (after your let-down milk reflex occurs).  Muscle movement above and in front of his or her ears while sucking. Signs that your baby has not successfully latched onto your  nipple  Sucking sounds or smacking sounds from your baby while breastfeeding.  Nipple pain. If you think your baby has not latched on correctly, slip your finger into the corner of your baby's mouth to break the suction and place it between your baby's gums. Attempt to start breastfeeding again. Signs of successful breastfeeding Signs from your baby  Your baby will gradually decrease the number of sucks or will completely stop sucking.  Your baby will fall asleep.  Your baby's body will relax.  Your baby will retain a small amount of milk in his or her mouth.  Your baby will let go of your breast by himself or herself. Signs from you  Breasts  that have increased in firmness, weight, and size 1-3 hours after feeding.  Breasts that are softer immediately after breastfeeding.  Increased milk volume, as well as a change in milk consistency and color by the fifth day of breastfeeding.  Nipples that are not sore, cracked, or bleeding. Signs that your baby is getting enough milk  Wetting at least 1-2 diapers during the first 24 hours after birth.  Wetting at least 5-6 diapers every 24 hours for the first week after birth. The urine should be clear or pale yellow by the age of 5 days.  Wetting 6-8 diapers every 24 hours as your baby continues to grow and develop.  At least 3 stools in a 24-hour period by the age of 5 days. The stool should be soft and yellow.  At least 3 stools in a 24-hour period by the age of 7 days. The stool should be seedy and yellow.  No loss of weight greater than 10% of birth weight during the first 3 days of life.  Average weight gain of 4-7 oz (113-198 g) per week after the age of 4 days.  Consistent daily weight gain by the age of 5 days, without weight loss after the age of 2 weeks. After a feeding, your baby may spit up a small amount of milk. This is normal. Breastfeeding frequency and duration Frequent feeding will help you make more milk and can  prevent sore nipples and extremely full breasts (breast engorgement). Breastfeed when you feel the need to reduce the fullness of your breasts or when your baby shows signs of hunger. This is called "breastfeeding on demand." Signs that your baby is hungry include:  Increased alertness, activity, or restlessness.  Movement of the head from side to side.  Opening of the mouth when the corner of the mouth or cheek is stroked (rooting).  Increased sucking sounds, smacking lips, cooing, sighing, or squeaking.  Hand-to-mouth movements and sucking on fingers or hands.  Fussing or crying. Avoid introducing a pacifier to your baby in the first 4-6 weeks after your baby is born. After this time, you may choose to use a pacifier. Research has shown that pacifier use during the first year of a baby's life decreases the risk of sudden infant death syndrome (SIDS). Allow your baby to feed on each breast as long as he or she wants. When your baby unlatches or falls asleep while feeding from the first breast, offer the second breast. Because newborns are often sleepy in the first few weeks of life, you may need to awaken your baby to get him or her to feed. Breastfeeding times will vary from baby to baby. However, the following rules can serve as a guide to help you make sure that your baby is properly fed:  Newborns (babies 44 weeks of age or younger) may breastfeed every 1-3 hours.  Newborns should not go without breastfeeding for longer than 3 hours during the day or 5 hours during the night.  You should breastfeed your baby a minimum of 8 times in a 24-hour period. Breast milk pumping Pumping and storing breast milk allows you to make sure that your baby is exclusively fed your breast milk, even at times when you are unable to breastfeed. This is especially important if you go back to work while you are still breastfeeding, or if you are not able to be present during feedings. Your lactation consultant  can help you find a method of pumping that works best for  you and give you guidelines about how long it is safe to store breast milk.      Caring for your breasts while you breastfeed Nipples can become dry, cracked, and sore while breastfeeding. The following recommendations can help keep your breasts moisturized and healthy:  Avoid using soap on your nipples.  Wear a supportive bra designed especially for nursing. Avoid wearing underwire-style bras or extremely tight bras (sports bras).  Air-dry your nipples for 3-4 minutes after each feeding.  Use only cotton bra pads to absorb leaked breast milk. Leaking of breast milk between feedings is normal.  Use lanolin on your nipples after breastfeeding. Lanolin helps to maintain your skin's normal moisture barrier. Pure lanolin is not harmful (not toxic) to your baby. You may also hand express a few drops of breast milk and gently massage that milk into your nipples and allow the milk to air-dry. In the first few weeks after giving birth, some women experience breast engorgement. Engorgement can make your breasts feel heavy, warm, and tender to the touch. Engorgement peaks within 3-5 days after you give birth. The following recommendations can help to ease engorgement:  Completely empty your breasts while breastfeeding or pumping. You may want to start by applying warm, moist heat (in the shower or with warm, water-soaked hand towels) just before feeding or pumping. This increases circulation and helps the milk flow. If your baby does not completely empty your breasts while breastfeeding, pump any extra milk after he or she is finished.  Apply ice packs to your breasts immediately after breastfeeding or pumping, unless this is too uncomfortable for you. To do this: ? Put ice in a plastic bag. ? Place a towel between your skin and the bag. ? Leave the ice on for 20 minutes, 2-3 times a day.  Make sure that your baby is latched on and positioned  properly while breastfeeding. If engorgement persists after 48 hours of following these recommendations, contact your health care provider or a Advertising copywriterlactation consultant. Overall health care recommendations while breastfeeding  Eat 3 healthy meals and 3 snacks every day. Well-nourished mothers who are breastfeeding need an additional 450-500 calories a day. You can meet this requirement by increasing the amount of a balanced diet that you eat.  Drink enough water to keep your urine pale yellow or clear.  Rest often, relax, and continue to take your prenatal vitamins to prevent fatigue, stress, and low vitamin and mineral levels in your body (nutrient deficiencies).  Do not use any products that contain nicotine or tobacco, such as cigarettes and e-cigarettes. Your baby may be harmed by chemicals from cigarettes that pass into breast milk and exposure to secondhand smoke. If you need help quitting, ask your health care provider.  Avoid alcohol.  Do not use illegal drugs or marijuana.  Talk with your health care provider before taking any medicines. These include over-the-counter and prescription medicines as well as vitamins and herbal supplements. Some medicines that may be harmful to your baby can pass through breast milk.  It is possible to become pregnant while breastfeeding. If birth control is desired, ask your health care provider about options that will be safe while breastfeeding your baby. Where to find more information: [employer]: www.llli.org Contact a health care provider if:  You feel like you want to stop breastfeeding or have become frustrated with breastfeeding.  Your nipples are cracked or bleeding.  Your breasts are red, tender, or warm.  You have: ? Painful  breasts or nipples. ? A swollen area on either breast. ? A fever or chills. ? Nausea or vomiting. ? Drainage other than breast milk from your nipples.  Your breasts do not become full before  feedings by the fifth day after you give birth.  You feel sad and depressed.  Your baby is: ? Too sleepy to eat well. ? Having trouble sleeping. ? More than 66 week old and wetting fewer than 6 diapers in a 24-hour period. ? Not gaining weight by 29 days of age.  Your baby has fewer than 3 stools in a 24-hour period.  Your baby's skin or the white parts of his or her eyes become yellow. Get help right away if:  Your baby is overly tired (lethargic) and does not want to wake up and feed.  Your baby develops an unexplained fever. Summary  Breastfeeding offers many health benefits for infant and mothers.  Try to breastfeed your infant when he or she shows early signs of hunger.  Gently tickle or stroke your baby's lips with your finger or nipple to allow the baby to open his or her mouth. Bring the baby to your breast. Make sure that much of the areola is in your baby's mouth. Offer one side and burp the baby before you offer the other side.  Talk with your health care provider or lactation consultant if you have questions or you face problems as you breastfeed. This information is not intended to replace advice given to you by your health care provider. Make sure you discuss any questions you have with your health care provider. Document Revised: 08/05/2017 Document Reviewed: 06/12/2016 Elsevier Patient Education  2021 [employer].

## 2020-07-12 ENCOUNTER — Other Ambulatory Visit: Payer: Self-pay

## 2020-07-12 ENCOUNTER — Ambulatory Visit (INDEPENDENT_AMBULATORY_CARE_PROVIDER_SITE_OTHER): Payer: Medicaid Other | Admitting: Obstetrics and Gynecology

## 2020-07-12 ENCOUNTER — Encounter: Payer: Self-pay | Admitting: Obstetrics and Gynecology

## 2020-07-12 VITALS — BP 124/75 | HR 74 | Wt 195.1 lb

## 2020-07-12 DIAGNOSIS — Z3A38 38 weeks gestation of pregnancy: Secondary | ICD-10-CM

## 2020-07-12 DIAGNOSIS — O0993 Supervision of high risk pregnancy, unspecified, third trimester: Secondary | ICD-10-CM

## 2020-07-12 LAB — POCT URINALYSIS DIPSTICK OB
Bilirubin, UA: NEGATIVE
Blood, UA: NEGATIVE
Glucose, UA: NEGATIVE
Ketones, UA: NEGATIVE
Leukocytes, UA: NEGATIVE
Nitrite, UA: NEGATIVE
POC,PROTEIN,UA: NEGATIVE
Spec Grav, UA: 1.01 (ref 1.010–1.025)
Urobilinogen, UA: 0.2 E.U./dL
pH, UA: 6.5 (ref 5.0–8.0)

## 2020-07-12 NOTE — Progress Notes (Signed)
ROB:  No complaints.  Wants to deliver.  Miles circuit and herbal solutions discussed.  Induction for post-dates discussed.  S&S

## 2020-07-17 ENCOUNTER — Other Ambulatory Visit: Payer: Self-pay

## 2020-07-17 ENCOUNTER — Ambulatory Visit (INDEPENDENT_AMBULATORY_CARE_PROVIDER_SITE_OTHER): Payer: Medicaid Other | Admitting: Obstetrics and Gynecology

## 2020-07-17 ENCOUNTER — Encounter: Payer: Self-pay | Admitting: Obstetrics and Gynecology

## 2020-07-17 VITALS — BP 124/77 | HR 77 | Ht 66.0 in | Wt 197.6 lb

## 2020-07-17 DIAGNOSIS — Z3A39 39 weeks gestation of pregnancy: Secondary | ICD-10-CM

## 2020-07-17 DIAGNOSIS — O480 Post-term pregnancy: Secondary | ICD-10-CM

## 2020-07-17 DIAGNOSIS — O09293 Supervision of pregnancy with other poor reproductive or obstetric history, third trimester: Secondary | ICD-10-CM

## 2020-07-17 DIAGNOSIS — O0993 Supervision of high risk pregnancy, unspecified, third trimester: Secondary | ICD-10-CM

## 2020-07-17 LAB — POCT URINALYSIS DIPSTICK OB
Bilirubin, UA: NEGATIVE
Blood, UA: NEGATIVE
Glucose, UA: NEGATIVE
Ketones, UA: NEGATIVE
Leukocytes, UA: NEGATIVE
Nitrite, UA: NEGATIVE
POC,PROTEIN,UA: NEGATIVE
Spec Grav, UA: <=1.005 — AB (ref 1.010–1.025)
Urobilinogen, UA: 0.2 E.U./dL
pH, UA: 7.5 (ref 5.0–8.0)

## 2020-07-17 NOTE — Patient Instructions (Addendum)
COVID 19 Instructions for Scheduled Procedure (Inductions/C-sections and GYN surgeries)   Thank you for choosing [facility] for your services.  You have been scheduled for a procedure called _______Induction of Labor____________________.    Your procedure is scheduled on ___________Thursday, March 3, 2022_______________________.  You are required to have COVID-19 testing performed 2 days prior to your scheduled procedure date.  Testing is performed between 9 AM and 1 PM Monday through Friday.  Please present for testing on ___Tuesday, March 1, 2022_____________________ during this hour. Drive up testing is performed in front of the [facility] (this is next to the [employer]).    Upon your scheduled procedure date, you will need to arrive at the [facility] entrance. (There is a statue at the front of this entrance.)   Please arrive on time if you are scheduled for an induction of labor.   If you are scheduled for a Cesarean delivery or for Gyn Surgery, arrive 2 hours prior to your procedure time.   If you are an Obstetric patient and your arrival time falls between 11 PM and 6 AM call L&D [phone]) when you arrive.  A staff member will meet you at the [facility] entrance.  At this time, patients are allowed 1 support person to accompany them. Face masks are required for you and your support person. Your support person is now allowed to be there with you during the entire time of your admission.   Please contact the office if you have any questions regarding this information.  The [facility] office number is [phone].     Thank you,    Your [facility] Providers     Labor Induction Labor induction is when steps are taken to cause a pregnant woman to begin the labor process. Most women go into labor on their own between 37 weeks and 42 weeks of pregnancy. When this does not happen, or when there is a medical need for labor to begin, steps may be  taken to induce, or bring on, labor. Labor induction causes a pregnant woman's uterus to contract. It also causes the cervix to soften (ripen), open (dilate), and thin out. Usually, labor is not induced before 39 weeks of pregnancy unless there is a medical reason to do so. When is labor induction considered? Labor induction may be right for you if:  Your pregnancy lasts longer than 41 to 42 weeks.  Your placenta is separating from your uterus (placental abruption).  You have a rupture of membranes and your labor does not begin.  You have health problems, like diabetes or high blood pressure (preeclampsia) during your pregnancy.  Your baby has stopped growing or does not have enough amniotic fluid. Before labor induction begins, your health care provider will consider the following factors:  Your medical condition and the baby's condition.  How many weeks you have been pregnant.  How mature the baby's lungs are.  The condition of your cervix.  The position of the baby.  The size of your birth canal. Tell a health care provider about:  Any allergies you have.  All medicines you are taking, including vitamins, herbs, eye drops, creams, and over-the-counter medicines.  Any problems you or your family members have had with anesthetic medicines.  Any surgeries you have had.  Any blood disorders you have.  Any medical conditions you have. What are the risks? Generally, this is a safe procedure. However, problems may occur, including:  Failed induction.  Changes in fetal heart rate, such as being too high, too low, or irregular (erratic).  Infection in the mother or the baby.  Increased risk of having a cesarean delivery.  Breaking off (abruption) of the placenta from the uterus. This is rare.  Rupture of the uterus. This is very rare.  Your baby could fail to get enough blood flow or oxygen. This can be life-threatening. When induction is needed for medical reasons,  the benefits generally outweigh the risks. What happens during the procedure? During the procedure, your health care provider will use one of these methods to induce labor:  Stripping the membranes. In this method, the amniotic sac tissue is gently separated from the cervix. This causes the following to happen: ? Your cervix stretches, which in turn causes the release of prostaglandins. ? Prostaglandins induce labor and cause the uterus to contract. ? This procedure is often done in an office visit. You will be sent home to wait for contractions to begin.  Prostaglandin medicine. This medicine starts contractions and causes the cervix to dilate and ripen. This can be taken by mouth (orally) or by being inserted into the vagina (suppository).  Inserting a small, thin tube (catheter) with a balloon into the vagina and then expanding the balloon with water to dilate the cervix.  Breaking the water. In this method, a small instrument is used to make a small hole in the amniotic sac. This eventually causes the amniotic sac to break. Contractions should begin within a few hours.  Medicine to trigger or strengthen contractions. This medicine is given through an IV that is inserted into a vein in your arm. This procedure may vary among health care providers and hospitals.   Where to find more information  March of Dimes: www.marchofdimes.org  [employer] of [employer]ians [employer]necologists: www.acog.org Summary  Labor induction causes a pregnant woman's uterus to contract. It also causes the cervix to soften (ripen), open (dilate), and thin out.  Labor is usually not induced before 39 weeks of pregnancy unless there is a medical reason to do so.  When induction is needed for medical reasons, the benefits generally outweigh the risks.  Talk with your health care provider about which methods of labor induction are right for you. This information is not intended to replace advice given to  you by your health care provider. Make sure you discuss any questions you have with your health care provider. Document Revised: 02/22/2020 Document Reviewed: 02/22/2020 Elsevier Patient Education  2021 [employer].

## 2020-07-17 NOTE — Progress Notes (Signed)
OB-Pt present for routine prenatal care. Pt stated that she was doing well having some braxton hick contractions.

## 2020-07-17 NOTE — Progress Notes (Signed)
ROB: Doing well, no complaints. Inquires into possible IOL as she does not desire to go far beyond her due date. Discussed office practice that we typically allow pregnancy to continue up to 1 week past due date. Contacted L&D, next Thursday is next available date for IOL, will be 40.4 weeks. Patient desires membrane sweeping today. Performed at patient's request. Given labor precautions. RTC in 1 week for postdates visit and ultrasound.

## 2020-07-18 ENCOUNTER — Inpatient Hospital Stay: Payer: Medicaid Other | Admitting: Anesthesiology

## 2020-07-18 ENCOUNTER — Inpatient Hospital Stay
Admission: EM | Admit: 2020-07-18 | Discharge: 2020-07-19 | DRG: 807 | Disposition: A | Discharge status: Home / Self Care | Payer: Medicaid Other | Attending: Obstetrics and Gynecology | Admitting: Obstetrics and Gynecology

## 2020-07-18 ENCOUNTER — Encounter: Payer: Self-pay | Admitting: Obstetrics and Gynecology

## 2020-07-18 ENCOUNTER — Other Ambulatory Visit: Payer: Self-pay

## 2020-07-18 DIAGNOSIS — Z3A39 39 weeks gestation of pregnancy: Secondary | ICD-10-CM | POA: Diagnosis not present

## 2020-07-18 DIAGNOSIS — Z20822 Contact with and (suspected) exposure to covid-19: Secondary | ICD-10-CM | POA: Diagnosis not present

## 2020-07-18 DIAGNOSIS — O99824 Streptococcus B carrier state complicating childbirth: Principal | ICD-10-CM | POA: Diagnosis present

## 2020-07-18 DIAGNOSIS — O26893 Other specified pregnancy related conditions, third trimester: Secondary | ICD-10-CM | POA: Diagnosis not present

## 2020-07-18 LAB — CBC
HCT: 31.1 % — ABNORMAL LOW (ref 36.0–46.0)
Hemoglobin: 9.9 g/dL — ABNORMAL LOW (ref 12.0–15.0)
MCH: 25.7 pg — ABNORMAL LOW (ref 26.0–34.0)
MCHC: 31.8 g/dL (ref 30.0–36.0)
MCV: 80.8 fL (ref 80.0–100.0)
Platelets: 192 10*3/uL (ref 150–400)
RBC: 3.85 MIL/uL — ABNORMAL LOW (ref 3.87–5.11)
RDW: 13.4 % (ref 11.5–15.5)
WBC: 10.8 10*3/uL — ABNORMAL HIGH (ref 4.0–10.5)
nRBC: 0 % (ref 0.0–0.2)

## 2020-07-18 LAB — RESP PANEL BY RT-PCR (FLU A&B, COVID) ARPGX2
Influenza A by PCR: NEGATIVE
Influenza B by PCR: NEGATIVE
SARS Coronavirus 2 by RT PCR: NEGATIVE

## 2020-07-18 LAB — TYPE AND SCREEN
ABO/RH(D): O POS
Antibody Screen: NEGATIVE

## 2020-07-18 MED ORDER — EPHEDRINE 5 MG/ML INJ: 10.0000 mg | INTRAVENOUS | Status: DC | PRN

## 2020-07-18 MED ORDER — OXYTOCIN-SODIUM CHLORIDE 30-0.9 UT/500ML-% IV SOLN
2.5000 [IU]/h | INTRAVENOUS | Status: DC
  Filled 2020-07-18: qty 1000

## 2020-07-18 MED ORDER — PHENYLEPHRINE 40 MCG/ML (10ML) SYRINGE FOR IV PUSH (FOR BLOOD PRESSURE SUPPORT): 80.0000 ug | PREFILLED_SYRINGE | INTRAVENOUS | Status: DC | PRN

## 2020-07-18 MED ORDER — LIDOCAINE HCL (PF) 1 % IJ SOLN
INTRAMUSCULAR | Status: AC
  Filled 2020-07-18: qty 30

## 2020-07-18 MED ORDER — OXYTOCIN-SODIUM CHLORIDE 30-0.9 UT/500ML-% IV SOLN
1.0000 m[IU]/min | INTRAVENOUS | Status: DC
  Administered 2020-07-18: 4 m[IU]/min via INTRAVENOUS

## 2020-07-18 MED ORDER — TERBUTALINE SULFATE 1 MG/ML IJ SOLN: 0.2500 mg | Freq: Once | INTRAMUSCULAR | Status: DC | PRN

## 2020-07-18 MED ORDER — ACETAMINOPHEN 325 MG PO TABS: 650.0000 mg | ORAL_TABLET | ORAL | Status: DC | PRN

## 2020-07-18 MED ORDER — LIDOCAINE HCL (PF) 1 % IJ SOLN: 30.0000 mL | INTRAMUSCULAR | Status: DC | PRN

## 2020-07-18 MED ORDER — TETANUS-DIPHTH-ACELL PERTUSSIS 5-2.5-18.5 LF-MCG/0.5 IM SUSY: 0.5000 mL | PREFILLED_SYRINGE | Freq: Once | INTRAMUSCULAR | Status: DC

## 2020-07-18 MED ORDER — SOD CITRATE-CITRIC ACID 500-334 MG/5ML PO SOLN
30.0000 mL | ORAL | Status: DC | PRN
Start: 2020-07-18 — End: 2020-07-18

## 2020-07-18 MED ORDER — DIPHENHYDRAMINE HCL 25 MG PO CAPS: 25.0000 mg | ORAL_CAPSULE | Freq: Four times a day (QID) | ORAL | Status: DC | PRN

## 2020-07-18 MED ORDER — LIDOCAINE-EPINEPHRINE (PF) 1.5 %-1:200000 IJ SOLN
INTRAMUSCULAR | Status: DC | PRN
  Administered 2020-07-18: 3 mL via PERINEURAL

## 2020-07-18 MED ORDER — MISOPROSTOL 200 MCG PO TABS
ORAL_TABLET | ORAL | Status: AC
  Filled 2020-07-18: qty 4

## 2020-07-18 MED ORDER — OXYTOCIN 10 UNIT/ML IJ SOLN
INTRAMUSCULAR | Status: AC
  Filled 2020-07-18: qty 2

## 2020-07-18 MED ORDER — BUTORPHANOL TARTRATE 1 MG/ML IJ SOLN
1.0000 mg | INTRAMUSCULAR | Status: DC | PRN
  Administered 2020-07-18: 1 mg via INTRAVENOUS
  Filled 2020-07-18: qty 1

## 2020-07-18 MED ORDER — BUPIVACAINE HCL (PF) 0.25 % IJ SOLN
INTRAMUSCULAR | Status: DC | PRN
  Administered 2020-07-18 (×2): 5 mL via EPIDURAL

## 2020-07-18 MED ORDER — AMMONIA AROMATIC IN INHA
RESPIRATORY_TRACT | Status: AC
  Filled 2020-07-18: qty 10

## 2020-07-18 MED ORDER — LIDOCAINE HCL (PF) 1 % IJ SOLN
INTRAMUSCULAR | Status: DC | PRN
  Administered 2020-07-18: 3 mL

## 2020-07-18 MED ORDER — ZOLPIDEM TARTRATE 5 MG PO TABS: 5.0000 mg | ORAL_TABLET | Freq: Every evening | ORAL | Status: DC | PRN

## 2020-07-18 MED ORDER — SODIUM CHLORIDE 0.9 % IV SOLN
1.0000 g | INTRAVENOUS | Status: DC
  Administered 2020-07-18 (×2): 1 g via INTRAVENOUS
  Filled 2020-07-18 (×2): qty 1000

## 2020-07-18 MED ORDER — OXYTOCIN-SODIUM CHLORIDE 30-0.9 UT/500ML-% IV SOLN
2.5000 [IU]/h | INTRAVENOUS | Status: DC | PRN
  Administered 2020-07-18: 2.5 [IU]/h via INTRAVENOUS

## 2020-07-18 MED ORDER — SIMETHICONE 80 MG PO CHEW
80.0000 mg | CHEWABLE_TABLET | ORAL | Status: DC | PRN
Start: 2020-07-18 — End: 2020-07-20

## 2020-07-18 MED ORDER — FENTANYL 2.5 MCG/ML W/ROPIVACAINE 0.15% IN NS 100 ML EPIDURAL (ARMC)
EPIDURAL | Status: AC
  Filled 2020-07-18: qty 100

## 2020-07-18 MED ORDER — LACTATED RINGERS IV SOLN: 500.0000 mL | INTRAVENOUS | Status: DC | PRN

## 2020-07-18 MED ORDER — LACTATED RINGERS IV SOLN: INTRAVENOUS | Status: DC

## 2020-07-18 MED ORDER — PRENATAL MULTIVITAMIN CH
1.0000 | ORAL_TABLET | Freq: Every day | ORAL | Status: DC
  Administered 2020-07-19: 1 via ORAL
  Filled 2020-07-18: qty 1

## 2020-07-18 MED ORDER — DOCUSATE SODIUM 100 MG PO CAPS
100.0000 mg | ORAL_CAPSULE | Freq: Two times a day (BID) | ORAL | Status: DC
  Administered 2020-07-19: 100 mg via ORAL
  Filled 2020-07-18: qty 1

## 2020-07-18 MED ORDER — DIPHENHYDRAMINE HCL 50 MG/ML IJ SOLN: 12.5000 mg | INTRAMUSCULAR | Status: DC | PRN

## 2020-07-18 MED ORDER — OXYCODONE-ACETAMINOPHEN 5-325 MG PO TABS: 1.0000 | ORAL_TABLET | ORAL | Status: DC | PRN

## 2020-07-18 MED ORDER — OXYTOCIN BOLUS FROM INFUSION
333.0000 mL | Freq: Once | INTRAVENOUS | Status: AC
  Administered 2020-07-18: 333 mL via INTRAVENOUS

## 2020-07-18 MED ORDER — BENZOCAINE-MENTHOL 20-0.5 % EX AERO
1.0000 "application " | INHALATION_SPRAY | CUTANEOUS | Status: DC | PRN
  Administered 2020-07-18: 1 via TOPICAL
  Filled 2020-07-18: qty 56

## 2020-07-18 MED ORDER — SODIUM CHLORIDE 0.9 % IV SOLN
2.0000 g | Freq: Once | INTRAVENOUS | Status: AC
  Administered 2020-07-18: 2 g via INTRAVENOUS
  Filled 2020-07-18: qty 2000

## 2020-07-18 MED ORDER — FENTANYL 2.5 MCG/ML W/ROPIVACAINE 0.15% IN NS 100 ML EPIDURAL (ARMC)
12.0000 mL/h | EPIDURAL | Status: DC
  Administered 2020-07-18: 12 mL/h via EPIDURAL

## 2020-07-18 MED ORDER — IBUPROFEN 600 MG PO TABS
600.0000 mg | ORAL_TABLET | Freq: Four times a day (QID) | ORAL | Status: DC
  Administered 2020-07-18 – 2020-07-19 (×4): 600 mg via ORAL
  Filled 2020-07-18 (×4): qty 1

## 2020-07-18 MED ORDER — ONDANSETRON HCL 4 MG/2ML IJ SOLN: 4.0000 mg | Freq: Four times a day (QID) | INTRAMUSCULAR | Status: DC | PRN

## 2020-07-18 MED ORDER — LACTATED RINGERS IV SOLN
500.0000 mL | Freq: Once | INTRAVENOUS | Status: AC
  Administered 2020-07-18: 500 mL via INTRAVENOUS

## 2020-07-18 NOTE — Anesthesia Preprocedure Evaluation (Signed)
Anesthesia Evaluation  Patient identified by MRN, date of birth, ID band Patient awake    Reviewed: Allergy & Precautions, H&P , NPO status , Patient's Chart, lab work & pertinent test results  History of Anesthesia Complications Negative for: history of anesthetic complications  Airway Mallampati: II       Dental no notable dental hx.    Pulmonary neg pulmonary ROS,    Pulmonary exam normal        Cardiovascular negative cardio ROS Normal cardiovascular exam     Neuro/Psych negative neurological ROS  negative psych ROS   GI/Hepatic negative GI ROS, Neg liver ROS,   Endo/Other  negative endocrine ROS  Renal/GU negative Renal ROS  negative genitourinary   Musculoskeletal   Abdominal   Peds  Hematology negative hematology ROS (+)   Anesthesia Other Findings   Reproductive/Obstetrics (+) Pregnancy                             Anesthesia Physical Anesthesia Plan  ASA: II  Anesthesia Plan: Epidural   Post-op Pain Management:    Induction:   PONV Risk Score and Plan:   Airway Management Planned:   Additional Equipment:   Intra-op Plan:   Post-operative Plan:   Informed Consent: I have reviewed the patients History and Physical, chart, labs and discussed the procedure including the risks, benefits and alternatives for the proposed anesthesia with the patient or authorized representative who has indicated his/her understanding and acceptance.       Plan Discussed with: CRNA and Anesthesiologist  Anesthesia Plan Comments:         Anesthesia Quick Evaluation

## 2020-07-18 NOTE — OB Triage Note (Signed)
Pt is a G7 P1. Reports having contractions every 2-4 min, starting around 0345. Pt reports having a membrane sweep on 07/17/2020. Pt reports no LOF, no N/V/D, no headaches or blurred vision. Pt reports +FM.

## 2020-07-18 NOTE — H&P (Signed)
Patient ID: Tiffany Bowen, female   DOB: 18-Oct-1990, 30 y.o.   MRN: [id number]       History and Physical   HPI  Tiffany Bowen is a 30 y.o. [id number] at [redacted]w[redacted]d Estimated Date of Delivery: 07/21/20 who is being admitted for labor management.  C/O contractions.  Denies leakage of fluid or vag bleeding.   OB History  OB History  Gravida Para Term Preterm AB Living  7 2 1 1 4 1   SAB IAB Ectopic Multiple Live Births  3 1 0 0 1    # Outcome Date GA Lbr Len/2nd Weight Sex Delivery Anes PTL Lv  7 Current           6 SAB 2020          5 SAB 2019          4 IAB 2017          3 Term 05/2014   3402 g M Vag-Spont   LIV  2 Preterm 2014 [redacted]w[redacted]d   M Vag-Spont   FD  1 SAB 2012        ND    PROBLEM LIST  Pregnancy complications or risks: Patient Active Problem List   Diagnosis Date Noted  . Normal labor 07/18/2020  . GBS (group B Streptococcus carrier), +RV culture, currently pregnant 07/05/2020  . Anemia of pregnancy in third trimester 05/15/2020  . Supervision of normal pregnancy 02/08/2020  . 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 History of loop electrical excision procedure (LEEP) 02/08/2020  . Vaginal discharge 01/26/2018  . Abnormal uterine bleeding (AUB) 01/26/2018  . STD exposure 01/26/2018  . Pap smear of cervix with ASCUS, cannot exclude HGSIL 05/08/2016  . Chlamydia infection affecting pregnancy in first trimester 05/08/2016    Prenatal labs and studies: ABO, Rh: --/--/O POS (02/24 09-29-1996) Antibody: NEG (02/24 0951) Rubella: 1.04 (07/29 0916) RPR: Non Reactive (12/08 1024)  HBsAg: Negative (07/29 0916)  HIV: Non Reactive (07/29 0916)  09-10-1989-- (02/04 1201)   Past Medical History:  Diagnosis Date  . Abdominal pain   . Anemia   . Anxiety   . Breastfeeding (infant)   . BV (bacterial vaginosis)   . 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 Post partum  depression   . STD (sexually transmitted disease)      Past Surgical History:  Procedure Laterality Date  . INTRAUTERINE DEVICE (IUD) INSERTION N/A 07/11/2018   Procedure: INTRAUTERINE DEVICE (IUD) INSERTION Lot # [id number] Exp date Apr 2022;  Surgeon: May 2022, MD;  Location: [facility] ORS;  Service: Gynecology;  Laterality: N/A;  . LEEP N/A 07/11/2018   Procedure: LOOP ELECTROSURGICAL EXCISION PROCEDURE (LEEP);  Surgeon: 07/13/2018, MD;  Location: [facility] ORS;  Service: Gynecology;  Laterality: N/A;  . NO PAST SURGERIES       Medications    Current Discharge Medication List    CONTINUE these medications which have NOT CHANGED   Details  aspirin EC 81 MG tablet Take 1 tablet (81 mg total) by mouth daily. Take after 12 weeks for prevention of preeclampssia later in pregnancy Qty: 300 tablet, Refills: 2    Prenatal Vit-Fe Fumarate-FA (MULTIVITAMIN-PRENATAL) 27-0.8 MG TABS tablet Take 1 tablet by mouth daily at 12 noon.         Allergies  Patient has no known allergies.  Review of Systems  Pertinent items are noted in HPI.  Physical Exam  BP 132/71 (BP Location: Left Arm)   Pulse (!) 57   Temp 98.1 F (36.7 C) (Oral)   Resp 18   Ht 5\' 6"  (1.676 m)   Wt 89.4 kg   LMP 10/15/2019   BMI 31.80 kg/m   Lungs:  CTA B Cardio: RRR without M/R/G Abd: Soft, gravid, NT Presentation: cephalic EXT: No C/C/ 1+ Edema DTRs: 2+ B CERVIX: Dilation: 4 Effacement (%): 90 Cervical Position: Middle Station: -1 Presentation: Vertex Exam by:: [email or web address], MD   AROM:  Clear small.  See Prenatal records for more detailed PE.     FHR:  Variability: Good {> 6 bpm)  Toco: Uterine Contractions: Irregular mild.  Test Results  Results for orders placed or performed during the hospital encounter of 07/18/20 (from the past 24 hour(s))  Resp Panel by RT-PCR (Flu A&B, Covid) Nasopharyngeal Swab     Status: None   Collection Time: 07/18/20  9:10 AM   Specimen: Nasopharyngeal  Swab; Nasopharyngeal(NP) swabs in vial transport medium  Result Value Ref Range   SARS Coronavirus 2 by RT PCR NEGATIVE NEGATIVE   Influenza A by PCR NEGATIVE NEGATIVE   Influenza B by PCR NEGATIVE NEGATIVE  Type and screen [facility]     Status: None   Collection Time: 07/18/20  9:51 AM  Result Value Ref Range   ABO/RH(D) O POS    Antibody Screen NEG    Sample Expiration      07/21/2020,2359 Performed at [facility], [address] [place], [place]   CBC     Status: Abnormal   Collection Time: 07/18/20 10:33 AM  Result Value Ref Range   WBC 10.8 (H) 4.0 - 10.5 K/uL   RBC 3.85 (L) 3.87 - 5.11 MIL/uL   Hemoglobin 9.9 (L) 12.0 - 15.0 g/dL   HCT 07/20/20 (L) 54.6 - 56.8 %   MCV 80.8 80.0 - 100.0 fL   MCH 25.7 (L) 26.0 - 34.0 pg   MCHC 31.8 30.0 - 36.0 g/dL   RDW 12.7 51.7 - 00.1 %   Platelets 192 150 - 400 K/uL   nRBC 0.0 0.0 - 0.2 %   Group B Strep positive  Assessment   30 at [redacted]w[redacted]d Estimated Date of Delivery: 07/21/20  The fetus is reassuring.   Patient Active Problem List   Diagnosis Date Noted  . Normal labor 07/18/2020  . GBS (group B Streptococcus carrier), +RV culture, currently pregnant 07/05/2020  . Anemia of pregnancy in third trimester 05/15/2020  . Supervision of normal pregnancy 02/08/2020  . 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 History of loop electrical excision procedure (LEEP) 02/08/2020  . Vaginal discharge 01/26/2018  . Abnormal uterine bleeding (AUB) 01/26/2018  . STD exposure 01/26/2018  . Pap smear of cervix with ASCUS, cannot exclude HGSIL 05/08/2016  . Chlamydia infection affecting pregnancy in first trimester 05/08/2016    Plan  1. Admit to L&D :   2. EFM: -- Category 1 3. Stadol or Epidural if desired.   4. Admission labs  5. ABX given for GBS 6. Follow for continued cervical change.  Pitocin as needed.   05/10/2016,  M.D. 07/18/2020 12:23 PM

## 2020-07-18 NOTE — Anesthesia Procedure Notes (Signed)
Epidural Patient location during procedure: OB Start time: 07/18/2020 4:20 PM End time: 07/18/2020 4:43 PM  Staffing Resident/CRNA: Junious Silk, CRNA Performed: resident/CRNA   Preanesthetic Checklist Completed: patient identified, IV checked, site marked, risks and benefits discussed, surgical consent, monitors and equipment checked, pre-op evaluation and timeout performed  Epidural Patient position: sitting Prep: Betadine Patient monitoring: heart rate, continuous pulse ox and blood pressure Approach: midline Location: L3-L4 Injection technique: LOR saline  Needle:  Needle type: Tuohy  Needle gauge: 17 G Needle length: 9 cm and 9 Catheter type: closed end flexible Catheter size: 20 Guage Test dose: negative and 1.5% lidocaine with Epi 1:200 K  Assessment Sensory level: T10 Events: blood not aspirated, injection not painful, no injection resistance, no paresthesia and negative IV test  Additional Notes   Patient tolerated the insertion well without complications.Reason for block:procedure for pain

## 2020-07-19 LAB — RPR: RPR Ser Ql: NONREACTIVE

## 2020-07-19 NOTE — Anesthesia Postprocedure Evaluation (Signed)
Anesthesia Post Note  Patient: Tiffany Bowen  Procedure(s) Performed: AN AD HOC LABOR EPIDURAL  Patient location during evaluation: Mother Baby Anesthesia Type: Epidural Level of consciousness: oriented and awake and alert Pain management: pain level controlled Vital Signs Assessment: post-procedure vital signs reviewed and stable Respiratory status: spontaneous breathing and respiratory function stable Cardiovascular status: blood pressure returned to baseline and stable Postop Assessment: no headache, no backache, no apparent nausea or vomiting and able to ambulate Anesthetic complications: no   No complications documented.   Last Vitals:  Vitals:   07/18/20 2318 07/19/20 0309  BP: 128/82 127/85  Pulse: 60 (!) 57  Resp: 18 18  Temp: 37.1 C 36.6 C  SpO2: 100% 100%    Last Pain:  Vitals:   07/19/20 0309  TempSrc: Oral  PainSc:                  [name]

## 2020-07-19 NOTE — Discharge Summary (Signed)
Patient Name: Tiffany Bowen DOB: [birth year] MRN: [id number]                            Discharge Summary  Date of Admission: 07/18/2020 Date of Discharge: 07/19/2020 Delivering Provider: Linzie Collin   Admitting Diagnosis: Normal labor [O80, Z37.9] at [redacted]w[redacted]d Secondary diagnosis:  Active Problems:   Normal labor   Mode of Delivery: normal spontaneous vaginal delivery              Discharge diagnosis: Term Pregnancy Delivered      Intrapartum Procedures: Atificial rupture of membranes, epidural, GBS prophylaxis and pitocin augmentation   Post partum procedures:   Complications: none                     Discharge Day SOAP Note:  Progress Note - Vaginal Delivery  ANNIKA SELKE is a 30 y.o. [id number] now PP day 1 s/p Vaginal, Spontaneous . Delivery was uncomplicated  Subjective  The patient has the following complaints: has no unusual complaints  Pain is controlled with current medications.   Patient is urinating without difficulty.  She is ambulating well.   She is breast-feeding without problem. She desires discharge today.  Objective  Vital signs: BP 127/81 (BP Location: Right Arm)   Pulse 61   Temp 98.4 F (36.9 C) (Oral)   Resp 18   Ht 5\' 6"  (1.676 m)   Wt 89.4 kg   LMP 10/15/2019   SpO2 100%   Breastfeeding Unknown   BMI 31.80 kg/m   Physical Exam: Gen: NAD Fundus Fundal Tone: Firm  Lochia Amount: Small        Data Review Labs: Lab Results  Component Value Date   WBC 10.8 (H) 07/18/2020   HGB 9.9 (L) 07/18/2020   HCT 31.1 (L) 07/18/2020   MCV 80.8 07/18/2020   PLT 192 07/18/2020   CBC Latest Ref Rng & Units 07/18/2020 05/01/2020 01/11/2020  WBC 4.0 - 10.5 K/uL 10.8(H) 7.0 6.5  Hemoglobin 12.0 - 15.0 g/dL 01/13/2020) 10.0(L) 11.2  Hematocrit 36.0 - 46.0 % 31.1(L) 30.5(L) 33.6(L)  Platelets 150 - 400 K/uL 192 169 203   O POS  Edinburgh Score: Edinburgh Postnatal Depression Scale Screening Tool 07/18/2020  I have been able to laugh  and see the funny side of things. 0  I have looked forward with enjoyment to things. 0  I have blamed myself unnecessarily when things went wrong. 0  I have been anxious or worried for no good reason. 0  I have felt scared or panicky for no good reason. 1  Things have been getting on top of me. 1  I have been so unhappy that I have had difficulty sleeping. 0  I have felt sad or miserable. 1  I have been so unhappy that I have been crying. 1  The thought of harming myself has occurred to me. 0  Edinburgh Postnatal Depression Scale Total 4    Assessment/Plan  Active Problems:   Normal labor    Plan for discharge today.  Discharge Instructions: Per After Visit Summary. Activity: Advance as tolerated. Pelvic rest for 6 weeks.  Also refer to After Visit Summary Diet: Regular Medications: Allergies as of 07/19/2020   No Known Allergies     Medication List    STOP taking these medications   aspirin EC 81 MG tablet     TAKE these medications  multivitamin-prenatal 27-0.8 MG Tabs tablet Take 1 tablet by mouth daily at 12 noon.      Outpatient follow up:   Follow-up Information    Linzie Collin, MD Follow up in 4 week(s).   Specialties: Obstetrics and Gynecology, Radiology Contact information: [address] [place] [address] [phone]              Postpartum contraception: Will discuss at first office visit post-partum  Discharged Condition: good  Discharged to: home  Newborn Data: Disposition:home with mother  Apgars: APGAR (1 MIN): 8   APGAR (5 MINS): 9   APGAR (10 MINS):    Baby Feeding: Breast    Elonda Husky, M.D. 07/19/2020 11:14 AM

## 2020-07-19 NOTE — Lactation Note (Signed)
This note was copied from a baby's chart. Lactation Consultation Note  Patient Name: Tiffany Bowen [id number] Date: 07/19/2020 Reason for consult: Initial assessment Age:32 hours  Maternal Data Has patient been taught Hand Expression?: Yes Does the patient have breastfeeding experience prior to this delivery?: Yes How long did the patient breastfeed?:  Feeding Mother's Current Feeding Choice: Breast Milk Baby fussy, encouraged mom to offer breast, once place at breast, would root and then fall asleep without latching, baby then passed gas, mom encouraged to attempt again in 1 hr, just fed at 1600    [facility] Score Latch: Too sleepy or reluctant, no latch achieved, no sucking elicited.  Audible Swallowing: None  Type of Nipple: Everted at rest and after stimulation  Comfort (Breast/Nipple): Soft / non-tender  Hold (Positioning): Assistance needed to correctly position infant at breast and maintain latch.  LATCH Score: 5   Lactation Tools Discussed/Used  Begin offering breast every 2-3 hrs or at least 8x/24 hrs, LC name and no written on white board   Interventions Interventions: Breast feeding basics reviewed;Assisted with latch  Discharge Pump: Personal WIC Program: Yes  Consult Status Consult Status: PRN    Dyann Kief 07/19/2020, 6:47 PM

## 2020-07-19 NOTE — Discharge Instructions (Signed)
Breastfeeding Tips for a Good Latch Latching is how your baby's mouth attaches to your nipple to breastfeed. It is an important part of breastfeeding. Your baby may have trouble latching for a number of reasons, such as:  Not being in the right position.  Using a bottle or pacifier too early.  Problems within your baby's mouth, tongue, or lips.  The shape of your nipples.  Your baby being born early (prematurely). Small babies often have a weak suck.  Breasts becoming overfilled with milk (engorged breasts).  Express a little milk to help soften the breast. Work with a breastfeeding specialist (lactation consultant) to help your baby have a good latch. How does this affect me? A poor latch may cause you to have problems such as:  Cracked nipples.  Sore nipples.  Breasts becoming overfilled with milk  Plugged milk ducts.  Low milk supply.  Breast inflammation.  Breast infection. How does this affect my baby? A poor latch may cause your baby to not be able to feed well. As a result, he or she may have trouble gaining weight. Follow these instructions at home: How to position your baby  Find a comfortable place to sit or lie down. Your neck and back should be well supported.  If you are seated, place a pillow or rolled-up blanket under your baby. This will bring him or her to the level of your breast.  Make sure that your baby's belly is facing your belly.  Try different positions to find one that works best for you and your baby. How to help your baby latch  To start, you might find it helpful to gently rub your breast. Move your fingertips in a circle as you massage from your chest wall toward your nipple. This helps milk flow. Keep doing this during feeding if needed.  Position your breast. Hold your breast with four fingers underneath and your thumb above your nipple. Keep your fingers away from your nipple and your baby's mouth. Follow these steps to help your baby  latch: 1. Rub your baby's lips gently with your finger or nipple. 2. When your baby's mouth is open wide enough, quickly bring your baby to your breast and place your whole nipple into your baby's mouth. Place as much of the colored area around your nipple (areola)as possible into your baby's mouth. 3. Your baby's tongue should be between his or her lower gum and your breast. 4. You should be able to see more areola above your baby's upper lip than below the lower lip. 5. When your baby starts sucking, you will feel a gentle pull on your nipple. You should not feel any pain. Be patient. It is common for a baby to suck for about 2-3 minutes to start the flow of breast milk. 6. Make sure that your baby's mouth is in the right position around your nipple. Your baby's lips should make a seal on your breast and be turned outward.   General instructions  Look for these signs that your baby has latched on to your nipple: ? The baby is quietly tugging or sucking without causing you pain. ? You hear the baby swallow after every 3 or 4 sucks. ? You see movement above and in front of the baby's ears while he or she is sucking.  Be aware of these signs that your baby has not latched on to your nipple: ? The baby makes sucking sounds or smacking sounds while feeding. ? You have nipple pain.    If your baby is not latched well, put your little finger between your baby's gums and your nipple. This will break the seal. Then try to help your baby latch again.  If you need help, get help from a breastfeeding specialist. Contact a doctor if:  You have cracking or soreness in your nipples that lasts longer than 1 week.  You have nipple pain.  Your breasts are filled with too much milk (engorgement), and this does not improve after 48-72 hours.  You have a plugged milk duct and a fever.  You follow the tips for a good latch but need more help.  You have a pus-like fluid coming from your breast.  Your  baby is not gaining weight.  Your baby loses weight. Summary  Latching is how your baby's mouth attaches to your nipple to breastfeed.  Try different positions for breastfeeding to find one that works best for you and your baby.  A poor latch may cause you to have cracked or sore nipples or other problems.  Work with a breastfeeding specialist (lactation consultant) to help your baby have a good latch. This information is not intended to replace advice given to you by your health care provider. Make sure you discuss any questions you have with your health care provider. Document Revised: 11/08/2019 Document Reviewed: 11/08/2019 Elsevier Patient Education  2021 [employer] Postpartum Care After Vaginal Delivery The following information offers guidance about how to care for yourself from the time you deliver your baby to 6-12 weeks after delivery (postpartum period). If you have problems or questions, contact your health care provider for more specific instructions. Follow these instructions at home: Vaginal bleeding  It is normal to have vaginal bleeding (lochia) after delivery. Wear a sanitary pad for bleeding and discharge. ? During the first week after delivery, the amount and appearance of lochia is often similar to a menstrual period. ? Over the next few weeks, it will gradually decrease to a dry, yellow-brown discharge. ? For most women, lochia stops completely by 4-6 weeks after delivery, but can vary.  Change your sanitary pads frequently. Watch for any changes in your flow, such as: ? A sudden increase in volume. ? A change in color. ? Large blood clots.  If you pass a blood clot from your vagina, save it and call your health care provider. Do not flush blood clots down the toilet before talking with your health care provider.  Do not use tampons or douches until your health care provider approves.  If you are not breastfeeding, your period should return 6-8 weeks after  delivery. If you are feeding your baby breast milk only, your period may not return until you stop breastfeeding. Perineal care  Keep the area between the vagina and the anus (perineum) clean and dry. Use medicated pads and pain-relieving sprays and creams as directed.  If you had a surgical cut in the perineum (episiotomy) or a tear, check the area for signs of infection until you are healed. Check for: ? More redness, swelling, or pain. ? Fluid or blood coming from the cut or tear. ? Warmth. ? Pus or a bad smell.  You may be given a squirt bottle to use instead of wiping to clean the perineum area after you use the bathroom. Pat the area gently to dry it.  To relieve pain caused by an episiotomy, a tear, or swollen veins in the anus (hemorrhoids), take a warm sitz bath 2-3 times a day. In a   sitz bath, the warm water should only come up to your hips and cover your buttocks.   Breast care  In the first few days after delivery, your breasts may feel heavy, full, and uncomfortable (breast engorgement). Milk may also leak from your breasts. Ask your health care provider about ways to help relieve the discomfort.  If you are breastfeeding: ? Wear a bra that supports your breasts and fits well. Use breast pads to absorb milk that leaks. ? Keep your nipples clean and dry. Apply creams and ointments as told. ? You may have uterine contractions every time you breastfeed for up to several weeks after delivery. This helps your uterus return to its normal size. ? If you have any problems with breastfeeding, notify your health care provider or lactation consultant.  If you are not breastfeeding: ? Avoid touching your breasts. Do not squeeze out (express) milk. Doing this can make your breasts produce more milk. ? Wear a good-fitting bra and use cold packs to help with swelling. Intimacy and sexuality  Ask your health care provider when you can engage in sexual activity. This may depend upon: ? Your  risk of infection. ? How fast you are healing. ? Your comfort and desire to engage in sexual activity.  You are able to get pregnant after delivery, even if you have not had your period. Talk with your health care provider about methods of birth control (contraception) or family planning if you desire future pregnancies. Medicines  Take over-the-counter and prescription medicines only as told by your health care provider.  Take an over-the-counter stool softener to help ease bowel movements as told by your health care provider.  If you were prescribed an antibiotic medicine, take it as told by your health care provider. Do not stop taking the antibiotic even if you start to feel better.  Review all previous and current prescriptions to check for possible transfer into breast milk. Activity  Gradually return to your normal activities as told by your health care provider.  Rest as much as possible. Nap while your baby is sleeping. Eating and drinking  Drink enough fluid to keep your urine pale yellow.  To help prevent or relieve constipation, eat high-fiber foods every day.  Choose healthy eating to support breastfeeding or weight loss goals.  Take your prenatal vitamins until your health care provider tells you to stop.   General tips/recommendations  Do not use any products that contain nicotine or tobacco. These products include cigarettes, chewing tobacco, and vaping devices, such as e-cigarettes. If you need help quitting, ask your health care provider.  Do not drink alcohol, especially if you are breastfeeding.  Do not take medications or drugs that are not prescribed to you, especially if you are breastfeeding.  Visit your health care provider for a postpartum checkup within the first 3-6 weeks after delivery.  Complete a comprehensive postpartum visit no later than 12 weeks after delivery.  Keep all follow-up visits for you and your baby. Contact a health care provider  if:  You feel unusually sad or worried.  Your breasts become red, painful, or hard.  You have a fever or other signs of an infection.  You have bleeding that is soaking through one pad an hour or you have blood clots.  You have a severe headache that doesn't go away or you have vision changes.  You have nausea and vomiting and are unable to eat or drink anything for 24 hours. Get help right away   if:  You have chest pain or difficulty breathing.  You have sudden, severe leg pain.  You faint or have a seizure.  You have thoughts about hurting yourself or your baby. If you ever feel like you may hurt yourself or others, or have thoughts about taking your own life, get help right away. Go to your nearest emergency department or:  Call your local emergency services (911 in the [place]  The National Suicide Prevention Lifeline at 1-800-273-8255. This suicide crisis helpline is open 24 hours a day.  Text the Crisis Text Line at 741741 (in the [place] Summary  The period of time after you deliver your newborn up to 6-12 weeks after delivery is called the postpartum period.  Keep all follow-up visits for you and your baby.  Review all previous and current prescriptions to check for possible transfer into breast milk.  Contact a health care provider if you feel unusually sad or worried during the postpartum period. This information is not intended to replace advice given to you by your health care provider. Make sure you discuss any questions you have with your health care provider. Document Revised: 01/25/2020 Document Reviewed: 01/25/2020 Elsevier Patient Education  2021 Elsevier Inc.  

## 2020-07-19 NOTE — Progress Notes (Signed)
Pt discharged home in the company of baby and husband. Discharge instructions reviewed and pt verbalized understanding.

## 2020-07-24 ENCOUNTER — Other Ambulatory Visit: Payer: Medicaid Other

## 2020-07-24 ENCOUNTER — Encounter: Payer: Medicaid Other | Admitting: Obstetrics and Gynecology

## 2020-08-14 ENCOUNTER — Ambulatory Visit: Payer: No Typology Code available for payment source

## 2020-08-15 ENCOUNTER — Other Ambulatory Visit: Payer: Self-pay

## 2020-08-15 ENCOUNTER — Encounter: Payer: Medicaid Other | Admitting: Obstetrics and Gynecology

## 2020-08-19 ENCOUNTER — Encounter: Payer: Self-pay | Admitting: Obstetrics and Gynecology

## 2020-08-19 ENCOUNTER — Ambulatory Visit (INDEPENDENT_AMBULATORY_CARE_PROVIDER_SITE_OTHER): Payer: Medicaid Other | Admitting: Obstetrics and Gynecology

## 2020-08-19 ENCOUNTER — Other Ambulatory Visit: Payer: Self-pay

## 2020-08-19 DIAGNOSIS — Z3042 Encounter for surveillance of injectable contraceptive: Secondary | ICD-10-CM | POA: Diagnosis not present

## 2020-08-19 LAB — POCT URINE PREGNANCY: Preg Test, Ur: NEGATIVE

## 2020-08-19 MED ORDER — MEDROXYPROGESTERONE ACETATE 150 MG/ML IM SUSP
150.0000 mg | Freq: Once | INTRAMUSCULAR | Status: AC
Start: 2020-08-19 — End: 2020-08-19
  Administered 2020-08-19: 150 mg via INTRAMUSCULAR

## 2020-08-19 NOTE — Addendum Note (Signed)
Addended by: Dorian Pod on: 08/19/2020 11:37 AM   Modules accepted: Orders

## 2020-08-19 NOTE — Progress Notes (Signed)
HPI:      Tiffany Bowen is a 30 y.o. [id number] who LMP was Patient's last menstrual period was 10/15/2019.  Subjective:   She presents today approximately 4 weeks postpartum.  She continues to breast-feed full-time.  She says that she is doing well.  She plans to start a new job.  She is looking forward to her new job and getting back to her "real life". She desires birth control.    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 She does not have any pertinent problems on file. She  has a past surgical history that includes No past surgeries; LEEP (N/A, 07/11/2018); and Intrauterine device (iud) insertion (N/A, 07/11/2018). Her family history includes Hypertension in her mother. She  reports that she has never smoked. She has never used smokeless tobacco. She reports that she does not drink alcohol and does not use drugs. She has a current medication list which includes the following prescription(s): multivitamin-prenatal. She has No Known Allergies.       Review of Systems:  Review of Systems  Constitutional: Denied constitutional symptoms, night sweats, recent illness, fatigue, fever, insomnia and weight loss.  Eyes: Denied eye symptoms, eye pain, photophobia, vision change and visual disturbance.  Ears/Nose/Throat/Neck: Denied ear, nose, throat or neck symptoms, hearing loss, nasal discharge, sinus congestion and sore throat.  Cardiovascular: Denied cardiovascular symptoms, arrhythmia, chest pain/pressure, edema, exercise intolerance, orthopnea and palpitations.  Respiratory: Denied pulmonary symptoms, asthma, pleuritic pain, productive sputum, cough, dyspnea and wheezing.   Gastrointestinal: Denied, gastro-esophageal reflux, melena, nausea and vomiting.  Genitourinary: Denied genitourinary symptoms including symptomatic vaginal discharge, pelvic relaxation issues, and urinary complaints.  Musculoskeletal: Denied musculoskeletal symptoms, stiffness, swelling, muscle weakness and myalgia.  Dermatologic: Denied dermatology symptoms, rash and scar.  Neurologic: Denied neurology symptoms, dizziness, headache, neck pain and syncope.  Psychiatric: Denied psychiatric symptoms, anxiety and depression.  Endocrine: Denied endocrine symptoms including hot flashes and night sweats.   Meds:   Current Outpatient Medications on File Prior to Visit  Medication Sig Dispense Refill  . Prenatal Vit-Fe Fumarate-FA (MULTIVITAMIN-PRENATAL) 27-0.8 MG TABS tablet Take 1 tablet by mouth daily at 12 noon.     No current facility-administered medications on file prior to visit.       The pregnancy intention screening data noted above was reviewed. Potential methods of contraception were discussed. The patient elected to proceed with Hormonal Injection.     Objective:     Vitals:   08/19/20 0911  BP: 103/68  Pulse: 73   Filed Weights   08/19/20 0911  Weight: 175 lb (79.4 kg)              Pelvic examination   Pelvic:   Vulva: Normal appearance.  No lesions.  No abnormal scarring.    Vagina: No lesions or abnormalities noted.  Support: Normal pelvic support.  Urethra No masses tenderness or scarring.  Meatus Normal size without lesions or prolapse.  Cervix: Normal ectropion.  No lesions.  Anus: Normal exam.  No lesions.  Perineum: Normal exam.  No lesions.  Healed well.          Bimanual   Uterus: Normal size.  Non-tender.  Mobile.  AV.  Adnexae: No masses.  Non-tender to palpation.  Cul-de-sac: Negative for abnormality.   PHQ-9 = 18  Assessment:    [id number] Patient Active Problem List   Diagnosis Date Noted  . Normal labor 07/18/2020  . GBS (group B  Streptococcus carrier), +RV culture, currently pregnant 07/05/2020  . Anemia of pregnancy in third trimester 05/15/2020  . Supervision of normal pregnancy 02/08/2020  . 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 History of loop electrical excision procedure (LEEP) 02/08/2020  . Vaginal discharge 01/26/2018  . Abnormal uterine bleeding (AUB) 01/26/2018  . STD exposure 01/26/2018  . Pap smear of cervix with ASCUS, cannot exclude HGSIL 05/08/2016  . Chlamydia infection affecting pregnancy in first trimester 05/08/2016     1. Postpartum care and examination immediately after delivery     Patient says her PHQ-9 does not reflect her mood.  She says that she thinks she has "a little" postpartum depression but thinks "it will pass".  She is not interested in considering medication at this time.  Patient desires Depo-Provera today for birth control but she had Mirena before and is considering this for long-term birth control.   Plan:            1.  Patient to immediately let us know if her postpartum depression does not "pass".  2.  Depo-Provera today, patient to schedule for IUD if she desires this in the near future.  3.  May resume normal activities.  Orders Orders Placed This Encounter  Procedures  . POCT urine pregnancy    No orders of the defined types were placed in this encounter.     F/U  No follow-ups on file.  Elonda Husky, M.D. 08/19/2020 10:44 AM

## 2020-09-28 IMAGING — US US OB COMP LESS 14 WK
1 series · 14 of 28 positions shown · non-contrast
Comparison: May 30, 2018

CLINICAL DATA: Bleeding in early pregnancy.

EXAM:
OBSTETRIC <14 WK US AND TRANSVAGINAL OB US
TECHNIQUE: Both transabdominal and transvaginal ultrasound examinations were
performed for complete evaluation of the gestation as well as the
maternal uterus, adnexal regions, and pelvic cul-de-sac.
Transvaginal technique was performed to assess early pregnancy.

[Series 1: us ob comp less 14 wk · 131 acquisitions, 14 frames shown]
[im 5/131]
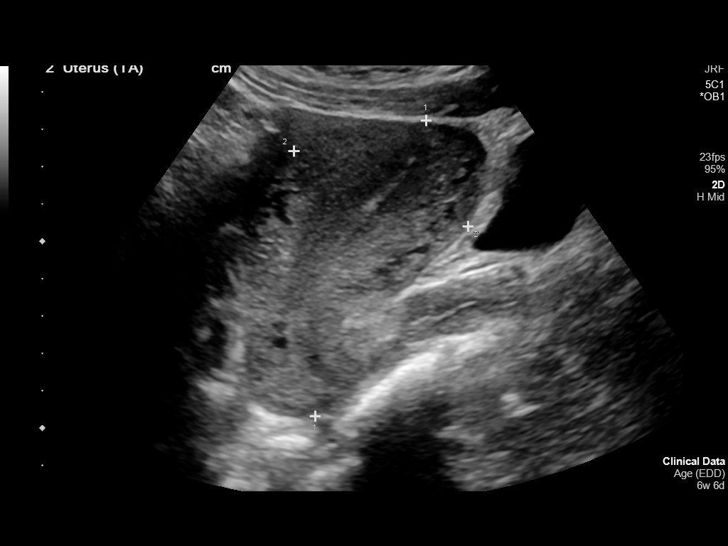
[im 15/131]
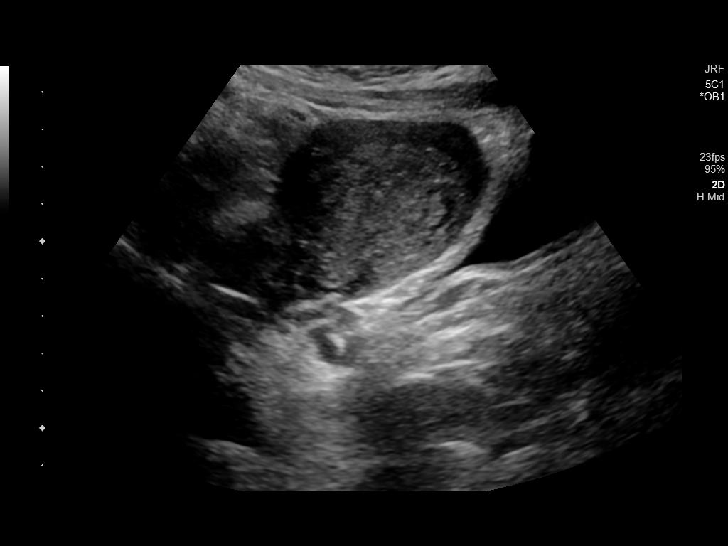
[im 25/131]
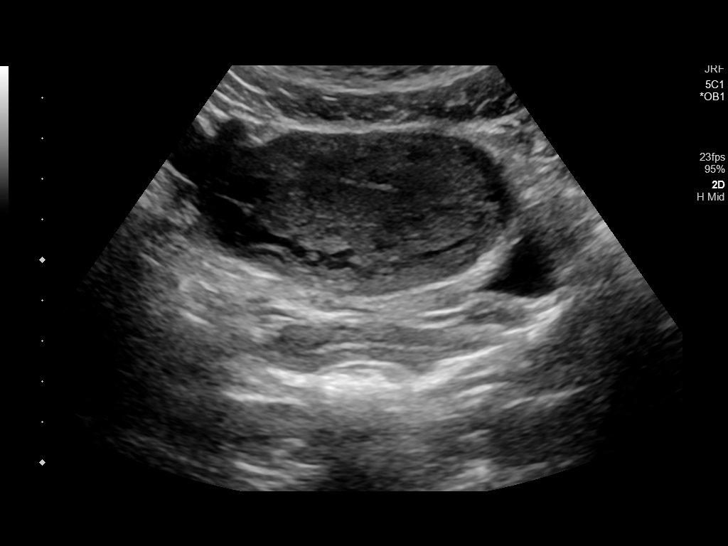
[im 34/131]
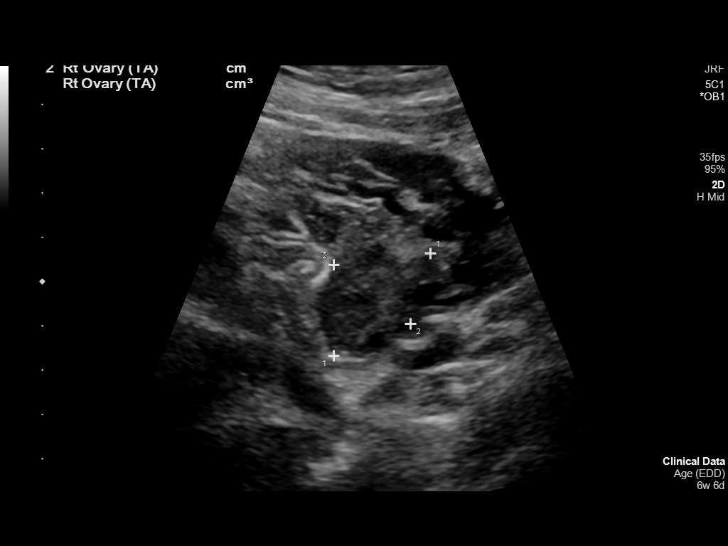
[im 44/131]
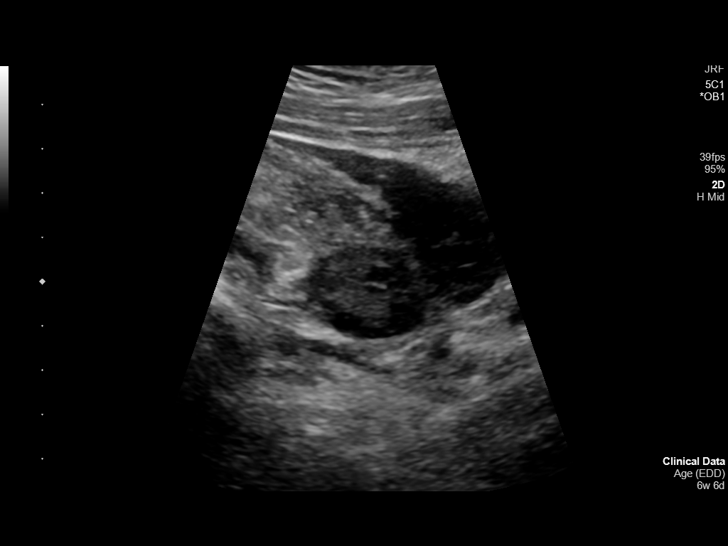
[im 53/131]
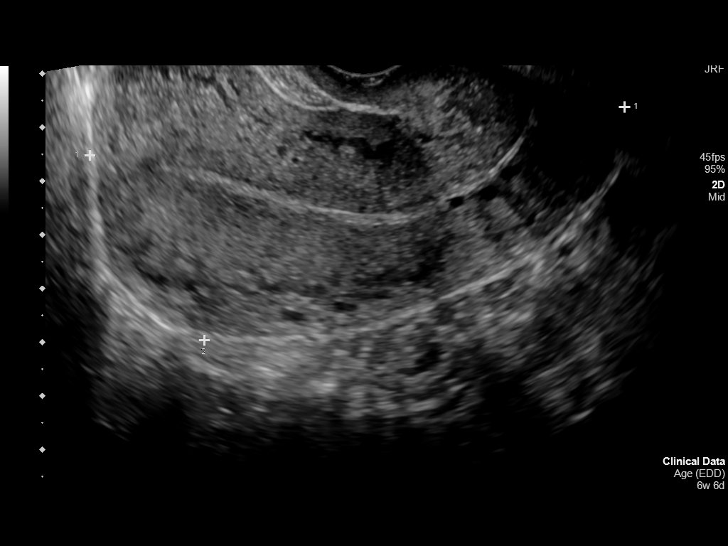
[im 63/131]
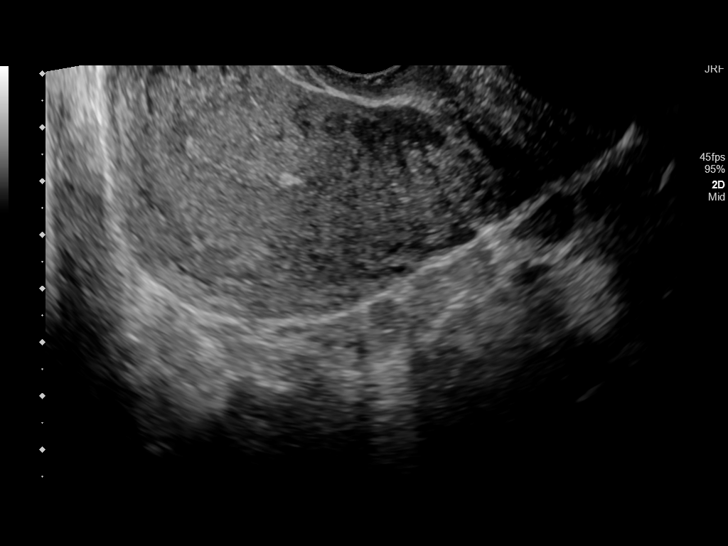
[im 73/131]
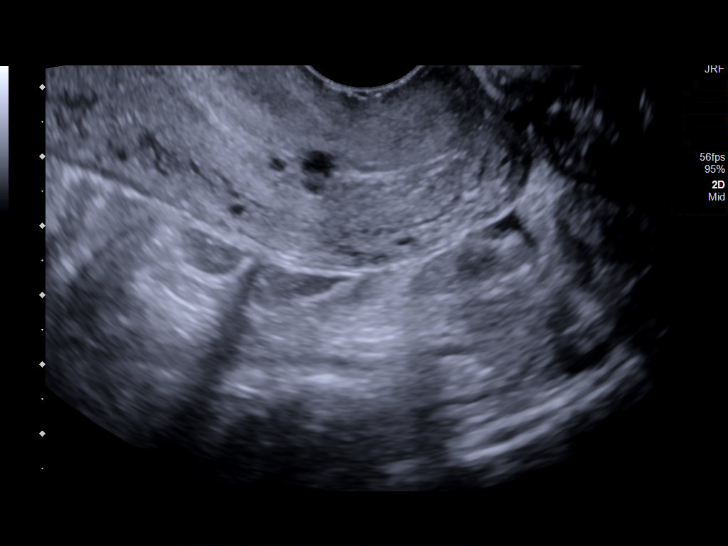
[im 82/131]
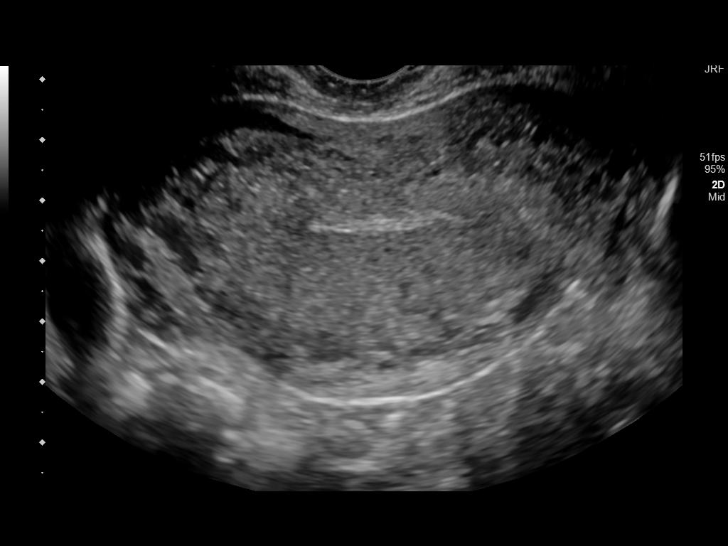
[im 92/131]
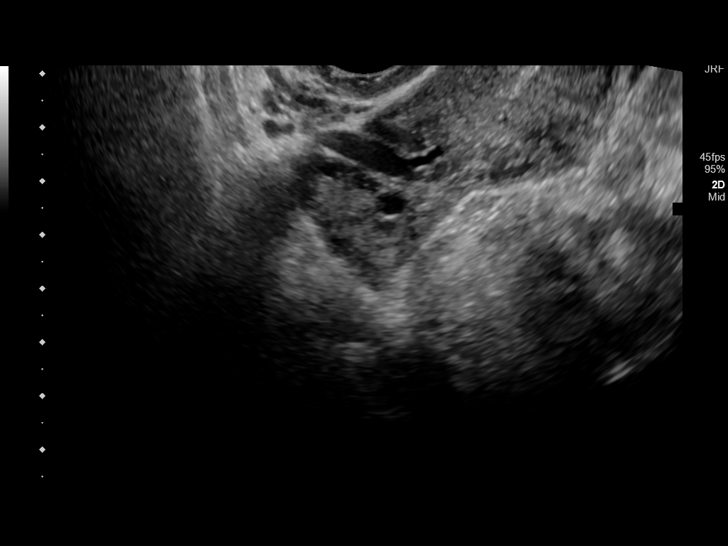
[im 102/131]
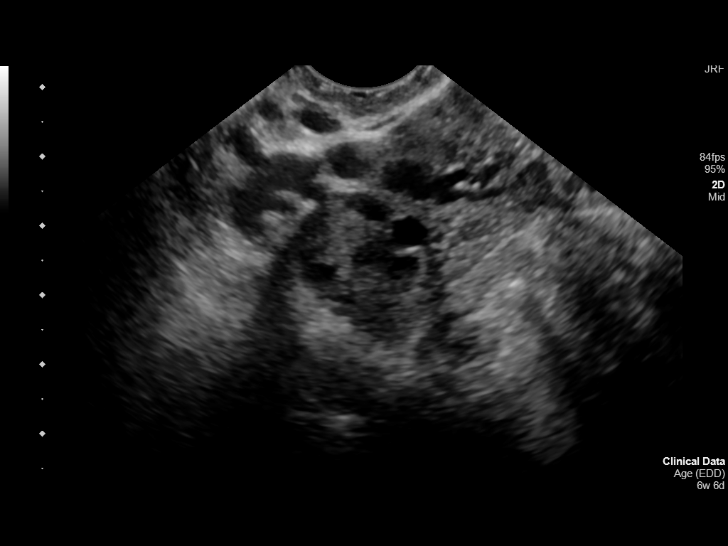
[im 111/131]
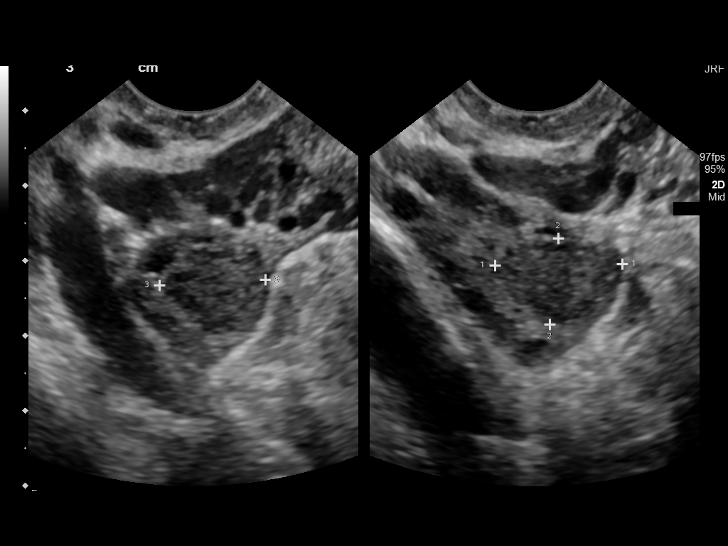
[im 121/131]
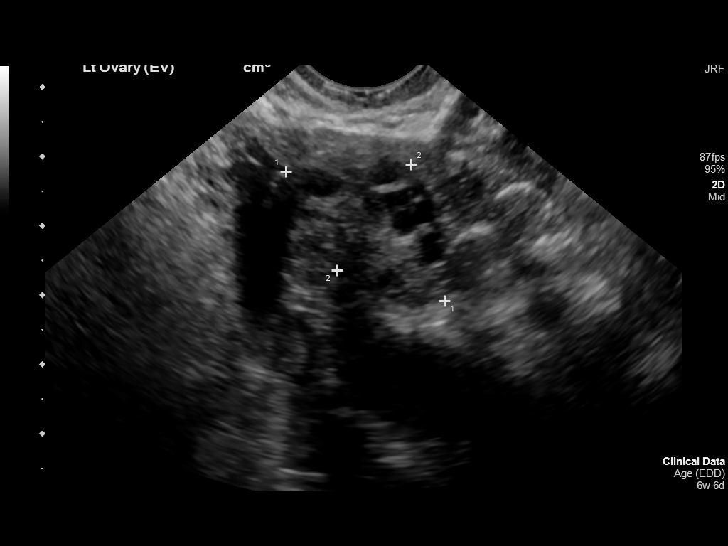
[im 131/131]
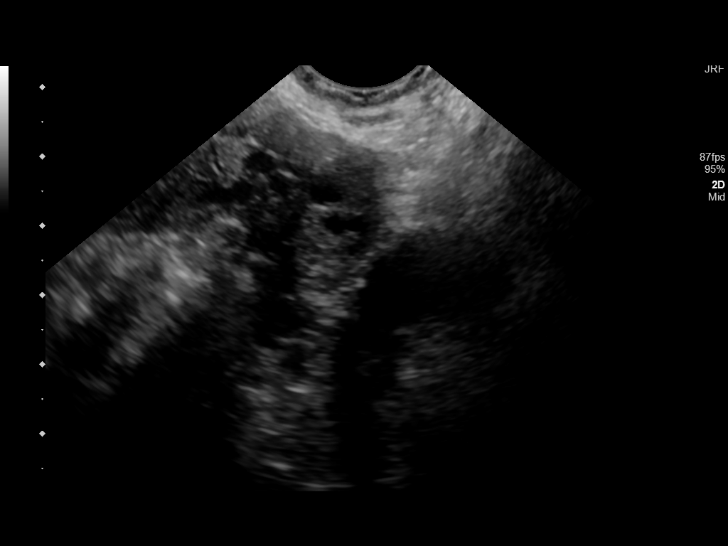

[14 of 28 positions shown; findings below may reference images not displayed]

FINDINGS: Intrauterine gestational sac: None

Maternal uterus/adnexae: Right ovary measures 3.5 x 1.7 x 2.2 cm and
contains a 1.7 cm hypoechoic region. The left ovary measures 3.0 x
1.9 x 2.0 cm and is normal in appearance. Trace fluid in the
endometrial canal is noted. Small nabothian cysts are identified.
IMPRESSION: 1. An IUP is no longer identified, consistent with spontaneous
abortion.
2. Small nabothian cysts.
3. Trace fluid in the endometrial canal is identified and not
unexpected given history.
4. Probable resolving corpus luteum cyst in the right ovary.

## 2020-11-19 ENCOUNTER — Encounter: Payer: Self-pay | Admitting: Obstetrics and Gynecology

## 2020-11-19 ENCOUNTER — Ambulatory Visit: Payer: Medicaid Other | Admitting: Obstetrics and Gynecology

## 2020-11-19 ENCOUNTER — Other Ambulatory Visit: Payer: Self-pay

## 2020-11-19 VITALS — BP 107/73 | HR 75 | Ht 66.0 in | Wt 184.8 lb

## 2020-11-19 DIAGNOSIS — Z3042 Encounter for surveillance of injectable contraceptive: Secondary | ICD-10-CM | POA: Diagnosis not present

## 2020-11-19 DIAGNOSIS — Z3009 Encounter for other general counseling and advice on contraception: Secondary | ICD-10-CM

## 2020-11-19 MED ORDER — MEDROXYPROGESTERONE ACETATE 150 MG/ML IM SUSP: 150.0000 mg | Freq: Once | INTRAMUSCULAR | Status: DC

## 2020-11-19 MED ORDER — MEDROXYPROGESTERONE ACETATE 150 MG/ML IM SUSP
150.0000 mg | Freq: Once | INTRAMUSCULAR | Status: AC
  Administered 2020-11-19: 09:00:00 150 mg via INTRAMUSCULAR

## 2020-11-19 NOTE — Addendum Note (Signed)
Addended by: Dorian Pod on: 11/19/2020 09:31 AM   Modules accepted: Orders

## 2020-11-19 NOTE — Progress Notes (Signed)
HPI:      [name] is a 30 y.o. [phone] who LMP was No LMP recorded.  Subjective:   She presents today 4 months postpartum.  She would like to discuss Depo-Provera versus IUD versus permanent sterilization.  She is certain that she does not want any more children.  But she does state that her sister had a tubal ligation and now wants another child.    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 She does not have any pertinent problems on file. She  has a past surgical history that includes No past surgeries; LEEP (N/A, 07/11/2018); and Intrauterine device (iud) insertion (N/A, 07/11/2018). Her family history includes Hypertension in her mother. She  reports that she has never smoked. She has never used smokeless tobacco. She reports that she does not drink alcohol and does not use drugs. She currently has no medications in their medication list. She has No Known Allergies.       Review of Systems:  Review of Systems  Constitutional: Denied constitutional symptoms, night sweats, recent illness, fatigue, fever, insomnia and weight loss.  Eyes: Denied eye symptoms, eye pain, photophobia, vision change and visual disturbance.  Ears/Nose/Throat/Neck: Denied ear, nose, throat or neck symptoms, hearing loss, nasal discharge, sinus congestion and sore throat.  Cardiovascular: Denied cardiovascular symptoms, arrhythmia, chest pain/pressure, edema, exercise intolerance, orthopnea and palpitations.  Respiratory: Denied pulmonary symptoms, asthma, pleuritic pain, productive sputum, cough, dyspnea and wheezing.  Gastrointestinal: Denied, gastro-esophageal reflux, melena, nausea and  vomiting.  Genitourinary: Denied genitourinary symptoms including symptomatic vaginal discharge, pelvic relaxation issues, and urinary complaints.  Musculoskeletal: Denied musculoskeletal symptoms, stiffness, swelling, muscle weakness and myalgia.  Dermatologic: Denied dermatology symptoms, rash and scar.  Neurologic: Denied neurology symptoms, dizziness, headache, neck pain and syncope.  Psychiatric: Denied psychiatric symptoms, anxiety and depression.  Endocrine: Denied endocrine symptoms including hot flashes and night sweats.   Meds:   No current outpatient medications on file prior to visit.   No current facility-administered medications on file prior to visit.       Upstream - 11/19/20 0851       Pregnancy Intention Screening   Does the patient want to become pregnant in the next year? No    Does the patient's partner want to become pregnant in the next year? No    Would the patient like to discuss contraceptive options today? No      Contraception Wrap Up   Current Method Hormonal Injection    End Method Hormonal Injection    Contraception Counseling Provided No            The pregnancy intention screening data noted above was reviewed. Potential methods of contraception were discussed. The patient elected to proceed with Hormonal Injection.     Objective:     Vitals:   11/19/20 0849  BP: 107/73  Pulse: 75   Filed Weights   11/19/20 0849  Weight: 184 lb 12.8 oz (83.8 kg)                Assessment:    [id number] Patient Active Problem List   Diagnosis Date Noted   Normal labor 07/18/2020   GBS (group B Streptococcus carrier), +  RV culture, currently pregnant 07/05/2020   Anemia of pregnancy in third trimester 05/15/2020   Supervision of normal pregnancy 02/08/2020   Recurrent pregnancy loss in pregnant patient in second trimester, antepartum 02/08/2020   History of loop electrical excision procedure (LEEP) 02/08/2020   Vaginal discharge 01/26/2018    Abnormal uterine bleeding (AUB) 01/26/2018   STD exposure 01/26/2018   Pap smear of cervix with ASCUS, cannot exclude HGSIL 05/08/2016   Chlamydia infection affecting pregnancy in first trimester 05/08/2016     1. Birth control counseling     Patient doing well postpartum.  Currently using Depo-Provera for birth control.   Plan:            1.  We have discussed multiple methods of birth control in detail.  Risk benefits of each reviewed.  Specifically have discussed IUD versus permanent sterilization.  Patient has not yet decided.  She would like 1 more shot of Depo today and at her next visit would like to again discuss the possibility of IUD.  Orders No orders of the defined types were placed in this encounter.   No orders of the defined types were placed in this encounter.     F/U  Return in about 3 months (around 02/19/2021). I spent 22 minutes involved in the care of this patient preparing to see the patient by obtaining and reviewing her medical history (including labs, imaging tests and prior procedures), documenting clinical information in the electronic health record (EHR), counseling and coordinating care plans, writing and sending prescriptions, ordering tests or procedures and directly communicating with the patient by discussing pertinent items from her history and physical exam as well as detailing my assessment and plan as noted above so that she has an informed understanding.  All of her questions were answered.  Elonda Husky, M.D. 11/19/2020 9:11 AM

## 2021-02-13 ENCOUNTER — Encounter: Payer: Self-pay | Admitting: Obstetrics and Gynecology

## 2021-02-13 ENCOUNTER — Ambulatory Visit (INDEPENDENT_AMBULATORY_CARE_PROVIDER_SITE_OTHER): Payer: Medicaid Other | Admitting: Obstetrics and Gynecology

## 2021-02-13 ENCOUNTER — Other Ambulatory Visit: Payer: Self-pay

## 2021-02-13 VITALS — BP 112/75 | HR 64 | Ht 66.0 in | Wt 195.8 lb

## 2021-02-13 DIAGNOSIS — Z3042 Encounter for surveillance of injectable contraceptive: Secondary | ICD-10-CM

## 2021-02-13 MED ORDER — MEDROXYPROGESTERONE ACETATE 150 MG/ML IM SUSP
150.0000 mg | Freq: Once | INTRAMUSCULAR | Status: AC
Start: 2021-02-13 — End: 2021-02-13
  Administered 2021-02-13: 150 mg via INTRAMUSCULAR

## 2021-02-13 NOTE — Progress Notes (Signed)
Last Depo-Provera: 11/19/20. Side Effects if any: none. Serum HCG indicated? na Depo-Provera 150 mg IM given by: BR. Next appointment due 2mo.

## 2021-02-13 NOTE — Progress Notes (Signed)
HPI:      [name] is a 30 y.o. [id number] who LMP was No LMP recorded (lmp unknown).  Subjective:   She presents today patient originally scheduled to discuss Depo versus IUD.  She states that she would like to just stay on Depo and does not want to have discussion today.  She is happy with Depo and has no issues.    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 She does not have any pertinent problems on file. She  has a past surgical history that includes No past surgeries; LEEP (N/A, 07/11/2018); and Intrauterine device (iud) insertion (N/A, 07/11/2018). Her family history includes Hypertension in her mother. She  reports that she has never smoked. She has never used smokeless tobacco. She reports that she does not drink alcohol and does not use drugs. She has a current medication list which includes the following prescription(s): medroxyprogesterone. She has No Known Allergies.       Review of Systems:  Review of Systems  Constitutional: Denied constitutional symptoms, night sweats, recent illness, fatigue, fever, insomnia and weight loss.  Eyes: Denied eye symptoms, eye pain, photophobia, vision change and visual disturbance.  Ears/Nose/Throat/Neck: Denied ear, nose, throat or neck symptoms, hearing loss, nasal discharge, sinus congestion and sore throat.  Cardiovascular: Denied cardiovascular symptoms, arrhythmia, chest pain/pressure, edema, exercise intolerance, orthopnea and palpitations.  Respiratory: Denied pulmonary symptoms, asthma, pleuritic pain, productive sputum, cough, dyspnea and wheezing.  Gastrointestinal: Denied, gastro-esophageal reflux, melena, nausea and  vomiting.  Genitourinary: Denied genitourinary symptoms including symptomatic vaginal discharge, pelvic relaxation issues, and urinary complaints.  Musculoskeletal: Denied musculoskeletal symptoms, stiffness, swelling, muscle weakness and myalgia.  Dermatologic: Denied dermatology symptoms, rash and scar.  Neurologic: Denied neurology symptoms, dizziness, headache, neck pain and syncope.  Psychiatric: Denied psychiatric symptoms, anxiety and depression.  Endocrine: Denied endocrine symptoms including hot flashes and night sweats.   Meds:   Current Outpatient Medications on File Prior to Visit  Medication Sig Dispense Refill   medroxyPROGESTERone (DEPO-PROVERA) 150 MG/ML injection Inject 150 mg into the muscle every 3 (three) months.     No current facility-administered medications on file prior to visit.      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 Encounter for management and injection  of depo-Provera        Plan:            1.  Patient not interested in discussion regarding Depo today. Orders No orders of the defined types were placed in this encounter.    Meds ordered  this encounter  Medications   medroxyPROGESTERone (DEPO-PROVERA) injection 150 mg      F/U  Return in about 3 months (around 05/15/2021).  Elonda Husky, M.D. 02/13/2021 9:05 AM

## 2021-05-13 ENCOUNTER — Encounter: Payer: Medicaid Other | Admitting: Obstetrics and Gynecology

## 2021-05-13 ENCOUNTER — Ambulatory Visit: Payer: Medicaid Other

## 2021-05-15 ENCOUNTER — Encounter: Payer: Medicaid Other | Admitting: Obstetrics and Gynecology

## 2021-05-15 NOTE — Progress Notes (Deleted)
°  Last Pap: 01/26/2018 Last Depo-Provera: 02/13/2021 Side Effects if any: none. Serum HCG indicated? na Depo-Provera 150 mg IM given by: Santiago Bumpers, CMA Next appointment due: March 9 - March 23

## 2021-06-02 ENCOUNTER — Other Ambulatory Visit: Payer: Self-pay

## 2021-06-02 ENCOUNTER — Ambulatory Visit (INDEPENDENT_AMBULATORY_CARE_PROVIDER_SITE_OTHER): Payer: Commercial Managed Care - PPO | Admitting: Obstetrics and Gynecology

## 2021-06-02 DIAGNOSIS — Z3042 Encounter for surveillance of injectable contraceptive: Secondary | ICD-10-CM

## 2021-06-02 LAB — POCT URINE PREGNANCY: Preg Test, Ur: NEGATIVE

## 2021-06-02 MED ORDER — MEDROXYPROGESTERONE ACETATE 150 MG/ML IM SUSP
150.0000 mg | Freq: Once | INTRAMUSCULAR | Status: AC
  Administered 2021-06-02: 150 mg via INTRAMUSCULAR

## 2021-06-02 MED ORDER — MEDROXYPROGESTERONE ACETATE 150 MG/ML IM SUSP: 150.0000 mg | INTRAMUSCULAR | 3 refills | Status: DC

## 2021-06-02 NOTE — Progress Notes (Signed)
Last Depo-Provera: 11/19/20. Side Effects if any: none. Serum HCG indicated?  Depo-Provera 150 mg IM given by: Santiago Bumpers, CMA Next appointment due 3 mo: March 27 - April 10

## 2021-08-18 ENCOUNTER — Ambulatory Visit: Payer: Commercial Managed Care - PPO

## 2021-08-21 NOTE — Progress Notes (Signed)
Date last pap: 02/05/18-due, to be scheduled. ?Last Depo-Provera: 06/02/21. ?Side Effects if any: n/a. ?Serum HCG indicated? N/a. ?Depo-Provera 150 mg IM given by: Douglass Rivers, CMA. ?Next appointment due 11/07/21-11/21/21. ? ?

## 2021-08-22 ENCOUNTER — Ambulatory Visit (INDEPENDENT_AMBULATORY_CARE_PROVIDER_SITE_OTHER): Payer: Commercial Managed Care - PPO | Admitting: Obstetrics and Gynecology

## 2021-08-22 DIAGNOSIS — Z3042 Encounter for surveillance of injectable contraceptive: Secondary | ICD-10-CM | POA: Diagnosis not present

## 2021-08-22 MED ORDER — MEDROXYPROGESTERONE ACETATE 150 MG/ML IM SUSP
150.0000 mg | Freq: Once | INTRAMUSCULAR | Status: AC
  Administered 2021-08-22: 150 mg via INTRAMUSCULAR

## 2021-11-07 ENCOUNTER — Ambulatory Visit: Payer: Commercial Managed Care - PPO

## 2021-11-07 NOTE — Progress Notes (Deleted)
Date last pap: 02/05/18-due, to be scheduled. Last Depo-Provera: 08/22/2021 Side Effects if any: n/a. Serum HCG indicated? N/a. Depo-Provera 150 mg IM given by: Beverely Pace CMA. Next appointment due 01/23/22-02/07/22.

## 2021-11-17 ENCOUNTER — Ambulatory Visit (INDEPENDENT_AMBULATORY_CARE_PROVIDER_SITE_OTHER): Payer: Commercial Managed Care - PPO | Admitting: Obstetrics and Gynecology

## 2021-11-17 DIAGNOSIS — Z3042 Encounter for surveillance of injectable contraceptive: Secondary | ICD-10-CM

## 2021-11-17 MED ORDER — MEDROXYPROGESTERONE ACETATE 150 MG/ML IM SUSP
150.0000 mg | Freq: Once | INTRAMUSCULAR | Status: AC
  Administered 2021-11-17: 150 mg via INTRAMUSCULAR

## 2021-11-17 NOTE — Progress Notes (Signed)
Date last pap: 01/26/2018. Last Depo-Provera: 08/22/21. Side Effects if any: none. Depo-Provera 150 mg IM given by: Roddie Mc, CMA. Next appointment due SEPT 11 - SEPT 25. Pt presents for routine depo injection, tolerated well.

## 2022-02-02 ENCOUNTER — Ambulatory Visit (INDEPENDENT_AMBULATORY_CARE_PROVIDER_SITE_OTHER): Payer: Commercial Managed Care - PPO | Admitting: Obstetrics and Gynecology

## 2022-02-02 VITALS — BP 110/66 | HR 67 | Resp 16 | Ht 67.0 in | Wt 192.7 lb

## 2022-02-02 DIAGNOSIS — Z3042 Encounter for surveillance of injectable contraceptive: Secondary | ICD-10-CM | POA: Diagnosis not present

## 2022-02-02 MED ORDER — MEDROXYPROGESTERONE ACETATE 150 MG/ML IM SUSP
150.0000 mg | Freq: Once | INTRAMUSCULAR | Status: AC
  Administered 2022-02-02: 150 mg via INTRAMUSCULAR

## 2022-02-02 NOTE — Patient Instructions (Signed)
Medroxyprogesterone Injection (Contraception) ?What is this medication? ?MEDROXYPROGESTERONE (me DROX ee proe JES te rone) prevents ovulation and pregnancy. It belongs to a group of medications called contraceptives. This medication is a progestin hormone. ?This medicine may be used for other purposes; ask your health care provider or pharmacist if you have questions. ?COMMON BRAND NAME(S): Depo-Provera, Depo-subQ Provera 104 ?What should I tell my care team before I take this medication? ?They need to know if you have any of these conditions: ?Asthma ?Blood clots ?Breast cancer or family history of breast cancer ?Depression ?Diabetes ?Eating disorder (anorexia nervosa) ?Heart attack ?High blood pressure ?HIV infection or AIDS ?If you often drink alcohol ?Kidney disease ?Liver disease ?Migraine headaches ?Osteoporosis, weak bones ?Seizures ?Stroke ?Tobacco smoker ?Vaginal bleeding ?An unusual or allergic reaction to medroxyprogesterone, other hormones, medications, foods, dyes, or preservatives ?Pregnant or trying to get pregnant ?Breast-feeding ?How should I use this medication? ?Depo-Provera CI contraceptive injection is given into a muscle. Depo-subQ Provera 104 injection is given under the skin. It is given in a hospital or clinic setting. The injection is usually given during the first 5 days after the start of a menstrual period or 6 weeks after delivery of a baby. ?A patient package insert for the product will be given with each prescription and refill. Be sure to read this information carefully each time. The sheet may change often. ?Talk to your care team about the use of this medication in children. Special care may be needed. These injections have been used in female children who have started having menstrual periods. ?Overdosage: If you think you have taken too much of this medicine contact a poison control center or emergency room at once. ?NOTE: This medicine is only for you. Do not share this medicine  with others. ?What if I miss a dose? ?Keep appointments for follow-up doses. You must get an injection once every 3 months. It is important not to miss your dose. Call your care team if you are unable to keep an appointment. ?What may interact with this medication? ?Antibiotics or medications for infections, especially rifampin and griseofulvin ?Antivirals for HIV or hepatitis ?Aprepitant ?Armodafinil ?Bexarotene ?Bosentan ?Medications for seizures like carbamazepine, felbamate, oxcarbazepine, phenytoin, phenobarbital, primidone, topiramate ?Mitotane ?Modafinil ?St. John's wort ?This list may not describe all possible interactions. Give your health care provider a list of all the medicines, herbs, non-prescription drugs, or dietary supplements you use. Also tell them if you smoke, drink alcohol, or use illegal drugs. Some items may interact with your medicine. ?What should I watch for while using this medication? ?This medication does not protect you against HIV infection (AIDS) or other sexually transmitted diseases. ?Use of this product may cause you to lose calcium from your bones. Loss of calcium may cause weak bones (osteoporosis). Only use this product for more than 2 years if other forms of birth control are not right for you. The longer you use this product for birth control the more likely you will be at risk for weak bones. Ask your care team how you can keep strong bones. ?You may have a change in bleeding pattern or irregular periods. Many females stop having periods while taking this medication. ?If you have received your injections on time, your chance of being pregnant is very low. If you think you may be pregnant, see your care team as soon as possible. ?Tell your care team if you want to get pregnant within the next year. The effect of this medication may last a   long time after you get your last injection. ?What side effects may I notice from receiving this medication? ?Side effects that you should  report to your care team as soon as possible: ?Allergic reactions--skin rash, itching, hives, swelling of the face, lips, tongue, or throat ?Blood clot--pain, swelling, or warmth in the leg, shortness of breath, chest pain ?Gallbladder problems--severe stomach pain, nausea, vomiting, fever ?Increase in blood pressure ?Liver injury--right upper belly pain, loss of appetite, nausea, light-colored stool, dark yellow or brown urine, yellowing skin or eyes, unusual weakness or fatigue ?New or worsening migraines or headaches ?Seizures ?Stroke--sudden numbness or weakness of the face, arm, or leg, trouble speaking, confusion, trouble walking, loss of balance or coordination, dizziness, severe headache, change in vision ?Unusual vaginal discharge, itching, or odor ?Worsening mood, feelings of depression ?Side effects that usually do not require medical attention (report to your care team if they continue or are bothersome): ?Breast pain or tenderness ?Dark patches of the skin on the face or other sun-exposed areas ?Irregular menstrual cycles or spotting ?Nausea ?Weight gain ?This list may not describe all possible side effects. Call your doctor for medical advice about side effects. You may report side effects to FDA at 1-800-FDA-1088. ?Where should I keep my medication? ?This injection is only given by a care team. It will not be stored at home. ?NOTE: This sheet is a summary. It may not cover all possible information. If you have questions about this medicine, talk to your doctor, pharmacist, or health care provider. ?? 2023 Elsevier/Gold Standard (2020-07-14 00:00:00) ? ?

## 2022-02-02 NOTE — Progress Notes (Signed)
Date last pap: 01/26/2018. Last Depo-Provera: 11/17/2021. Side Effects if any: Non. Serum HCG indicated? N/A. Depo-Provera 150 mg IM given by: Zania Kalisz B, CMA. Next appointment due: November 27 -  December 11.

## 2022-02-10 ENCOUNTER — Encounter: Payer: Commercial Managed Care - PPO | Admitting: Obstetrics and Gynecology

## 2022-02-11 ENCOUNTER — Encounter: Payer: Commercial Managed Care - PPO | Admitting: Obstetrics and Gynecology

## 2022-02-11 DIAGNOSIS — Z124 Encounter for screening for malignant neoplasm of cervix: Secondary | ICD-10-CM

## 2022-02-11 DIAGNOSIS — Z01419 Encounter for gynecological examination (general) (routine) without abnormal findings: Secondary | ICD-10-CM

## 2022-04-04 ENCOUNTER — Ambulatory Visit
Admission: EM | Admit: 2022-04-04 | Discharge: 2022-04-04 | Disposition: A | Discharge status: Home / Self Care | Payer: Commercial Managed Care - PPO | Attending: Physician Assistant | Admitting: Physician Assistant

## 2022-04-04 ENCOUNTER — Encounter: Payer: Self-pay | Admitting: Emergency Medicine

## 2022-04-04 DIAGNOSIS — U071 COVID-19: Secondary | ICD-10-CM | POA: Insufficient documentation

## 2022-04-04 DIAGNOSIS — R112 Nausea with vomiting, unspecified: Secondary | ICD-10-CM | POA: Diagnosis present

## 2022-04-04 DIAGNOSIS — R051 Acute cough: Secondary | ICD-10-CM | POA: Insufficient documentation

## 2022-04-04 DIAGNOSIS — B349 Viral infection, unspecified: Secondary | ICD-10-CM | POA: Diagnosis present

## 2022-04-04 LAB — RESP PANEL BY RT-PCR (FLU A&B, COVID) ARPGX2
Influenza A by PCR: NEGATIVE
Influenza B by PCR: NEGATIVE
SARS Coronavirus 2 by RT PCR: POSITIVE — AB

## 2022-04-04 MED ORDER — PSEUDOEPH-BROMPHEN-DM 30-2-10 MG/5ML PO SYRP: 10.0000 mL | ORAL_SOLUTION | Freq: Four times a day (QID) | ORAL | 0 refills | Status: AC | PRN

## 2022-04-04 MED ORDER — ONDANSETRON 4 MG PO TBDP: 4.0000 mg | ORAL_TABLET | Freq: Three times a day (TID) | ORAL | 0 refills | Status: DC | PRN

## 2022-04-04 NOTE — ED Provider Notes (Signed)
[facility]    CSN: [id number] Arrival date & time: 04/04/22  0820      History   Chief Complaint Chief Complaint  Patient presents with   Cough    HPI HARSIMRAN WESTMAN is a 31 y.o. female presenting for 2-day history of fatigue, headaches, cough, congestion, sneezing, nausea/vomiting and diarrhea.  Reports symptoms got worse yesterday.  Also reports low-grade fever.  She denies body aches, sore throat, chest pain or breathing difficulty, abdominal pain.  Denies any sick contacts.  Has taken over-the-counter cough medication for symptoms.  She says she is otherwise healthy without any chronic medical problems and does not take any routine medications.  No other complaints.  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, antepartum 02/08/2020   History of loop electrical excision procedure (LEEP) 02/08/2020   Vaginal discharge 01/26/2018   Abnormal uterine bleeding (AUB) 01/26/2018   STD exposure 01/26/2018   Pap smear of cervix with ASCUS, cannot exclude HGSIL 05/08/2016   Chlamydia infection affecting pregnancy in first trimester 05/08/2016    Past Surgical History:  Procedure Laterality Date   INTRAUTERINE DEVICE (IUD) INSERTION N/A 07/11/2018   Procedure: INTRAUTERINE DEVICE (IUD) INSERTION Lot # [id number] Exp date Apr 2022;  Surgeon: Linzie Collin, MD;  Location: [facility];  Service:  Gynecology;  Laterality: N/A;   LEEP N/A 07/11/2018   Procedure: LOOP ELECTROSURGICAL EXCISION PROCEDURE (LEEP);  Surgeon: Linzie Collin, MD;  Location: [facility];  Service: Gynecology;  Laterality: N/A;   NO PAST SURGERIES      OB History     Gravida  7   Para  3   Term  2   Preterm  1   AB  4   Living  2      SAB  3   IAB  1   Ectopic      Multiple  0   Live Births  2            Home Medications    Prior to Admission medications   Medication Sig Start Date End Date Taking? Authorizing Provider  brompheniramine-pseudoephedrine-DM 30-2-10 MG/5ML syrup Take 10 mLs by mouth 4 (four) times daily as needed for up to 7 days. 04/04/22 04/11/22 Yes Shirlee Latch, PA-C  medroxyPROGESTERone (DEPO-PROVERA) 150 MG/ML injection Inject 1 mL (150 mg total) into the muscle every 3 (three) months. 06/02/21  Yes Hildred Laser, MD  ondansetron (ZOFRAN-ODT) 4 MG disintegrating tablet Take 1 tablet (4 mg total) by mouth every 8 (eight) hours as needed for nausea or vomiting. 04/04/22  Yes Shirlee Latch, PA-C    Family History Family History  Problem Relation Age of Onset   Hypertension Mother    Cancer Neg Hx    Diabetes Neg Hx    Heart disease Neg Hx     Social History Social History  Tobacco Use   Smoking status: Never   Smokeless tobacco: Never  Vaping Use   Vaping Use: Never used  Substance Use Topics   Alcohol use: No   Drug use: No     Allergies   Patient has no known allergies.   Review of Systems Review of Systems  Constitutional:  Positive for fatigue. Negative for chills, diaphoresis and fever.  HENT:  Positive for congestion, rhinorrhea and sneezing. Negative for ear pain, sinus pressure, sinus pain and sore throat.   Respiratory:  Positive for cough. Negative for shortness of breath.   Gastrointestinal:  Positive for diarrhea, nausea and vomiting. Negative for abdominal pain.  Musculoskeletal:  Negative for arthralgias and myalgias.   Skin:  Negative for rash.  Neurological:  Positive for headaches. Negative for weakness.  Hematological:  Negative for adenopathy.     Physical Exam Triage Vital Signs ED Triage Vitals  Enc Vitals Group     BP      Pulse      Resp      Temp      Temp src      SpO2      Weight      Height      Head Circumference      Peak Flow      Pain Score      Pain Loc      Pain Edu?      Excl. in GC?    No data found.  Updated Vital Signs BP 114/77 (BP Location: Right Arm)   Pulse 76   Temp 99.4 F (37.4 C) (Oral)   Resp 16   Ht 5\' 7"  (1.702 m)   Wt 192 lb 10.9 oz (87.4 kg)   SpO2 96%   Breastfeeding No   BMI 30.18 kg/m     Physical Exam Vitals and nursing note reviewed.  Constitutional:      General: She is not in acute distress.    Appearance: Normal appearance. She is not ill-appearing or toxic-appearing.  HENT:     Head: Normocephalic and atraumatic.     Right Ear: Tympanic membrane, ear canal and external ear normal.     Left Ear: Tympanic membrane, ear canal and external ear normal.     Nose: Congestion present.     Mouth/Throat:     Mouth: Mucous membranes are moist.     Pharynx: Oropharynx is clear.  Eyes:     General: No scleral icterus.       Right eye: No discharge.        Left eye: No discharge.     Conjunctiva/sclera: Conjunctivae normal.  Cardiovascular:     Rate and Rhythm: Normal rate and regular rhythm.     Heart sounds: Normal heart sounds.  Pulmonary:     Effort: Pulmonary effort is normal. No respiratory distress.     Breath sounds: Normal breath sounds.  Musculoskeletal:     Cervical back: Neck supple.  Skin:    General: Skin is dry.  Neurological:     General: No focal deficit present.     Mental Status: She is alert. Mental status is at baseline.     Motor: No weakness.     Gait: Gait normal.  Psychiatric:        Mood and Affect: Mood normal.        Behavior: Behavior normal.        Thought Content: Thought content normal.       UC  Treatments / Results  Labs (all labs ordered are listed, but only abnormal results are displayed) Labs Reviewed  RESP PANEL BY RT-PCR (FLU A&B, COVID) ARPGX2 - Abnormal; Notable for the following components:      Result Value   SARS Coronavirus 2 by RT PCR POSITIVE (*)    All other components within normal limits    EKG   Radiology No results found.  Procedures Procedures (including critical care time)  Medications Ordered in UC Medications - No data to display  Initial Impression / Assessment and Plan / UC Course  I have reviewed the triage vital signs and the nursing notes.  Pertinent labs & imaging results that were available during my care of the patient were reviewed by me and considered in my medical decision making (see chart for details).   31 year old female presents for fatigue, headaches, cough, congestion, nausea/vomiting and diarrhea that began 2 days ago.  Also reports low-grade fever.  Temp currently 99.4 degrees.  Patient is overall well-appearing and in no acute distress.  On exam she does have nasal congestion.  Throat is clear.  Chest clear auscultation heart regular rate and rhythm.  Respiratory panel obtained.  Advised patient we will contact her with any positive results.  Advised if she has COVID she is isolate 5 days and wear mask x5 days.  Reviewed ED precautions related to COVID-19.  We will consider Tamiflu if positive for the flu.  Advised if she does not hear from [place], the testing is negative and she has another virus.  I did send Bromfed-DM and Zofran to pharmacy.  Supportive care advised.  Reviewed return precautions and work note given.  Positive COVID test.  Result relayed to patient.  Final Clinical Impressions(s) / UC Diagnoses   Final diagnoses:  Viral illness  Acute cough  Nausea and vomiting, unspecified vomiting type  COVID-19     Discharge Instructions      -We will call if your flu or COVID test are positive.  If  positive for COVID you will need to isolate 5 days from symptom onset and then wear mask for 5 days.   -If positive for the flu you will need to be out for a couple of days until you are fever free greater than 24 hours before returning to work.  I can send Tamiflu.  -If you do not hear from [place], those tests are negative and you likely have another virus which will take about a week to run its course. - Supportive care encouraged with increasing rest and fluids, cough medicine and nausea medication as needed, Flonase, nasal saline, Zicam, Tylenol or Motrin as needed for fever control.     ED Prescriptions     Medication Sig Dispense Auth. Provider   brompheniramine-pseudoephedrine-DM 30-2-10 MG/5ML syrup Take 10 mLs by mouth 4 (four) times daily as needed for up to 7 days. 150 mL Eusebio Friendly B, PA-C   ondansetron (ZOFRAN-ODT) 4 MG disintegrating tablet Take 1 tablet (4 mg total) by mouth every 8 (eight) hours as needed for nausea or vomiting. 12 tablet Gareth Morgan      PDMP not reviewed this encounter.   Shirlee Latch, PA-C 04/04/22 [phone]

## 2022-04-04 NOTE — Discharge Instructions (Signed)
-  We will call if your flu or COVID test are positive.  If positive for COVID you will need to isolate 5 days from symptom onset and then wear mask for 5 days.   -If positive for the flu you will need to be out for a couple of days until you are fever free greater than 24 hours before returning to work.  I can send Tamiflu.  -If you do not hear from [place], those tests are negative and you likely have another virus which will take about a week to run its course. - Supportive care encouraged with increasing rest and fluids, cough medicine and nausea medication as needed, Flonase, nasal saline, Zicam, Tylenol or Motrin as needed for fever control.

## 2022-04-04 NOTE — ED Triage Notes (Signed)
Pt c/o cough, sneezing, vomiting, diarrhea. Started about 2 days ago. She reports a low grade fever.

## 2022-05-04 ENCOUNTER — Ambulatory Visit: Payer: Commercial Managed Care - PPO

## 2022-05-04 ENCOUNTER — Ambulatory Visit (INDEPENDENT_AMBULATORY_CARE_PROVIDER_SITE_OTHER): Payer: Commercial Managed Care - PPO

## 2022-05-04 VITALS — BP 123/74 | HR 71 | Ht 66.0 in | Wt 188.0 lb

## 2022-05-04 DIAGNOSIS — Z3042 Encounter for surveillance of injectable contraceptive: Secondary | ICD-10-CM

## 2022-05-04 MED ORDER — MEDROXYPROGESTERONE ACETATE 150 MG/ML IM SUSP
150.0000 mg | Freq: Once | INTRAMUSCULAR | Status: AC
  Administered 2022-05-04: 150 mg via INTRAMUSCULAR

## 2022-05-04 NOTE — Progress Notes (Signed)
Date last pap: 09/14/20219. Last Depo-Provera: 09/11/20023. Side Effects if any: na. Serum HCG indicated? na. Depo-Provera 150 mg IM given by: Loney Laurence CMA. Next appointment due Feb 23 march 12.

## 2022-06-10 NOTE — Progress Notes (Signed)
This encounter was created in error - please disregard. This encounter was created in error - please disregard.

## 2022-07-23 ENCOUNTER — Ambulatory Visit: Payer: Medicaid Other

## 2022-07-24 ENCOUNTER — Ambulatory Visit (INDEPENDENT_AMBULATORY_CARE_PROVIDER_SITE_OTHER): Payer: Medicaid Other

## 2022-07-24 VITALS — BP 100/70 | Ht 66.0 in | Wt 201.0 lb

## 2022-07-24 DIAGNOSIS — Z3042 Encounter for surveillance of injectable contraceptive: Secondary | ICD-10-CM

## 2022-07-24 MED ORDER — MEDROXYPROGESTERONE ACETATE 150 MG/ML IM SUSY
150.0000 mg | PREFILLED_SYRINGE | Freq: Once | INTRAMUSCULAR | Status: AC
Start: 2022-07-24 — End: 2022-07-24
  Administered 2022-07-24: 150 mg via INTRAMUSCULAR

## 2022-07-24 NOTE — Patient Instructions (Signed)
Contraceptive Injection A contraceptive injection is a shot that prevents pregnancy. It is also called a birth control shot. The shot contains the hormone progestin, which prevents pregnancy by: Stopping the ovaries from releasing eggs. Thickening cervical mucus to prevent sperm from entering the cervix. Thinning the lining of the uterus to prevent a fertilized egg from attaching to the uterus. Contraceptive injections are given under the skin (subcutaneous) or into a muscle (intramuscular). For these shots to work, you must get one of them every 3 months (12-13 weeks) from a health care provider. Tell a health care provider about: Any allergies you have. All medicines you are taking, including vitamins, herbs, eye drops, creams, and over-the-counter medicines. Any blood disorders you have. Any medical conditions you have. Whether you are pregnant or may be pregnant. What are the risks? Generally, this is a safe procedure. However, problems may occur, including: Mood changes or depression. Loss of bone density (osteoporosis) after long-term use. Blood clots. This is rare. Higher risk of an egg being fertilized outside your uterus (ectopic pregnancy).This is rare. What happens before the procedure? Your health care provider may do a routine physical exam. You may have a test to make sure you are not pregnant. What happens during the procedure?  The area where the shot will be given will be cleaned and sanitized with alcohol. A needle will be inserted into a muscle in your upper arm or buttock, or into the skin of your thigh or abdomen. The needle will be attached to a syringe with the medicine inside of it. The medicine will be pushed through the syringe and injected into your body. A small bandage (dressing) may be placed over the injection site. What can I expect after the procedure? After the procedure, it is common to have: Soreness around the injection site for a couple of  days. Irregular menstrual bleeding. Weight gain. Breast tenderness. Headaches. Discomfort in your abdomen. Ask your health care provider whether you need to use an added method of birth control (backup contraception), such as a condom, sponge, or spermicide. If the first shot is given 1-7 days after the start of your last menstrual period, you will not need backup contraception. If the first shot is given at any other time during your menstrual cycle, you should avoid having sex. If you do have sex, you will need to use backup contraception for 7 days after you receive the shot. Follow these instructions at home: General instructions Take over-the-counter and prescription medicines only as told by your health care provider. Do not rub or massage the injection site. Track your menstrual periods so you will know if they become irregular. Always use a condom to protect against sexually transmitted infections (STIs). Make sure you schedule an appointment in time for your next shot and mark it on your calendar. You must get an injection every 3 months (12-13 weeks) to prevent pregnancy. Lifestyle Do not use any products that contain nicotine or tobacco. These products include cigarettes, chewing tobacco, and vaping devices, such as e-cigarettes. If you need help quitting, ask your health care provider. Eat foods that are high in calcium and vitamin D, such as milk, cheese, and salmon. Doing this may help with any loss in bone density caused by the contraceptive injection. Ask your health care provider for dietary recommendations. Contact a health care provider if you: Have nausea or vomiting. Have abnormal vaginal discharge or bleeding. Miss a menstrual period or think you might be pregnant. Experience mood changes  or depression. Feel dizzy or light-headed. Have leg pain. Get help right away if you: Have chest pain or cough up blood. Have shortness of breath. Have a severe headache that does  not go away. Have numbness in any part of your body. Have slurred speech or vision problems. Have vaginal bleeding that is abnormally heavy or does not stop, or you have severe pain in your abdomen. Have depression that does not get better with treatment. If you ever feel like you may hurt yourself or others, or have thoughts about taking your own life, get help right away. Go to your nearest emergency department or: Call your local emergency services (911 in the [place] Call a suicide crisis helpline, such as the [place] at (878)307-6724 or 988 in the [place]. This is open 24 hours a day in the [place] Text the Crisis Text Line at 256 695 4977 (in the [place].). Summary A contraceptive injection is a shot that prevents pregnancy. It is also called the birth control shot. The shot is given under the skin (subcutaneous) or into a muscle (intramuscular). After this procedure, it is common to have soreness around the injection site for a couple of days. To prevent pregnancy, the shot must be given by a health care provider every 3 months (12-13 weeks). After you have the shot, ask your health care provider whether you need to use an added method of birth control (backup contraception), such as a condom, sponge, or spermicide. This information is not intended to replace advice given to you by your health care provider. Make sure you discuss any questions you have with your health care provider. Document Revised: 12/04/2020 Document Reviewed: 11/20/2019 Elsevier Patient Education  [place].

## 2022-07-24 NOTE — Progress Notes (Signed)
Date last pap: 06/28/17. Last Depo-Provera: 06/02/21. Side Effects if any: no. Serum HCG indicated? no. Depo-Provera 150 mg IM given by: Quintella Baton cma. Next appointment due 10-09-22 to 10-23-22.

## 2022-08-12 MED ORDER — MEDROXYPROGESTERONE ACETATE 150 MG/ML IM SUSY
150.0000 mg | PREFILLED_SYRINGE | Freq: Once | INTRAMUSCULAR | Status: AC
  Administered 2022-07-24: 150 mg via INTRAMUSCULAR

## 2022-08-12 NOTE — Addendum Note (Signed)
Addended by: Quintella Baton D on: 08/12/2022 11:13 AM   Modules accepted: Orders

## 2022-09-02 ENCOUNTER — Encounter: Payer: Self-pay | Admitting: Obstetrics and Gynecology

## 2022-09-02 ENCOUNTER — Ambulatory Visit (INDEPENDENT_AMBULATORY_CARE_PROVIDER_SITE_OTHER): Payer: Commercial Managed Care - PPO | Admitting: Obstetrics and Gynecology

## 2022-09-02 ENCOUNTER — Other Ambulatory Visit (HOSPITAL_COMMUNITY)
Admission: RE | Admit: 2022-09-02 | Discharge: 2022-09-02 | Disposition: A | Discharge status: Home / Self Care | Payer: Commercial Managed Care - PPO | Source: Ambulatory Visit | Attending: Obstetrics and Gynecology | Admitting: Obstetrics and Gynecology

## 2022-09-02 VITALS — BP 109/71 | HR 62 | Ht 66.0 in | Wt 199.9 lb

## 2022-09-02 DIAGNOSIS — F410 Panic disorder [episodic paroxysmal anxiety] without agoraphobia: Secondary | ICD-10-CM

## 2022-09-02 DIAGNOSIS — F419 Anxiety disorder, unspecified: Secondary | ICD-10-CM

## 2022-09-02 DIAGNOSIS — Z124 Encounter for screening for malignant neoplasm of cervix: Secondary | ICD-10-CM | POA: Diagnosis present

## 2022-09-02 DIAGNOSIS — Z01419 Encounter for gynecological examination (general) (routine) without abnormal findings: Secondary | ICD-10-CM | POA: Diagnosis present

## 2022-09-02 MED ORDER — SERTRALINE HCL 50 MG PO TABS: 50.0000 mg | ORAL_TABLET | Freq: Every day | ORAL | 0 refills | Status: DC

## 2022-09-02 NOTE — Progress Notes (Signed)
Patients presents for annual exam today. She states doing well with Depo, started in June 2022. Concerns of anxiety lately, GAD score of 18 and PHQ score of 16. Due for pap smear, ordered. Annual labs are ordered. She states no other questions or concerns at this time.

## 2022-09-02 NOTE — Progress Notes (Signed)
HPI:      Tiffany Bowen is a 32 y.o. [phone] who LMP was No LMP recorded. Patient has had an injection.  Subjective:   She presents today for her annual examination.  She states that she has begun having worsening panic attacks.  She can recognize them and sometimes can stop them but not always. (Depression scoring testing high).  She says she also has anxiety every day and sometimes feels irritated. She is using Depo and would like to continue.    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 She does not have any pertinent problems on file. She  has a past surgical history that includes No past surgeries; LEEP (N/A, 07/11/2018); and Intrauterine device (iud) insertion (N/A, 07/11/2018). Her family history includes Hypertension in her mother. She  reports that she has never smoked. She has never used smokeless tobacco. She reports that she does not drink alcohol and does not use drugs. She has a current medication list which includes the following prescription(s): medroxyprogesterone and sertraline. She has No Known Allergies.       Review of Systems:  Review of Systems  Constitutional: Denied constitutional symptoms, night sweats, recent illness, fatigue, fever, insomnia and weight loss.  Eyes: Denied eye symptoms, eye pain, photophobia, vision change and visual disturbance.  Ears/Nose/Throat/Neck: Denied ear, nose, throat or neck symptoms, hearing loss, nasal discharge, sinus congestion and sore throat.  Cardiovascular: Denied cardiovascular symptoms, arrhythmia, chest pain/pressure, edema, exercise intolerance, orthopnea and palpitations.  Respiratory: Denied pulmonary symptoms,  asthma, pleuritic pain, productive sputum, cough, dyspnea and wheezing.  Gastrointestinal: Denied, gastro-esophageal reflux, melena, nausea and vomiting.  Genitourinary: Denied genitourinary symptoms including symptomatic vaginal discharge, pelvic relaxation issues, and urinary complaints.  Musculoskeletal: Denied musculoskeletal symptoms, stiffness, swelling, muscle weakness and myalgia.  Dermatologic: Denied dermatology symptoms, rash and scar.  Neurologic: Denied neurology symptoms, dizziness, headache, neck pain and syncope.  Psychiatric: See HPI for additional information.  Endocrine: Denied endocrine symptoms including hot flashes and night sweats.   Meds:   Current Outpatient Medications on File Prior to Visit  Medication Sig Dispense Refill   medroxyPROGESTERone (DEPO-PROVERA) 150 MG/ML injection Inject 1 mL (150 mg total) into the muscle every 3 (three) months. 1 mL 3   No current facility-administered medications on file prior to visit.     Objective:     Vitals:   09/02/22 0823  BP: 109/71  Pulse: 62    Filed Weights   09/02/22 0823  Weight: 199 lb 14.4 oz (90.7 kg)              Physical examination General NAD, Conversant  HEENT Atraumatic; Op clear with mmm.  Normo-cephalic. Pupils reactive. Anicteric sclerae  Thyroid/Neck Smooth without nodularity or enlargement. Normal ROM.  Neck Supple.  Skin No rashes, lesions or ulceration. Normal palpated skin turgor. No nodularity.  Breasts: No masses or discharge.  Symmetric.  No axillary adenopathy.  Lungs: Clear to auscultation.No rales or wheezes. Normal Respiratory effort, no retractions.  Heart: NSR.  No murmurs or rubs appreciated. No peripheral edema  Abdomen: Soft.  Non-tender.  No masses.  No HSM. No hernia  Extremities: Moves all appropriately.  Normal ROM for age. No lymphadenopathy.  Neuro: Oriented to PPT.  Normal mood. Normal affect.     Pelvic:   Vulva: Normal appearance.  No lesions.  Vagina: No  lesions or abnormalities noted.  Support: Normal pelvic support.  Urethra No masses tenderness or scarring.  Meatus Normal size without lesions or prolapse.  Cervix: Normal appearance.  No lesions.  Anus: Normal exam.  No lesions.  Perineum: Normal exam.  No lesions.        Bimanual   Uterus: Normal size.  Non-tender.  Mobile.  AV.  Adnexae: No masses.  Non-tender to palpation.  Cul-de-sac: Negative for abnormality.     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 Well woman exam with routine gynecological exam   2. Cervical cancer screening   3. Panic attacks   4. Anxiety     Normal exam  Patient seems to be having panic attacks and anxiety.  After discussing this in some detail she thinks that she would like to try something to try to decrease her anxiety levels.   Plan:            1.  Basic Screening Recommendations The basic screening recommendations for asymptomatic women were discussed with the patient during her visit.  The age-appropriate recommendations were discussed with her and the rational for the tests reviewed.  When I am informed by the patient that another primary care physician has previously obtained the age-appropriate tests and they are up-to-date, only outstanding tests are ordered and referrals given as necessary.  Abnormal  results of tests will be discussed with her when all of her results are completed.  Routine preventative health maintenance measures emphasized: Exercise/Diet/Weight control, Tobacco Warnings, Alcohol/Substance use risks and Stress Management Pap Co-test performed 2.  Patient would like to continue Depo -we have discussed the bone loss associated with long Depo use. 3.  Will begin Zoloft. Orders Orders Placed This Encounter  Procedures   CBC   Basic metabolic panel   TSH   Lipid panel   Hemoglobin A1c     Meds ordered this encounter  Medications   sertraline (ZOLOFT) 50 MG tablet    Sig: Take 1 tablet (50 mg total) by mouth daily.    Dispense:  90 tablet    Refill:  0          F/U  Return in about 2 months (around 11/02/2022).  Elonda Husky, M.D. 09/02/2022 9:00 AM

## 2022-09-03 LAB — LIPID PANEL
Chol/HDL Ratio: 3.5 ratio (ref 0.0–4.4)
Cholesterol, Total: 201 mg/dL — ABNORMAL HIGH (ref 100–199)
HDL: 58 mg/dL (ref >39)
LDL Chol Calc (NIH): 133 mg/dL — ABNORMAL HIGH (ref 0–99)
Triglycerides: 57 mg/dL (ref 0–149)
VLDL Cholesterol Cal: 10 mg/dL (ref 5–40)

## 2022-09-03 LAB — CBC
Hematocrit: 40.1 % (ref 34.0–46.6)
Hemoglobin: 12.8 g/dL (ref 11.1–15.9)
MCH: 27.8 pg (ref 26.6–33.0)
MCHC: 31.9 g/dL (ref 31.5–35.7)
MCV: 87 fL (ref 79–97)
Platelets: 265 10*3/uL (ref 150–450)
RBC: 4.6 x10E6/uL (ref 3.77–5.28)
RDW: 12 % (ref 11.7–15.4)
WBC: 7.5 10*3/uL (ref 3.4–10.8)

## 2022-09-03 LAB — BASIC METABOLIC PANEL
BUN/Creatinine Ratio: 9 (ref 9–23)
BUN: 8 mg/dL (ref 6–20)
CO2: 22 mmol/L (ref 20–29)
Calcium: 9.4 mg/dL (ref 8.7–10.2)
Chloride: 104 mmol/L (ref 96–106)
Creatinine, Ser: 0.85 mg/dL (ref 0.57–1.00)
Glucose: 90 mg/dL (ref 70–99)
Potassium: 4.2 mmol/L (ref 3.5–5.2)
Sodium: 140 mmol/L (ref 134–144)
eGFR: 93 mL/min/{1.73_m2} (ref >59)

## 2022-09-03 LAB — HEMOGLOBIN A1C
Est. average glucose Bld gHb Est-mCnc: 117 mg/dL
Hgb A1c MFr Bld: 5.7 % — ABNORMAL HIGH (ref 4.8–5.6)

## 2022-09-03 LAB — TSH: TSH: 0.666 u[IU]/mL (ref 0.450–4.500)

## 2022-09-04 LAB — CYTOLOGY - PAP
Comment: NEGATIVE
Diagnosis: NEGATIVE
High risk HPV: NEGATIVE

## 2022-10-12 ENCOUNTER — Ambulatory Visit (INDEPENDENT_AMBULATORY_CARE_PROVIDER_SITE_OTHER): Payer: Commercial Managed Care - PPO | Admitting: Physician Assistant

## 2022-10-12 ENCOUNTER — Encounter: Payer: Self-pay | Admitting: Physician Assistant

## 2022-10-12 VITALS — BP 118/70 | HR 78 | Temp 98.4°F | Ht 66.0 in | Wt 197.0 lb

## 2022-10-12 DIAGNOSIS — R7303 Prediabetes: Secondary | ICD-10-CM | POA: Insufficient documentation

## 2022-10-12 DIAGNOSIS — Z862 Personal history of diseases of the blood and blood-forming organs and certain disorders involving the immune mechanism: Secondary | ICD-10-CM | POA: Diagnosis not present

## 2022-10-12 DIAGNOSIS — F419 Anxiety disorder, unspecified: Secondary | ICD-10-CM | POA: Diagnosis not present

## 2022-10-12 DIAGNOSIS — E785 Hyperlipidemia, unspecified: Secondary | ICD-10-CM | POA: Diagnosis not present

## 2022-10-12 NOTE — Patient Instructions (Addendum)
-  It was a pleasure to see you today! Please review your visit summary for helpful information -I would encourage you to follow your care via MyChart where you can access lab results, notes, messages, and more -If you feel that we did a nice job today, please complete your after-visit survey and leave us a Google review! Your CMA today was Kieandra and your provider was Dan , PA-C, DMSc   

## 2022-10-12 NOTE — Progress Notes (Signed)
Date:  10/12/2022   Name:  Tiffany Bowen   DOB:  Oct 17, 1990   MRN:  [id number]   Chief Complaint: Establish Care, Hyperlipidemia, Anemia, and Prediabetes  HPI Tiffany Bowen is a delightful 32 year old female with a history of anemia new to our practice today to establish care and discuss newly diagnosed prediabetes and mild HLD based on routine labs performed through GYN 09/02/2022.  A1c was 5.7%.  LDL 133.  No prior comparison.    At that visit she was also started on low-dose sertraline for mild anxiety. She was prescribed 50 mg but said it made her nauseated so she was advised to take 1/2 tablet daily (25 mg) which she has been doing with good effect, wonders if this is okay to continue at this dose.   Regarding history of anemia, she is not currently anemic based on her recent CBC showing hemoglobin 12.8 and hematocrit 40.1.  She takes a multivitamin containing iron.  All routine care is completed through GYN and she is up-to-date on this.   Medication list has been reviewed and updated.  Current Meds  Medication Sig   medroxyPROGESTERone (DEPO-PROVERA) 150 MG/ML injection Inject 1 mL (150 mg total) into the muscle every 3 (three) months.   sertraline (ZOLOFT) 50 MG tablet Take 1 tablet (50 mg total) by mouth daily.   UNABLE TO FIND Med Name: black girl vitamins- for energy Vit D3     Review of Systems  Constitutional:  Negative for fatigue and fever.  Respiratory:  Negative for chest tightness and shortness of breath.   Cardiovascular:  Negative for chest pain and palpitations.  Gastrointestinal:  Negative for abdominal pain.    Patient Active Problem List   Diagnosis Date Noted   Prediabetes 10/12/2022   Mild hyperlipidemia 10/12/2022   History of anemia 10/12/2022   Mild anxiety 10/12/2022   History of loop electrical excision procedure (LEEP) 02/08/2020   Abnormal uterine bleeding (AUB) 01/26/2018    No Known Allergies  Immunization History  Administered Date(s)  Administered   Influenza,inj,Quad PF,6+ Mos 02/08/2020   Tdap 05/01/2020    Past Surgical History:  Procedure Laterality Date   INTRAUTERINE DEVICE (IUD) INSERTION N/A 07/11/2018   Procedure: INTRAUTERINE DEVICE (IUD) INSERTION Lot # [id number] Exp date Apr 2022;  Surgeon: Linzie Collin, MD;  Location: [facility];  Service: Gynecology;  Laterality: N/A;   LEEP N/A 07/11/2018   Procedure: LOOP ELECTROSURGICAL EXCISION PROCEDURE (LEEP);  Surgeon: Linzie Collin, MD;  Location: [facility];  Service: Gynecology;  Laterality: N/A;   NO PAST SURGERIES      Social History   Tobacco Use   Smoking status: Never   Smokeless tobacco: Never  Vaping Use   Vaping Use: Never used  Substance Use Topics   Alcohol use: No   Drug use: No    Family History  Problem Relation Age of Onset   Stroke Mother    Diabetes Mother    Hypertension Mother    Hypertension Sister    Diabetes Sister    Cancer Paternal Grandmother    Heart disease Neg Hx         10/12/2022    3:36 PM 09/02/2022    8:29 AM  GAD 7 : Generalized Anxiety Score  Nervous, Anxious, on Edge 3 3  Control/stop worrying 3 3  Worry too much - different things 3 3  Trouble relaxing 2 2  Restless 1 1  Easily annoyed or irritable 2 3  Afraid -  awful might happen 2 3  Total GAD 7 Score 16 18  Anxiety Difficulty Not difficult at all        10/12/2022    3:35 PM 09/02/2022    8:28 AM 08/19/2020    9:12 AM  Depression screen PHQ 2/9  Decreased Interest 2 2 1   Down, Depressed, Hopeless 1 1 2   PHQ - 2 Score 3 3 3   Altered sleeping 2 3 3   Tired, decreased energy 3 3 3   Change in appetite 2 3 3   Feeling bad or failure about yourself  2 2 3   Trouble concentrating 2 2 3   Moving slowly or fidgety/restless 0 0 0  Suicidal thoughts 0 0 0  PHQ-9 Score 14 16 18   Difficult doing work/chores Not difficult at all  Somewhat difficult    BP Readings from Last 3 Encounters:  10/12/22 118/70  09/02/22 109/71  07/24/22 100/70    Wt  Readings from Last 3 Encounters:  10/12/22 197 lb (89.4 kg)  09/02/22 199 lb 14.4 oz (90.7 kg)  07/24/22 201 lb (91.2 kg)    BP 118/70   Pulse 78   Temp 98.4 F (36.9 C) (Oral)   Ht 5\' 6"  (1.676 m)   Wt 197 lb (89.4 kg)   SpO2 97%   BMI 31.80 kg/m   Physical Exam Vitals and nursing note reviewed.  Constitutional:      Appearance: Normal appearance.  Cardiovascular:     Rate and Rhythm: Normal rate and regular rhythm.     Heart sounds: No murmur heard.    No friction rub. No gallop.  Pulmonary:     Effort: Pulmonary effort is normal.     Breath sounds: Normal breath sounds.  Abdominal:     General: There is no distension.  Musculoskeletal:        General: Normal range of motion.  Skin:    General: Skin is warm and dry.  Neurological:     Mental Status: She is alert and oriented to person, place, and time.     Gait: Gait is intact.  Psychiatric:        Mood and Affect: Mood and affect normal.     Recent Labs     Component Value Date/Time   NA 140 09/02/2022 0911   NA 134 (L) 11/01/2013 1651   K 4.2 09/02/2022 0911   K 3.5 11/01/2013 1651   CL 104 09/02/2022 0911   CL 107 11/01/2013 1651   CO2 22 09/02/2022 0911   CO2 23 11/01/2013 1651   GLUCOSE 90 09/02/2022 0911   GLUCOSE 91 12/14/2019 1035   GLUCOSE 89 11/01/2013 1651   BUN 8 09/02/2022 0911   BUN 7 11/01/2013 1651   CREATININE 0.85 09/02/2022 0911   CREATININE 0.77 11/01/2013 1651   CALCIUM 9.4 09/02/2022 0911   CALCIUM 9.1 11/01/2013 1651   PROT 7.6 12/14/2019 1035   PROT 7.7 11/01/2013 1651   ALBUMIN 4.0 12/14/2019 1035   ALBUMIN 3.7 11/01/2013 1651   AST 17 12/14/2019 1035   AST 26 11/01/2013 1651   ALT 19 12/14/2019 1035   ALT 27 11/01/2013 1651   ALKPHOS 35 (L) 12/14/2019 1035   ALKPHOS 56 11/01/2013 1651   BILITOT 0.9 12/14/2019 1035   BILITOT 0.4 11/01/2013 1651   GFRNONAA >60 12/14/2019 1035   GFRNONAA >60 11/01/2013 1651   GFRAA >60 12/14/2019 1035   GFRAA >60 11/01/2013 1651     Lab Results  Component Value Date   WBC  7.5 09/02/2022   HGB 12.8 09/02/2022   HCT 40.1 09/02/2022   MCV 87 09/02/2022   PLT 265 09/02/2022   Lab Results  Component Value Date   HGBA1C 5.7 (H) 09/02/2022   Lab Results  Component Value Date   CHOL 201 (H) 09/02/2022   HDL 58 09/02/2022   LDLCALC 133 (H) 09/02/2022   TRIG 57 09/02/2022   CHOLHDL 3.5 09/02/2022   Lab Results  Component Value Date   TSH 0.666 09/02/2022     Assessment and Plan:  1. Prediabetes Patient reassured A1c of 5.7% not concerning at this time, lifestyle modification would be perfectly adequate to control with periodic monitoring of A1c every 6 to 12 months.  Patient education attached to today's visit.   Recommend diet low in simple carbohydrates such as white starches (bread, pasta, rice) and refined sugar found in desserts and sweetened beverages including juice, sweet tea, and soda.   2. Mild hyperlipidemia Patient reassured LDL 133 not concerning at this time, no need for treatment.  ASCVD risk cannot be calculated due to age.  Patient advised that we would only consider adding medication if lipids significantly elevated after lifestyle modification.  I recommend reducing consumption of foods high in cholesterol especially animal fats. Lean proteins such as skinless chicken, [place], fish and nuts are encouraged. As always, regular physical activity is recommended for cholesterol metabolism.  3. History of anemia Patient is not anemic.  Encouraged to continue multivitamin containing iron.  4. Mild Anxiety Slightly improved over the last month since starting sertraline. Some healthcare anxiety from recent labs which patient was worried about - reassured there is really no need to worry.   Patient advised that as long as she is getting benefit from sertraline 25 mg, she may continue at this dose.  At any point if this loses effectiveness, she may increase to 50 mg daily as prescribed.  At this low  dose, probably no need to taper should she decide to stop the medication in the future.  --  Ultimately, these conditions are all mild and do not necessitate medication or close co-management.  Advised patient we will be here for any of her medical needs, but otherwise continue following with GYN with routine care and periodic monitoring of labs.  Return if symptoms worsen or fail to improve.   Partially dictated using Animal nutritionist. Any errors are unintentional.  Alvester Morin, PA-C, DMSc, Nutritionist [facility] [phone]

## 2022-10-16 ENCOUNTER — Ambulatory Visit: Payer: Medicaid Other

## 2022-10-21 ENCOUNTER — Ambulatory Visit (INDEPENDENT_AMBULATORY_CARE_PROVIDER_SITE_OTHER): Payer: Commercial Managed Care - PPO

## 2022-10-21 VITALS — BP 120/69 | Ht 66.0 in | Wt 196.0 lb

## 2022-10-21 DIAGNOSIS — Z3042 Encounter for surveillance of injectable contraceptive: Secondary | ICD-10-CM

## 2022-10-21 MED ORDER — MEDROXYPROGESTERONE ACETATE 150 MG/ML IM SUSP
150.0000 mg | Freq: Once | INTRAMUSCULAR | Status: AC
  Administered 2022-10-21: 150 mg via INTRAMUSCULAR

## 2022-10-21 NOTE — Progress Notes (Signed)
    NURSE VISIT NOTE  Subjective:    Patient ID: Tiffany Bowen, female    DOB: 1991/01/14, 32 y.o.   MRN: [id number]  HPI  Patient is a 32 y.o. [id number] female who presents for depo provera injection.   Objective:    There were no vitals taken for this visit.  Last Annual: 09/02/22. Last pap: 09/02/22. Last Depo-Provera: 07/24/22. Side Effects if any: none. Serum HCG indicated? No . Depo-Provera 150 mg IM given by: Cornelius Moras, CMA. Site: Left Deltoid  Lab Review  @THIS  VISIT ONLY@  Assessment:   1. Encounter for surveillance of injectable contraceptive      Plan:   Next appointment due between Aug 14th  and Aug 28th.    Cornelius Moras, CMA

## 2022-11-03 ENCOUNTER — Encounter: Payer: Self-pay | Admitting: Obstetrics and Gynecology

## 2022-11-03 ENCOUNTER — Telehealth: Payer: Commercial Managed Care - PPO | Admitting: Obstetrics and Gynecology

## 2022-11-03 DIAGNOSIS — F419 Anxiety disorder, unspecified: Secondary | ICD-10-CM

## 2022-11-03 DIAGNOSIS — F410 Panic disorder [episodic paroxysmal anxiety] without agoraphobia: Secondary | ICD-10-CM | POA: Diagnosis not present

## 2022-11-03 NOTE — Progress Notes (Signed)
Virtual Visit via Video Note  I connected with Tiffany Bowen on 11/03/22 at  7:35 AM EDT by video and verified that I was speaking with the correct person using two identifiers.    [name] is a 32 y.o. [phone] who LMP was No LMP recorded. Patient has had an injection. I discussed the limitations, risks, security and privacy concerns of performing an evaluation and management service by video and the availability of in person appointments. I also discussed with the patient that there may be a patient responsible charge related to this service. The patient expressed understanding and agreed to proceed.  Location of patient: Home  Patient gave explicit verbal consent for video visit:  YES  Location of provider:  [facility] office  Persons other than physician and patient involved in provider conference:  None   Subjective:   History of Present Illness:    She has a history of panic attacks and earlier this year she was noting that they had become worse.  They were also accompanied by worsening anxiety.  After discussion we began her on Zoloft.  She noted that after beginning the Zoloft she had significant improvement in her anxiety and panic attacks.  She states that others around her could also tell this dramatic difference.  She does state that over the last 2 weeks the effect of the Zoloft does not seem to be working as well.  She says that she is now having panic attacks again and accompanying anxiety.  She reports that nothing has changed that she can tell in her life or social situations to cause this.  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 She does not have any pertinent problems on file. She  has a past surgical history that includes  No past surgeries; LEEP (N/A, 07/11/2018); and Intrauterine device (iud) insertion (N/A, 07/11/2018). Her family history includes Cancer in her paternal grandmother; Diabetes in her mother and sister; Hypertension in her mother and sister; Stroke in her mother. She  reports that she has never smoked. She has never used smokeless tobacco. She reports that she does not drink alcohol and does not use drugs. She has a current medication list which includes the following prescription(s): medroxyprogesterone, sertraline, and UNABLE TO FIND. She has No Known Allergies.       Review of Systems:  Review of Systems  Constitutional: Denied constitutional symptoms, night sweats, recent illness, fatigue, fever, insomnia and weight loss.  Eyes: Denied eye symptoms, eye pain, photophobia, vision change and visual disturbance.  Ears/Nose/Throat/Neck: Denied ear, nose, throat or neck symptoms, hearing loss, nasal discharge, sinus congestion and sore throat.  Cardiovascular: Denied cardiovascular symptoms, arrhythmia, chest pain/pressure, edema, exercise intolerance, orthopnea and palpitations.  Respiratory: Denied pulmonary symptoms, asthma, pleuritic pain, productive sputum, cough, dyspnea and wheezing.  Gastrointestinal: Denied, gastro-esophageal reflux, melena, nausea and vomiting.  Genitourinary: Denied genitourinary symptoms including symptomatic vaginal discharge, pelvic relaxation issues, and urinary complaints.  Musculoskeletal: Denied musculoskeletal symptoms, stiffness, swelling, muscle weakness and myalgia.  Dermatologic: Denied dermatology symptoms, rash and scar.  Neurologic: Denied neurology symptoms, dizziness, headache, neck pain and syncope.  Psychiatric: See HPI for additional information.  Endocrine: Denied endocrine symptoms including hot flashes and night sweats.   Meds:  Current Outpatient Medications on File Prior to Visit  Medication Sig Dispense Refill   medroxyPROGESTERone  (DEPO-PROVERA) 150 MG/ML injection Inject 1 mL (150 mg total) into the muscle every 3 (three) months. 1 mL 3   sertraline (ZOLOFT) 50 MG tablet Take 1 tablet (50 mg total) by mouth daily. 90 tablet 0   UNABLE TO FIND Med Name: black girl vitamins- for energy Vit D3     No current facility-administered medications on file prior to visit.    Assessment:    [id number] Patient Active Problem List   Diagnosis Date Noted   Prediabetes 10/12/2022   Mild hyperlipidemia 10/12/2022   History of anemia 10/12/2022   Mild anxiety 10/12/2022   History of loop electrical excision procedure (LEEP) 02/08/2020   Abnormal uterine bleeding (AUB) 01/26/2018     1. Panic attacks   2. Anxiety     Initially much improved with Zoloft.  Patient likes being on this medication.  Recently they have started to worsen again.  Plan:            1.  Discussed self-control of panic attacks and recognizing signs and symptoms as well as triggers.  We have also discussed increasing the Zoloft dose since it seems to work for her.  Increase to 75 mg and see if she notes a difference in 2 weeks.  If not she will increase to 100 mg daily.  Orders No orders of the defined types were placed in this encounter.   No orders of the defined types were placed in this encounter.     F/U  Return in about 3 months (around 02/03/2023). I spent 21 minutes involved in the care of this patient preparing to see the patient by obtaining and reviewing her medical history (including labs, imaging tests and prior procedures), documenting clinical information in the electronic health record (EHR), counseling and coordinating care plans, writing and sending prescriptions, ordering tests or procedures and in direct communicating with the patient and medical staff discussing pertinent items from her history and physical exam.   Elonda Husky, M.D. 11/03/2022 8:13 AM

## 2022-11-25 ENCOUNTER — Other Ambulatory Visit: Payer: Self-pay | Admitting: Obstetrics and Gynecology

## 2022-11-25 DIAGNOSIS — F410 Panic disorder [episodic paroxysmal anxiety] without agoraphobia: Secondary | ICD-10-CM

## 2022-11-25 DIAGNOSIS — F419 Anxiety disorder, unspecified: Secondary | ICD-10-CM

## 2023-01-13 ENCOUNTER — Ambulatory Visit: Payer: Commercial Managed Care - PPO

## 2023-01-21 NOTE — Progress Notes (Signed)
    NURSE VISIT NOTE  Subjective:    Patient ID: JAIYDA DUNCKEL, female    DOB: 08-13-1990, 32 y.o.   MRN: [id number]  HPI  Patient is a 32 y.o. [id number] female who presents for depo provera injection.   Objective:    BP 121/82   Pulse 69   Ht 5\' 6"  (1.676 m)   Wt 187 lb (84.8 kg)   BMI 30.18 kg/m   Last Annual: 06/28/17. Last pap: 06/02/21. Last Depo-Provera: 07/24/2022. Side Effects if any: none. Serum HCG indicated? Yes . Depo-Provera 150 mg IM given by: Beverely Pace, CMA. Site: Right Deltoid    Assessment:   1. Encounter for management and injection of depo-Provera      Plan:   Next appointment due between Nov 15 and 29.    Loney Laurence, CMA de

## 2023-01-22 ENCOUNTER — Ambulatory Visit: Payer: Commercial Managed Care - PPO

## 2023-01-22 ENCOUNTER — Ambulatory Visit (INDEPENDENT_AMBULATORY_CARE_PROVIDER_SITE_OTHER): Payer: Commercial Managed Care - PPO | Admitting: Physician Assistant

## 2023-01-22 ENCOUNTER — Encounter: Payer: Self-pay | Admitting: Physician Assistant

## 2023-01-22 VITALS — BP 121/82 | HR 69 | Ht 66.0 in | Wt 187.0 lb

## 2023-01-22 VITALS — BP 110/78 | HR 69 | Ht 66.0 in | Wt 188.0 lb

## 2023-01-22 DIAGNOSIS — M67431 Ganglion, right wrist: Secondary | ICD-10-CM | POA: Diagnosis not present

## 2023-01-22 DIAGNOSIS — Z3042 Encounter for surveillance of injectable contraceptive: Secondary | ICD-10-CM

## 2023-01-22 MED ORDER — MEDROXYPROGESTERONE ACETATE 150 MG/ML IM SUSY
150.0000 mg | PREFILLED_SYRINGE | Freq: Once | INTRAMUSCULAR | Status: AC
  Administered 2023-01-22: 150 mg via INTRAMUSCULAR

## 2023-01-22 NOTE — Progress Notes (Signed)
Date:  01/22/2023   Name:  Tiffany Bowen   DOB:  10-30-1990   MRN:  [id number]   Chief Complaint: Cyst (X 1 week, Right wrist, same size, no changes in color, solid, not painful, never happened before )  HPI Tiffany Bowen presents today for evaluation of a new cyst on the right wrist which showed up about a week ago as described above.  It is not bothersome to her, she is really more curious as to what it is.  She has been using a compression sleeve on the right wrist since onset.  Endorses repetitive wrist motion at work.   Medication list has been reviewed and updated.  Current Meds  Medication Sig   medroxyPROGESTERone (DEPO-PROVERA) 150 MG/ML injection Inject 1 mL (150 mg total) into the muscle every 3 (three) months.   sertraline (ZOLOFT) 50 MG tablet Take 1 tablet by mouth once daily   UNABLE TO FIND Med Name: black girl vitamins- for energy Vit D3     Review of Systems  Constitutional:  Negative for fatigue and fever.  Respiratory:  Negative for chest tightness and shortness of breath.   Cardiovascular:  Negative for chest pain and palpitations.  Gastrointestinal:  Negative for abdominal pain.  Musculoskeletal:        Wrist cyst    Patient Active Problem List   Diagnosis Date Noted   Prediabetes 10/12/2022   Mild hyperlipidemia 10/12/2022   History of anemia 10/12/2022   Mild anxiety 10/12/2022   History of loop electrical excision procedure (LEEP) 02/08/2020   Abnormal uterine bleeding (AUB) 01/26/2018    No Known Allergies  Immunization History  Administered Date(s) Administered   Influenza,inj,Quad PF,6+ Mos 02/08/2020   Tdap 05/01/2020    Past Surgical History:  Procedure Laterality Date   INTRAUTERINE DEVICE (IUD) INSERTION N/A 07/11/2018   Procedure: INTRAUTERINE DEVICE (IUD) INSERTION Lot # [id number] Exp date Apr 2022;  Surgeon: Linzie Collin, MD;  Location: [facility];  Service: Gynecology;  Laterality: N/A;   LEEP N/A 07/11/2018   Procedure: LOOP  ELECTROSURGICAL EXCISION PROCEDURE (LEEP);  Surgeon: Linzie Collin, MD;  Location: [facility];  Service: Gynecology;  Laterality: N/A;   NO PAST SURGERIES      Social History   Tobacco Use   Smoking status: Never   Smokeless tobacco: Never  Vaping Use   Vaping status: Never Used  Substance Use Topics   Alcohol use: No   Drug use: No    Family History  Problem Relation Age of Onset   Stroke Mother    Diabetes Mother    Hypertension Mother    Hypertension Sister    Diabetes Sister    Cancer Paternal Grandmother    Heart disease Neg Hx         01/22/2023    9:07 AM 10/12/2022    3:36 PM 09/02/2022    8:29 AM  GAD 7 : Generalized Anxiety Score  Nervous, Anxious, on Edge 1 3 3   Control/stop worrying 1 3 3   Worry too much - different things 1 3 3   Trouble relaxing 1 2 2   Restless 1 1 1   Easily annoyed or irritable 1 2 3   Afraid - awful might happen 1 2 3   Total GAD 7 Score 7 16 18   Anxiety Difficulty Somewhat difficult Not difficult at all        01/22/2023    9:07 AM 10/12/2022    3:35 PM 09/02/2022    8:28 AM  Depression screen PHQ 2/9  Decreased Interest 1 2 2   Down, Depressed, Hopeless 1 1 1   PHQ - 2 Score 2 3 3   Altered sleeping 1 2 3   Tired, decreased energy 1 3 3   Change in appetite 1 2 3   Feeling bad or failure about yourself  0 2 2  Trouble concentrating 0 2 2  Moving slowly or fidgety/restless 1 0 0  Suicidal thoughts 0 0 0  PHQ-9 Score 6 14 16   Difficult doing work/chores Somewhat difficult Not difficult at all     BP Readings from Last 3 Encounters:  01/22/23 110/78  01/22/23 121/82  10/21/22 120/69    Wt Readings from Last 3 Encounters:  01/22/23 188 lb (85.3 kg)  01/22/23 187 lb (84.8 kg)  10/21/22 196 lb (88.9 kg)    BP 110/78   Pulse 69   Ht 5\' 6"  (1.676 m)   Wt 188 lb (85.3 kg)   SpO2 99%   BMI 30.34 kg/m   Physical Exam Vitals and nursing note reviewed.  Constitutional:      Appearance: Normal appearance.   Cardiovascular:     Rate and Rhythm: Normal rate.  Pulmonary:     Effort: Pulmonary effort is normal.  Abdominal:     General: There is no distension.  Musculoskeletal:        General: Normal range of motion.     Comments: 1 cm nontender mobile cyst at the right dorsal wrist without overlying skin changes. Full ROM at the wrist without pain.   Skin:    General: Skin is warm and dry.  Neurological:     Mental Status: She is alert and oriented to person, place, and time.     Gait: Gait is intact.  Psychiatric:        Mood and Affect: Mood and affect normal.     Recent Labs     Component Value Date/Time   NA 140 09/02/2022 0911   NA 134 (L) 11/01/2013 1651   K 4.2 09/02/2022 0911   K 3.5 11/01/2013 1651   CL 104 09/02/2022 0911   CL 107 11/01/2013 1651   CO2 22 09/02/2022 0911   CO2 23 11/01/2013 1651   GLUCOSE 90 09/02/2022 0911   GLUCOSE 91 12/14/2019 1035   GLUCOSE 89 11/01/2013 1651   BUN 8 09/02/2022 0911   BUN 7 11/01/2013 1651   CREATININE 0.85 09/02/2022 0911   CREATININE 0.77 11/01/2013 1651   CALCIUM 9.4 09/02/2022 0911   CALCIUM 9.1 11/01/2013 1651   PROT 7.6 12/14/2019 1035   PROT 7.7 11/01/2013 1651   ALBUMIN 4.0 12/14/2019 1035   ALBUMIN 3.7 11/01/2013 1651   AST 17 12/14/2019 1035   AST 26 11/01/2013 1651   ALT 19 12/14/2019 1035   ALT 27 11/01/2013 1651   ALKPHOS 35 (L) 12/14/2019 1035   ALKPHOS 56 11/01/2013 1651   BILITOT 0.9 12/14/2019 1035   BILITOT 0.4 11/01/2013 1651   GFRNONAA >60 12/14/2019 1035   GFRNONAA >60 11/01/2013 1651   GFRAA >60 12/14/2019 1035   GFRAA >60 11/01/2013 1651    Lab Results  Component Value Date   WBC 7.5 09/02/2022   HGB 12.8 09/02/2022   HCT 40.1 09/02/2022   MCV 87 09/02/2022   PLT 265 09/02/2022   Lab Results  Component Value Date   HGBA1C 5.7 (H) 09/02/2022   Lab Results  Component Value Date   CHOL 201 (H) 09/02/2022   HDL 58 09/02/2022   LDLCALC  133 (H) 09/02/2022   TRIG 57 09/02/2022    CHOLHDL 3.5 09/02/2022   Lab Results  Component Value Date   TSH 0.666 09/02/2022     Assessment and Plan:  1. Ganglion cyst of dorsum of right wrist Patient reassured of benign nature of ganglion cysts.  No intervention necessary, but if desired she could continue to use her compression sleeve especially at work, and might also consider OTC topical Voltaren gel which could speed the resolution. Patient education attached.    Return if symptoms worsen or fail to improve.    Alvester Morin, PA-C, DMSc, Nutritionist [facility] [phone]

## 2023-01-22 NOTE — Patient Instructions (Signed)
-  It was a pleasure to see you today! Please review your visit summary for helpful information -I would encourage you to follow your care via MyChart where you can access lab results, notes, messages, and more -If you feel that we did a nice job today, please complete your after-visit survey and leave us a Google review! Your CMA today was Kieandra and your provider was Dan Waddell, PA-C, DMSc   

## 2023-03-01 ENCOUNTER — Other Ambulatory Visit: Payer: Self-pay | Admitting: Obstetrics and Gynecology

## 2023-03-01 DIAGNOSIS — F419 Anxiety disorder, unspecified: Secondary | ICD-10-CM

## 2023-03-01 DIAGNOSIS — F410 Panic disorder [episodic paroxysmal anxiety] without agoraphobia: Secondary | ICD-10-CM

## 2023-03-01 NOTE — Telephone Encounter (Signed)
Needs appt prior to refill

## 2023-04-08 NOTE — Patient Instructions (Signed)

## 2023-04-09 ENCOUNTER — Ambulatory Visit (INDEPENDENT_AMBULATORY_CARE_PROVIDER_SITE_OTHER): Payer: Commercial Managed Care - PPO

## 2023-04-09 VITALS — BP 110/70 | HR 71 | Resp 16 | Ht 66.0 in | Wt 187.2 lb

## 2023-04-09 DIAGNOSIS — Z3042 Encounter for surveillance of injectable contraceptive: Secondary | ICD-10-CM | POA: Diagnosis not present

## 2023-04-09 MED ORDER — MEDROXYPROGESTERONE ACETATE 150 MG/ML IM SUSP
150.0000 mg | Freq: Once | INTRAMUSCULAR | Status: AC
  Administered 2023-04-09: 150 mg via INTRAMUSCULAR

## 2023-04-09 NOTE — Progress Notes (Signed)
    NURSE VISIT NOTE  Subjective:    Patient ID: Tiffany Bowen, female    DOB: [birth year], 32 y.o.   MRN: [id number]  HPI  Patient is a 32 y.o. [id number] female who presents for depo provera injection.   Objective:    BP 110/70   Pulse 71   Resp 16   Ht 5\' 6"  (1.676 m)   Wt 187 lb 3.2 oz (84.9 kg)   BMI 30.21 kg/m   Last Annual: 09/02/2027. Last pap: 09/02/2027. Last Depo-Provera: 01/22/2023. Side Effects if any: none. Serum HCG indicated? No . Depo-Provera 150 mg IM given by: Santiago Bumpers, CMA. Site: Left Deltoid  Lab Review   None   Assessment:   1. Encounter for surveillance of injectable contraceptive      Plan:   Next appointment due between January 31 and February 14.     Santiago Bumpers, CMA [facility]

## 2023-04-22 ENCOUNTER — Other Ambulatory Visit: Payer: Self-pay | Admitting: Obstetrics and Gynecology

## 2023-04-22 DIAGNOSIS — F419 Anxiety disorder, unspecified: Secondary | ICD-10-CM

## 2023-04-22 DIAGNOSIS — F410 Panic disorder [episodic paroxysmal anxiety] without agoraphobia: Secondary | ICD-10-CM

## 2023-04-26 NOTE — Telephone Encounter (Signed)
Mychart message sent to patient, once scheduled a refill sent be sent.

## 2023-04-27 ENCOUNTER — Other Ambulatory Visit (HOSPITAL_COMMUNITY): Payer: Self-pay

## 2023-04-27 ENCOUNTER — Other Ambulatory Visit: Payer: Self-pay

## 2023-04-27 MED ORDER — SERTRALINE HCL 50 MG PO TABS
50.0000 mg | ORAL_TABLET | Freq: Every day | ORAL | 0 refills | Status: DC
  Filled 2023-04-27: qty 30, 30d supply, fill #0

## 2023-04-28 ENCOUNTER — Telehealth (INDEPENDENT_AMBULATORY_CARE_PROVIDER_SITE_OTHER): Payer: Commercial Managed Care - PPO | Admitting: Obstetrics and Gynecology

## 2023-04-28 ENCOUNTER — Encounter: Payer: Self-pay | Admitting: Obstetrics and Gynecology

## 2023-04-28 DIAGNOSIS — F410 Panic disorder [episodic paroxysmal anxiety] without agoraphobia: Secondary | ICD-10-CM | POA: Diagnosis not present

## 2023-04-28 DIAGNOSIS — F419 Anxiety disorder, unspecified: Secondary | ICD-10-CM

## 2023-04-28 MED ORDER — SERTRALINE HCL 100 MG PO TABS: 100.0000 mg | ORAL_TABLET | Freq: Every day | ORAL | 1 refills | Status: DC

## 2023-04-28 NOTE — Progress Notes (Signed)
Virtual Visit via Video Note  I connected with Tiffany Bowen on 04/28/23 at  7:35 AM EST by video and verified that I was speaking with the correct person using two identifiers.    Tiffany Bowen is a 32 y.o. [phone] who LMP was No LMP recorded. Patient has had an injection. I discussed the limitations, risks, security and privacy concerns of performing an evaluation and management service by video and the availability of in person appointments. I also discussed with the patient that there may be a patient responsible charge related to this service. The patient expressed understanding and agreed to proceed.  Location of patient:  Home  Patient gave explicit verbal consent for video visit:  YES  Location of provider:  [facility] office  Persons other than physician and patient involved in provider conference:  None   Subjective:   History of Present Illness:    She presents today by video visit to discuss her use of Zoloft for anxiety and panic attacks.  She reports that since increasing her dose to 100 mg daily and has made a great deal of difference for her.  She is very happy on the medicine.  She has no other complaints and would like to continue it.  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 She does not have any pertinent problems on file. She  has a past surgical history that includes No past surgeries; LEEP (N/A, 07/11/2018); and Intrauterine device (iud) insertion (N/A, 07/11/2018). Her family history includes Cancer in her paternal grandmother; Diabetes in her mother and sister; Hypertension in her mother and sister; Stroke in her mother. She  reports that she has never smoked. She has never used smokeless tobacco. She reports that she does  not drink alcohol and does not use drugs. She has a current medication list which includes the following prescription(s): sertraline, medroxyprogesterone, sertraline, and UNABLE TO FIND. She has No Known Allergies.       Review of Systems:  Review of Systems  Constitutional: Denied constitutional symptoms, night sweats, recent illness, fatigue, fever, insomnia and weight loss.  Eyes: Denied eye symptoms, eye pain, photophobia, vision change and visual disturbance.  Ears/Nose/Throat/Neck: Denied ear, nose, throat or neck symptoms, hearing loss, nasal discharge, sinus congestion and sore throat.  Cardiovascular: Denied cardiovascular symptoms, arrhythmia, chest pain/pressure, edema, exercise intolerance, orthopnea and palpitations.  Respiratory: Denied pulmonary symptoms, asthma, pleuritic pain, productive sputum, cough, dyspnea and wheezing.  Gastrointestinal: Denied, gastro-esophageal reflux, melena, nausea and vomiting.  Genitourinary: Denied genitourinary symptoms including symptomatic vaginal discharge, pelvic relaxation issues, and urinary complaints.  Musculoskeletal: Denied musculoskeletal symptoms, stiffness, swelling, muscle weakness and myalgia.  Dermatologic: Denied dermatology symptoms, rash and scar.  Neurologic: Denied neurology symptoms, dizziness, headache, neck pain and syncope.  Psychiatric: Denied psychiatric symptoms, anxiety and depression.  Endocrine: Denied endocrine symptoms including hot flashes and night sweats.   Meds:   Current Outpatient Medications on File Prior to Visit  Medication Sig Dispense Refill   medroxyPROGESTERone (DEPO-PROVERA) 150 MG/ML injection Inject 1 mL (150 mg total) into the muscle every 3 (three) months. 1 mL 3   sertraline (ZOLOFT) 50 MG tablet Take 1 tablet (50 mg total) by mouth daily. 90 tablet 0   UNABLE TO FIND Med  Name: black girl vitamins- for energy Vit D3     No current facility-administered medications on file prior to visit.     Assessment:    [id number] Patient Active Problem List   Diagnosis Date Noted   Prediabetes 10/12/2022   Mild hyperlipidemia 10/12/2022   History of anemia 10/12/2022   Mild anxiety 10/12/2022   History of loop electrical excision procedure (LEEP) 02/08/2020   Abnormal uterine bleeding (AUB) 01/26/2018     1. Anxiety   2. Panic attacks     Patient states that her anxiety and panic attacks have mostly resolved using the Zoloft.  Plan:            1.  Continue Zoloft at 100 mg daily. Orders No orders of the defined types were placed in this encounter.    Meds ordered this encounter  Medications   sertraline (ZOLOFT) 100 MG tablet    Sig: Take 1 tablet (100 mg total) by mouth daily.    Dispense:  90 tablet    Refill:  1      F/U  Return for Annual Physical.   Elonda Husky, M.D. 04/28/2023 8:53 AM

## 2023-04-30 ENCOUNTER — Other Ambulatory Visit: Payer: Self-pay

## 2023-06-18 ENCOUNTER — Ambulatory Visit
Admission: EM | Admit: 2023-06-18 | Discharge: 2023-06-18 | Disposition: A | Discharge status: Home / Self Care | Payer: Commercial Managed Care - PPO | Attending: Emergency Medicine | Admitting: Emergency Medicine

## 2023-06-18 ENCOUNTER — Ambulatory Visit (INDEPENDENT_AMBULATORY_CARE_PROVIDER_SITE_OTHER): Payer: Commercial Managed Care - PPO

## 2023-06-18 ENCOUNTER — Encounter: Payer: Self-pay | Admitting: Emergency Medicine

## 2023-06-18 DIAGNOSIS — R051 Acute cough: Secondary | ICD-10-CM

## 2023-06-18 DIAGNOSIS — J09X2 Influenza due to identified novel influenza A virus with other respiratory manifestations: Secondary | ICD-10-CM | POA: Diagnosis present

## 2023-06-18 LAB — RESP PANEL BY RT-PCR (FLU A&B, COVID) ARPGX2
Influenza A by PCR: POSITIVE — AB
Influenza B by PCR: NEGATIVE
SARS Coronavirus 2 by RT PCR: NEGATIVE

## 2023-06-18 MED ORDER — IPRATROPIUM BROMIDE 0.06 % NA SOLN: 2.0000 | Freq: Four times a day (QID) | NASAL | 12 refills | Status: DC

## 2023-06-18 MED ORDER — OSELTAMIVIR PHOSPHATE 75 MG PO CAPS: 75.0000 mg | ORAL_CAPSULE | Freq: Two times a day (BID) | ORAL | 0 refills | Status: DC

## 2023-06-18 MED ORDER — BENZONATATE 100 MG PO CAPS: 200.0000 mg | ORAL_CAPSULE | Freq: Three times a day (TID) | ORAL | 0 refills | Status: DC

## 2023-06-18 MED ORDER — PROMETHAZINE-DM 6.25-15 MG/5ML PO SYRP: 5.0000 mL | ORAL_SOLUTION | Freq: Four times a day (QID) | ORAL | 0 refills | Status: DC | PRN

## 2023-06-18 NOTE — ED Provider Notes (Addendum)
[facility]    CSN: [id number] Arrival date & time: 06/18/23  0816      History   Chief Complaint Chief Complaint  Patient presents with   Cough    HPI Tiffany Bowen is a 33 y.o. female.   HPI  33 year old female past medical history significant for anemia, hyperlipidemia, anxiety, and prediabetes presents for evaluation of COVID/flulike symptoms that began yesterday.  These consist of a subjective fever, nonproductive cough, body aches, shortness of breath, wheezing, and chills.  She denies any runny nose, nasal congestion, ear pain, sore throat, GI symptoms, or headache.  No known sick contacts or recent travel.  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   cin 2 01/18/2014-    Hyperlipidemia    Post partum depression     Patient Active Problem List   Diagnosis Date Noted   Prediabetes 10/12/2022   Mild hyperlipidemia 10/12/2022   History of anemia 10/12/2022   Mild anxiety 10/12/2022   History of loop electrical excision procedure (LEEP) 02/08/2020   Abnormal uterine bleeding (AUB) 01/26/2018    Past Surgical History:  Procedure Laterality Date   INTRAUTERINE DEVICE (IUD) INSERTION N/A 07/11/2018   Procedure: INTRAUTERINE DEVICE (IUD) INSERTION Lot # [id number] Exp date Apr 2022;  Surgeon: Linzie Collin, MD;  Location: [facility];  Service: Gynecology;  Laterality: N/A;   LEEP N/A 07/11/2018   Procedure: LOOP ELECTROSURGICAL EXCISION PROCEDURE (LEEP);  Surgeon: Linzie Collin, MD;  Location: [facility];  Service: Gynecology;  Laterality: N/A;   NO PAST SURGERIES      OB History     Gravida  7   Para  3   Term  2   Preterm  1   AB  4   Living  2      SAB  3   IAB  1   Ectopic      Multiple  0   Live Births  2            Home Medications    Prior to Admission medications   Medication Sig Start Date End Date Taking?  Authorizing Provider  benzonatate (TESSALON) 100 MG capsule Take 2 capsules (200 mg total) by mouth every 8 (eight) hours. 06/18/23  Yes Becky Augusta, NP  ipratropium (ATROVENT) 0.06 % nasal spray Place 2 sprays into both nostrils 4 (four) times daily. 06/18/23  Yes Becky Augusta, NP  medroxyPROGESTERone (DEPO-PROVERA) 150 MG/ML injection Inject 1 mL (150 mg total) into the muscle every 3 (three) months. 06/02/21  Yes Hildred Laser, MD  oseltamivir (TAMIFLU) 75 MG capsule Take 1 capsule (75 mg total) by mouth every 12 (twelve) hours. 06/18/23  Yes Becky Augusta, NP  promethazine-dextromethorphan (PROMETHAZINE-DM) 6.25-15 MG/5ML syrup Take 5 mLs by mouth 4 (four) times daily as needed. 06/18/23  Yes Becky Augusta, NP  sertraline (ZOLOFT) 100 MG tablet Take 1 tablet (100 mg total) by mouth daily. 04/28/23 10/25/23 Yes Linzie Collin, MD  sertraline (ZOLOFT) 50 MG tablet Take 1 tablet (50 mg total) by mouth daily. 04/27/23  Yes Linzie Collin, MD  UNABLE TO FIND Med Name: black girl vitamins- for energy Vit D3   Yes [provider]    Family History Family History  Problem Relation Age of Onset   Stroke Mother    Diabetes Mother    Hypertension Mother  Hypertension Sister    Diabetes Sister    Cancer Paternal Grandmother    Heart disease Neg Hx     Social History Social History   Tobacco Use   Smoking status: Never   Smokeless tobacco: Never  Vaping Use   Vaping status: Never Used  Substance Use Topics   Alcohol use: No   Drug use: No     Allergies   Patient has no known allergies.   Review of Systems Review of Systems  Constitutional:  Positive for chills and fever.  HENT:  Negative for congestion, ear pain, rhinorrhea and sore throat.   Respiratory:  Positive for cough, shortness of breath and wheezing.   Musculoskeletal:  Positive for arthralgias and myalgias.  Neurological:  Negative for headaches.     Physical Exam Triage Vital Signs ED Triage Vitals   Encounter Vitals Group     BP      Systolic BP Percentile      Diastolic BP Percentile      Pulse      Resp      Temp      Temp src      SpO2      Weight      Height      Head Circumference      Peak Flow      Pain Score      Pain Loc      Pain Education      Exclude from Growth Chart    No data found.  Updated Vital Signs BP 113/74 (BP Location: Left Arm)   Pulse 97   Temp 99.6 F (37.6 C) (Oral)   Ht 5\' 6"  (1.676 m)   Wt 184 lb (83.5 kg)   LMP 06/11/2023   SpO2 94%   BMI 29.70 kg/m   Visual Acuity Right Eye Distance:   Left Eye Distance:   Bilateral Distance:    Right Eye Near:   Left Eye Near:    Bilateral Near:     Physical Exam Vitals and nursing note reviewed.  Constitutional:      Appearance: Normal appearance. She is not ill-appearing.  HENT:     Head: Normocephalic and atraumatic.     Right Ear: Tympanic membrane, ear canal and external ear normal. There is no impacted cerumen.     Left Ear: Tympanic membrane, ear canal and external ear normal. There is no impacted cerumen.     Nose: Congestion and rhinorrhea present.     Comments: Nasal mucosa is erythematous and edematous with copious clear discharge in both nares.    Mouth/Throat:     Mouth: Mucous membranes are moist.     Pharynx: Oropharynx is clear. Posterior oropharyngeal erythema present. No oropharyngeal exudate.     Comments: Erythema to the posterior oropharynx with clear postnasal drip. Cardiovascular:     Rate and Rhythm: Normal rate and regular rhythm.     Pulses: Normal pulses.     Heart sounds: Normal heart sounds. No murmur heard.    No friction rub. No gallop.  Pulmonary:     Effort: Pulmonary effort is normal.     Breath sounds: Normal breath sounds. No wheezing, rhonchi or rales.  Musculoskeletal:     Cervical back: Normal range of motion and neck supple. No tenderness.  Lymphadenopathy:     Cervical: No cervical adenopathy.  Skin:    General: Skin is warm and dry.      Capillary Refill: Capillary refill takes less  than 2 seconds.     Findings: No erythema or rash.  Neurological:     General: No focal deficit present.     Mental Status: She is alert and oriented to person, place, and time.      UC Treatments / Results  Labs (all labs ordered are listed, but only abnormal results are displayed) Labs Reviewed  RESP PANEL BY RT-PCR (FLU A&B, COVID) ARPGX2 - Abnormal; Notable for the following components:      Result Value   Influenza A by PCR POSITIVE (*)    All other components within normal limits    EKG   Radiology DG Chest 2 View Result Date: 06/18/2023 CLINICAL DATA:  Cough, chest pain EXAM: CHEST - 2 VIEW COMPARISON:  None Available. FINDINGS: Lungs are clear.  No pneumothorax. Heart size and mediastinal contours are within normal limits. No effusion. Visualized bones unremarkable. IMPRESSION: No acute cardiopulmonary disease. Electronically Signed   By: Corlis Leak M.D.   On: 06/18/2023 09:10    Procedures Procedures (including critical care time)  Medications Ordered in UC Medications - No data to display  Initial Impression / Assessment and Plan / UC Course  I have reviewed the triage vital signs and the nursing notes.  Pertinent labs & imaging results that were available during my care of the patient were reviewed by me and considered in my medical decision making (see chart for details).   Patient is a nontoxic-appearing 33 year old female presenting for evaluation of COVID/flulike symptoms that started yesterday as outlined HPI above.  In the exam room she is in no acute distress she is able to speak in full sentence without dyspnea or tachypnea.  Temperature is mildly elevated at 99.6 but no definitive fever.  Oxygen saturation is soft at 94%.  She indicates that her cough is nonproductive but she states that she feels short of breath and has had some wheezing.  Chest auscultation reveals clear lung sounds in all fields however.  Her  physical exam also reveals inflammation of her upper respiratory tract as evidenced by inflamed nasal mucosa with clear rhinorrhea.  She also has erythema to the posterior oropharynx with clear postnasal drip.  Differential diagnosis include COVID, influenza, viral respiratory illness, atypical pneumonia.  I will order a COVID and flu PCR along with a chest x-ray to evaluate for COVID, influenza, and pneumonia.  Chest x-ray independently reviewed and evaluated by me.  Impression: Lung fields are well aerated without evidence of infiltrate or effusion.  Cardiomediastinal silhouette appears normal.  Radiology overread is pending. Radiology impression states no acute cardiopulmonary disease.  Respiratory panel is positive for influenza A.  I will discharge patient on the diagnosis of influenza A and start her on Tamiflu 75 mg twice daily for 5 days.  Tylenol and ibuprofen as needed for fever or bodyaches.  Atrovent nasal spray to open nasal congestion along Tessalon Perles and Promethazine DM cough syrup for cough and congestion.   Final Clinical Impressions(s) / UC Diagnoses   Final diagnoses:  Acute cough  Influenza due to identified novel influenza A virus with other respiratory manifestations     Discharge Instructions      Take the Tamiflu twice daily for 5 days for treatment of influenza.  Use over-the-counter Tylenol and/or ibuprofen according the package instructions as needed for any fever or bodyaches.  Use the Atrovent nasal spray, 2 squirts up each nostril every 6 hours, as needed for nasal congestion and runny nose.  Use over-the-counter Delsym,  Zarbee's, or Robitussin during the day as needed for cough.  Use the Tessalon Perles every 8 hours as needed for cough.  Taken with a small sip of water.  You may experience some numbness to your tongue or metallic taste in your mouth, this is normal.  Use the Promethazine DM cough syrup at bedtime as will make you drowsy but it  should help dry up your postnasal drip and aid you in sleep and cough relief.  Return for reevaluation, or see your primary care provider, for new or worsening symptoms.      ED Prescriptions     Medication Sig Dispense Auth. Provider   benzonatate (TESSALON) 100 MG capsule Take 2 capsules (200 mg total) by mouth every 8 (eight) hours. 21 capsule Becky Augusta, NP   ipratropium (ATROVENT) 0.06 % nasal spray Place 2 sprays into both nostrils 4 (four) times daily. 15 mL Becky Augusta, NP   promethazine-dextromethorphan (PROMETHAZINE-DM) 6.25-15 MG/5ML syrup Take 5 mLs by mouth 4 (four) times daily as needed. 118 mL Becky Augusta, NP   oseltamivir (TAMIFLU) 75 MG capsule Take 1 capsule (75 mg total) by mouth every 12 (twelve) hours. 10 capsule Becky Augusta, NP      PDMP not reviewed this encounter.   Becky Augusta, NP 06/18/23 0913    Becky Augusta, NP 06/18/23 [id number]

## 2023-06-18 NOTE — Discharge Instructions (Addendum)
Take the Tamiflu twice daily for 5 days for treatment of influenza.  Use over-the-counter Tylenol and/or ibuprofen according the package instructions as needed for any fever or bodyaches.  Use the Atrovent nasal spray, 2 squirts up each nostril every 6 hours, as needed for nasal congestion and runny nose.  Use over-the-counter Delsym, Zarbee's, or Robitussin during the day as needed for cough.  Use the Tessalon Perles every 8 hours as needed for cough.  Taken with a small sip of water.  You may experience some numbness to your tongue or metallic taste in your mouth, this is normal.  Use the Promethazine DM cough syrup at bedtime as will make you drowsy but it should help dry up your postnasal drip and aid you in sleep and cough relief.  Return for reevaluation, or see your primary care provider, for new or worsening symptoms.

## 2023-06-18 NOTE — ED Triage Notes (Signed)
Pt is with her mother   Pt c/o cough x1day chest pain when coughing.  Pt denies any chest or nasal congestion.  Pt states that she is having body chills   Pt states that her chest hurts whenever she talks louds, laughs, or coughs.  Pt states that the pain is midline and goes from jaw line to sternum.

## 2023-06-21 ENCOUNTER — Encounter (INDEPENDENT_AMBULATORY_CARE_PROVIDER_SITE_OTHER): Payer: Commercial Managed Care - PPO

## 2023-06-21 DIAGNOSIS — J111 Influenza due to unidentified influenza virus with other respiratory manifestations: Secondary | ICD-10-CM | POA: Diagnosis not present

## 2023-06-21 NOTE — Telephone Encounter (Signed)
Please review. VV?  KP

## 2023-06-22 ENCOUNTER — Other Ambulatory Visit: Payer: Self-pay | Admitting: Physician Assistant

## 2023-06-22 MED ORDER — GUAIFENESIN-CODEINE 100-10 MG/5ML PO SOLN: 5.0000 mL | Freq: Three times a day (TID) | ORAL | 0 refills | Status: DC | PRN

## 2023-06-22 NOTE — Telephone Encounter (Signed)
Please see the MyChart message reply(ies) for my assessment and plan.    This patient gave consent for this Medical Advice Message and is aware that it may result in a bill to [employer], as well as the possibility of receiving a bill for a co-payment or deductible. They are an established patient, but are not seeking medical advice exclusively about a problem treated during an in person or video visit in the last seven days. I did not recommend an in person or video visit within seven days of my reply.    I spent a total of 10 minutes cumulative time within 7 days through [employer].  Allen Norris, PA

## 2023-07-02 ENCOUNTER — Ambulatory Visit (INDEPENDENT_AMBULATORY_CARE_PROVIDER_SITE_OTHER): Payer: Commercial Managed Care - PPO

## 2023-07-02 VITALS — BP 107/58 | HR 81 | Wt 182.2 lb

## 2023-07-02 DIAGNOSIS — Z3042 Encounter for surveillance of injectable contraceptive: Secondary | ICD-10-CM

## 2023-07-02 MED ORDER — MEDROXYPROGESTERONE ACETATE 150 MG/ML IM SUSY
150.0000 mg | PREFILLED_SYRINGE | Freq: Once | INTRAMUSCULAR | Status: AC
  Administered 2023-07-02: 150 mg via INTRAMUSCULAR

## 2023-07-02 NOTE — Progress Notes (Signed)
    NURSE VISIT NOTE  Subjective:    Patient ID: Tiffany Bowen, female    DOB: 1990/09/06, 33 y.o.   MRN: [id number]  HPI  Patient is a 33 y.o. [id number] female who presents for depo provera  injection.   Objective:    BP (!) 107/58 (BP Location: Right Arm, Patient Position: Sitting, Cuff Size: Normal)   Pulse 81   Wt 182 lb 3.2 oz (82.6 kg)   LMP 06/11/2023   BMI 29.41 kg/m   Last Annual: 09/02/2022. Last pap: 09/02/2022. Last Depo-Provera : 04/09/2023. Side Effects if any: none. Serum HCG indicated? No . Depo-Provera  150 mg IM given by: Burnard Ro, CMA. Site: Left Deltoid    Assessment:   1. Encounter for surveillance of injectable contraceptive      Plan:   Next appointment due between April 25th and May 9th.    Burnard LITTIE Ro, CMA

## 2023-09-24 ENCOUNTER — Ambulatory Visit (INDEPENDENT_AMBULATORY_CARE_PROVIDER_SITE_OTHER): Payer: Commercial Managed Care - PPO

## 2023-09-24 VITALS — BP 108/71 | HR 76 | Ht 66.0 in | Wt 186.0 lb

## 2023-09-24 DIAGNOSIS — Z3042 Encounter for surveillance of injectable contraceptive: Secondary | ICD-10-CM | POA: Diagnosis not present

## 2023-09-24 MED ORDER — MEDROXYPROGESTERONE ACETATE 150 MG/ML IM SUSP
150.0000 mg | Freq: Once | INTRAMUSCULAR | Status: AC
  Administered 2023-09-24: 150 mg via INTRAMUSCULAR

## 2023-09-24 NOTE — Patient Instructions (Signed)

## 2023-09-24 NOTE — Progress Notes (Signed)
    NURSE VISIT NOTE  Subjective:    Patient ID: Tiffany Bowen, female    DOB: September 10, 1990, 33 y.o.   MRN: [id number]  HPI  Patient is a 33 y.o. [id number] female who presents for depo provera  injection.   Objective:    BP 108/71   Pulse 76   Ht 5\' 6"  (1.676 m)   Wt 186 lb (84.4 kg)   BMI 30.02 kg/m   Last Annual: 09/02/22. Last pap: 09/02/22. Last Depo-Provera : 2/725. Side Effects if any: none. Serum HCG indicated? No . Depo-Provera  150 mg IM given by: Venetta Gill, CMA. Site: Left Deltoid ( patient requested)  Lab Review    Assessment:   1. Encounter for surveillance of injectable contraceptive      Plan:   Next appointment due between 7/18 and 12/24/23.    Inga Manges, CMA

## 2023-10-19 NOTE — Telephone Encounter (Signed)
 Please review and advise.   JM

## 2023-12-17 ENCOUNTER — Ambulatory Visit (INDEPENDENT_AMBULATORY_CARE_PROVIDER_SITE_OTHER)

## 2023-12-17 VITALS — BP 108/70 | HR 67 | Ht 66.0 in | Wt 185.9 lb

## 2023-12-17 DIAGNOSIS — Z3042 Encounter for surveillance of injectable contraceptive: Secondary | ICD-10-CM

## 2023-12-17 MED ORDER — MEDROXYPROGESTERONE ACETATE 150 MG/ML IM SUSP
150.0000 mg | Freq: Once | INTRAMUSCULAR | Status: AC
  Administered 2023-12-17: 150 mg via INTRAMUSCULAR

## 2023-12-17 NOTE — Patient Instructions (Signed)
 Birth Control Shot: What to Expect A birth control shot prevents pregnancy. A birth control shot is put in (injected) into the skin or a muscle. The shot contains the hormone progestin. Hormones are chemicals that affect how the body works. Progestin prevents pregnancy because it: Stops the ovaries from releasing eggs. Makes cervical mucus thicker. This prevents sperm from getting into the cervix. The cervix is the lowest part of the uterus. Thins the lining of the uterus to prevent a fertilized egg from attaching to the uterus. Tell a health care provider about: Any allergies you have. All medicines you take. These include vitamins, herbs, eye drops, and creams. Any bleeding problems you have. Any medical problems you have. Whether you're pregnant or may be pregnant. What are the risks? Your health care provider will talk with you about risks. These may include: Mood changes or depression. Your bones becoming thinner or weaker. This is called loss of bone density. This can happen if you get the shots for a long period of time. This can cause your bones to break. Blood clots. These are rare. A higher risk of an egg being fertilized outside your uterus, called an ectopic pregnancy.This is rare. What happens before? Your provider may do a physical exam. You may have a test to make sure you aren't pregnant. What happens during a birth control shot?  The area where the shot will be given will be cleaned. A needle will be put into a muscle in your upper arm or butt, or into the skin of your thigh or belly. The needle will be put on a syringe with the medicine in it. The medicine will be pushed through the syringe into your body. A small bandage may be put over the place where the shot was given. What happens after? After the shot, it's common to have: Soreness around the place where the shot was given for a couple of days. Spotting or bleeding between periods. Weight gain. Tender  breasts. Headaches. Belly pain. Ask your provider if you need to use an added method of birth control, such as a condom, sponge, or spermicide. If the first shot is given 1-7 days after the start of your last period, you won't need to use an added method of birth control. If the first shot is given at any other time during your menstrual cycle, you'll need to use an added method of birth control for 7 days after you get the shot. Follow these instructions at home: General instructions Take your medicines only as told. Do not rub or massage the place where the shot was given. Track your periods. This will help you know if they become irregular. Always use a condom to protect against sexually transmitted infections (STIs). Make an appointment in time for your next shot and mark it on your calendar. You must get a shot every 3 months (12-13 weeks) to prevent pregnancy. Lifestyle Do not smoke, vape, or use nicotine or tobacco. Eat foods that are high in calcium and vitamin D, such as milk, cheese, and salmon. Calcium helps keep your bones strong and may help with any loss in bone density caused by the birth control shot. Ask your provider if you should take supplements. Contact a health care provider if you: Have discharge or bleeding from your vagina that isn't normal. Miss a period or think you might be pregnant. Have mood changes or depression. Feel dizzy or light-headed. Have leg pain. Get help right away if you: Have chest pain  or cough up blood. Have trouble breathing. Have a really bad headache that doesn't go away. Have numbness in any part of your body. Have really bad pain in your belly. Have slurred speech or vision problems. These symptoms may be an emergency. Call 911 right away. Do not wait to see if the symptoms will go away. Do not drive yourself to the hospital. This information is not intended to replace advice given to you by your health care provider. Make sure you  discuss any questions you have with your health care provider. Document Revised: 01/14/2023 Document Reviewed: 01/14/2023 Elsevier Patient Education  2024 [employer].

## 2023-12-17 NOTE — Progress Notes (Signed)
    NURSE VISIT NOTE  Subjective:    Patient ID: Tiffany Bowen, female    DOB: 09/01/1990, 33 y.o.   MRN: [id number]  HPI  Patient is a 33 y.o. [id number] female who presents for depo provera  injection.   Objective:    BP 108/70   Pulse 67   Ht 5' 6 (1.676 m)   Wt 185 lb 14.4 oz (84.3 kg)   BMI 30.01 kg/m   Last Annual: 09/02/22. Scheduled for 01/25/24. Last pap: 09/02/22. Last Depo-Provera : 09/24/23. Side Effects if any: n/a. Serum HCG indicated? No. Depo-Provera  150 mg IM given by: Waddell Maxim, CMA. Site: Left Deltoid   Assessment:   1. Encounter for Depo-Provera  contraception      Plan:   Next appointment due between 03/03/24 and 03/17/24.    Waddell JONELLE Maxim, CMA

## 2023-12-30 ENCOUNTER — Ambulatory Visit: Admitting: Physician Assistant

## 2023-12-30 ENCOUNTER — Encounter: Payer: Self-pay | Admitting: Physician Assistant

## 2023-12-30 VITALS — BP 100/64 | HR 68 | Temp 100.0°F | Ht 66.0 in | Wt 191.0 lb

## 2023-12-30 DIAGNOSIS — Z0000 Encounter for general adult medical examination without abnormal findings: Secondary | ICD-10-CM | POA: Diagnosis not present

## 2023-12-30 DIAGNOSIS — R7303 Prediabetes: Secondary | ICD-10-CM | POA: Diagnosis not present

## 2023-12-30 DIAGNOSIS — D509 Iron deficiency anemia, unspecified: Secondary | ICD-10-CM | POA: Diagnosis not present

## 2023-12-30 DIAGNOSIS — E785 Hyperlipidemia, unspecified: Secondary | ICD-10-CM

## 2023-12-30 DIAGNOSIS — M67431 Ganglion, right wrist: Secondary | ICD-10-CM | POA: Insufficient documentation

## 2023-12-30 NOTE — Progress Notes (Signed)
 Date:  12/30/2023   Name:  Tiffany Bowen   DOB:  1990-06-04   MRN:  [id number]   Chief Complaint: Anemia (Feels tired) and Annual Exam  HPI Tiffany Bowen presents today for routine physical exam.  She has a history of iron deficiency anemia, but her CBC was normal last year.  She continues with daily multivitamin and iron supplement.  Brings a form for me to fill out regarding completion of physical today.  Last Physical: 1-2y ago  Last Dental Exam: 33m ago  Last Eye Exam: 75m  Last Pap: 1y ago NILM Immunizations Due: none   Medication list has been reviewed and updated.  Current Meds  Medication Sig   Ascorbic Acid (VITAMIN C PO) Take by mouth.   CHLOROPHYLL PO Take by mouth.   Ferrous Sulfate (IRON PO) Take by mouth.   MAGNESIUM PO Take by mouth.   medroxyPROGESTERone  (DEPO-PROVERA ) 150 MG/ML injection Inject 1 mL (150 mg total) into the muscle every 3 (three) months.   Multiple Vitamins-Minerals (ONE-A-DAY WOMENS PO) Take by mouth.   sertraline  (ZOLOFT ) 100 MG tablet Take 1 tablet (100 mg total) by mouth daily.   [DISCONTINUED] sertraline  (ZOLOFT ) 50 MG tablet Take 1 tablet (50 mg total) by mouth daily.     Review of Systems  Patient Active Problem List   Diagnosis Date Noted   Ganglion cyst of dorsum of right wrist 12/30/2023   Prediabetes 10/12/2022   Mild hyperlipidemia 10/12/2022   History of anemia 10/12/2022   Mild anxiety 10/12/2022   History of loop electrical excision procedure (LEEP) 02/08/2020   Abnormal uterine bleeding (AUB) 01/26/2018    No Known Allergies  Immunization History  Administered Date(s) Administered   Influenza,inj,Quad PF,6+ Mos 02/08/2020   Tdap 05/01/2020    Past Surgical History:  Procedure Laterality Date   INTRAUTERINE DEVICE (IUD) INSERTION N/A 07/11/2018   Procedure: INTRAUTERINE DEVICE (IUD) INSERTION Lot # [id number] Exp date Apr 2022;  Surgeon: Janit Alm Agent, MD;  Location: [facility];  Service: Gynecology;  Laterality: N/A;    LEEP N/A 07/11/2018   Procedure: LOOP ELECTROSURGICAL EXCISION PROCEDURE (LEEP);  Surgeon: Janit Alm Agent, MD;  Location: [facility];  Service: Gynecology;  Laterality: N/A;   NO PAST SURGERIES      Social History   Tobacco Use   Smoking status: Never   Smokeless tobacco: Never  Vaping Use   Vaping status: Never Used  Substance Use Topics   Alcohol use: No   Drug use: No    Family History  Problem Relation Age of Onset   Stroke Mother    Diabetes Mother    Hypertension Mother    Hypertension Sister    Diabetes Sister    Cancer Paternal Grandmother    Heart disease Neg Hx         12/30/2023    4:29 PM 01/22/2023    9:07 AM 10/12/2022    3:36 PM 09/02/2022    8:29 AM  GAD 7 : Generalized Anxiety Score  Nervous, Anxious, on Edge 2 1 3 3   Control/stop worrying 2 1 3 3   Worry too much - different things 2 1 3 3   Trouble relaxing 2 1 2 2   Restless 2 1 1 1   Easily annoyed or irritable 2 1 2 3   Afraid - awful might happen 2 1 2 3   Total GAD 7 Score 14 7 16 18   Anxiety Difficulty Somewhat difficult Somewhat difficult Not difficult at all  12/30/2023    4:28 PM 01/22/2023    9:07 AM 10/12/2022    3:35 PM  Depression screen PHQ 2/9  Decreased Interest 2 1 2   Down, Depressed, Hopeless 2 1 1   PHQ - 2 Score 4 2 3   Altered sleeping 2 1 2   Tired, decreased energy 2 1 3   Change in appetite 2 1 2   Feeling bad or failure about yourself  2 0 2  Trouble concentrating 2 0 2  Moving slowly or fidgety/restless 2 1 0  Suicidal thoughts 0 0 0  PHQ-9 Score 16 6 14   Difficult doing work/chores Somewhat difficult Somewhat difficult Not difficult at all    BP Readings from Last 3 Encounters:  12/30/23 100/64  12/17/23 108/70  09/24/23 108/71    Wt Readings from Last 3 Encounters:  12/30/23 191 lb (86.6 kg)  12/17/23 185 lb 14.4 oz (84.3 kg)  09/24/23 186 lb (84.4 kg)    BP 100/64   Pulse 68   Temp 100 F (37.8 C)   Ht 5' 6 (1.676 m)   Wt 191 lb (86.6 kg)   SpO2  97%   BMI 30.83 kg/m   Physical Exam Vitals and nursing note reviewed. Exam conducted with a chaperone present.  Constitutional:      Appearance: Normal appearance. She is well-groomed.  HENT:     Ears:     Comments: EAC clear bilaterally with good view of TM which is without effusion or erythema.     Nose: Nose normal.     Mouth/Throat:     Mouth: Mucous membranes are moist. No oral lesions.     Dentition: Normal dentition.     Pharynx: Uvula midline. No posterior oropharyngeal erythema.  Eyes:     General: Vision grossly intact.     Extraocular Movements: Extraocular movements intact.     Conjunctiva/sclera: Conjunctivae normal.     Pupils: Pupils are equal, round, and reactive to light.  Neck:     Thyroid: No thyroid mass or thyromegaly.  Cardiovascular:     Rate and Rhythm: Normal rate and regular rhythm.     Heart sounds: S1 normal and S2 normal. No murmur heard.    No friction rub. No gallop.     Comments: Pulses 2+ at radial, PT, DP bilaterally. No carotid bruit. No peripheral edema Pulmonary:     Effort: Pulmonary effort is normal.     Breath sounds: Normal breath sounds.  Chest:     Comments: Deferred Abdominal:     General: Bowel sounds are normal. There is no distension.     Palpations: Abdomen is soft. There is no mass.     Tenderness: There is no abdominal tenderness.  Genitourinary:    Comments: Deferred Musculoskeletal:        General: Normal range of motion.     Comments: Full ROM with strength 5/5 bilateral upper and lower extremities. 1 cm nontender mobile cyst at the right dorsal wrist without overlying skin changes. Full ROM at the wrist without pain.    Lymphadenopathy:     Cervical: No cervical adenopathy.  Skin:    General: Skin is warm and dry.     Capillary Refill: Capillary refill takes less than 2 seconds.     Findings: No lesion or rash.  Neurological:     Mental Status: She is alert and oriented to person, place, and time.     Cranial  Nerves: Cranial nerves 2-12 are intact.     Gait:  Gait is intact.  Psychiatric:        Mood and Affect: Mood and affect normal.        Behavior: Behavior normal.     Recent Labs     Component Value Date/Time   NA 140 09/02/2022 0911   NA 134 (L) 11/01/2013 1651   K 4.2 09/02/2022 0911   K 3.5 11/01/2013 1651   CL 104 09/02/2022 0911   CL 107 11/01/2013 1651   CO2 22 09/02/2022 0911   CO2 23 11/01/2013 1651   GLUCOSE 90 09/02/2022 0911   GLUCOSE 91 12/14/2019 1035   GLUCOSE 89 11/01/2013 1651   BUN 8 09/02/2022 0911   BUN 7 11/01/2013 1651   CREATININE 0.85 09/02/2022 0911   CREATININE 0.77 11/01/2013 1651   CALCIUM 9.4 09/02/2022 0911   CALCIUM 9.1 11/01/2013 1651   PROT 7.6 12/14/2019 1035   PROT 7.7 11/01/2013 1651   ALBUMIN 4.0 12/14/2019 1035   ALBUMIN 3.7 11/01/2013 1651   AST 17 12/14/2019 1035   AST 26 11/01/2013 1651   ALT 19 12/14/2019 1035   ALT 27 11/01/2013 1651   ALKPHOS 35 (L) 12/14/2019 1035   ALKPHOS 56 11/01/2013 1651   BILITOT 0.9 12/14/2019 1035   BILITOT 0.4 11/01/2013 1651   GFRNONAA >60 12/14/2019 1035   GFRNONAA >60 11/01/2013 1651   GFRAA >60 12/14/2019 1035   GFRAA >60 11/01/2013 1651    Lab Results  Component Value Date   WBC 7.5 09/02/2022   HGB 12.8 09/02/2022   HCT 40.1 09/02/2022   MCV 87 09/02/2022   PLT 265 09/02/2022   Lab Results  Component Value Date   HGBA1C 5.7 (H) 09/02/2022   Lab Results  Component Value Date   CHOL 201 (H) 09/02/2022   HDL 58 09/02/2022   LDLCALC 133 (H) 09/02/2022   TRIG 57 09/02/2022   CHOLHDL 3.5 09/02/2022   Lab Results  Component Value Date   TSH 0.666 09/02/2022      Assessment and Plan:  1. Annual physical exam (Primary) Encouraged healthy lifestyle including regular physical activity and consumption of whole fruits and vegetables. Encouraged routine dental and eye exams. Vaccinations up to date.   - CBC with Differential/Platelet - Comprehensive metabolic panel with GFR -  TSH  2. Iron deficiency anemia, unspecified iron deficiency anemia type - Iron, TIBC and Ferritin Panel  3. Prediabetes - Hemoglobin A1c  4. Mild hyperlipidemia Will return for fasting lipids - Lipid panel  5. Ganglion cyst of dorsum of right wrist Can use Voltaren to potentially shrink the ganglion cyst as previously discussed    Return in about 1 year (around 12/29/2024) for CPE no pap.    Rolan Hoyle, PA-C, DMSc, Nutritionist [facility] [phone]

## 2023-12-31 LAB — LIPID PANEL
Chol/HDL Ratio: 3.5 ratio (ref 0.0–4.4)
Cholesterol, Total: 191 mg/dL (ref 100–199)
HDL: 54 mg/dL (ref >39)
LDL Chol Calc (NIH): 108 mg/dL — ABNORMAL HIGH (ref 0–99)
Triglycerides: 169 mg/dL — ABNORMAL HIGH (ref 0–149)
VLDL Cholesterol Cal: 29 mg/dL (ref 5–40)

## 2023-12-31 LAB — CBC WITH DIFFERENTIAL/PLATELET
Basophils Absolute: 0.1 x10E3/uL (ref 0.0–0.2)
Basos: 1 %
EOS (ABSOLUTE): 0.4 x10E3/uL (ref 0.0–0.4)
Eos: 5 %
Hematocrit: 39.4 % (ref 34.0–46.6)
Hemoglobin: 12.3 g/dL (ref 11.1–15.9)
Immature Grans (Abs): 0 x10E3/uL (ref 0.0–0.1)
Immature Granulocytes: 0 %
Lymphocytes Absolute: 4.3 x10E3/uL — ABNORMAL HIGH (ref 0.7–3.1)
Lymphs: 45 %
MCH: 28.2 pg (ref 26.6–33.0)
MCHC: 31.2 g/dL — ABNORMAL LOW (ref 31.5–35.7)
MCV: 90 fL (ref 79–97)
Monocytes Absolute: 0.6 x10E3/uL (ref 0.1–0.9)
Monocytes: 6 %
Neutrophils Absolute: 4 x10E3/uL (ref 1.4–7.0)
Neutrophils: 43 %
Platelets: 241 x10E3/uL (ref 150–450)
RBC: 4.36 x10E6/uL (ref 3.77–5.28)
RDW: 12 % (ref 11.7–15.4)
WBC: 9.4 x10E3/uL (ref 3.4–10.8)

## 2023-12-31 LAB — COMPREHENSIVE METABOLIC PANEL WITH GFR
ALT: 42 IU/L — ABNORMAL HIGH (ref 0–32)
AST: 38 IU/L (ref 0–40)
Albumin: 4.5 g/dL (ref 3.9–4.9)
Alkaline Phosphatase: 83 IU/L (ref 44–121)
BUN/Creatinine Ratio: 13 (ref 9–23)
BUN: 12 mg/dL (ref 6–20)
Bilirubin Total: 0.4 mg/dL (ref 0.0–1.2)
CO2: 22 mmol/L (ref 20–29)
Calcium: 9.6 mg/dL (ref 8.7–10.2)
Chloride: 105 mmol/L (ref 96–106)
Creatinine, Ser: 0.91 mg/dL (ref 0.57–1.00)
Globulin, Total: 2.7 g/dL (ref 1.5–4.5)
Glucose: 88 mg/dL (ref 70–99)
Potassium: 4 mmol/L (ref 3.5–5.2)
Sodium: 140 mmol/L (ref 134–144)
Total Protein: 7.2 g/dL (ref 6.0–8.5)
eGFR: 84 mL/min/1.73 (ref >59)

## 2023-12-31 LAB — IRON,TIBC AND FERRITIN PANEL
Ferritin: 234 ng/mL — ABNORMAL HIGH (ref 15–150)
Iron Saturation: 13 % — ABNORMAL LOW (ref 15–55)
Iron: 37 ug/dL (ref 27–159)
Total Iron Binding Capacity: 291 ug/dL (ref 250–450)
UIBC: 254 ug/dL (ref 131–425)

## 2023-12-31 LAB — HEMOGLOBIN A1C
Est. average glucose Bld gHb Est-mCnc: 105 mg/dL
Hgb A1c MFr Bld: 5.3 % (ref 4.8–5.6)

## 2023-12-31 LAB — TSH: TSH: 0.633 u[IU]/mL (ref 0.450–4.500)

## 2024-01-03 ENCOUNTER — Telehealth: Payer: Self-pay

## 2024-01-03 NOTE — Telephone Encounter (Signed)
 Noted  KP

## 2024-01-03 NOTE — Telephone Encounter (Signed)
 Copied from CRM #[id number]. Topic: Clinical - Lab/Test Results >> Jan 03, 2024  1:33 PM Charlet HERO wrote: Reason for CRM: Patient is calling about lab results informed that Dr has not reviewed the results, she would like call back.

## 2024-01-05 ENCOUNTER — Ambulatory Visit: Payer: Self-pay | Admitting: Physician Assistant

## 2024-01-07 NOTE — Telephone Encounter (Signed)
 Please review.  KP

## 2024-01-24 ENCOUNTER — Other Ambulatory Visit: Payer: Self-pay | Admitting: Obstetrics and Gynecology

## 2024-01-24 DIAGNOSIS — F410 Panic disorder [episodic paroxysmal anxiety] without agoraphobia: Secondary | ICD-10-CM

## 2024-01-24 DIAGNOSIS — F419 Anxiety disorder, unspecified: Secondary | ICD-10-CM

## 2024-01-25 ENCOUNTER — Ambulatory Visit: Admitting: Obstetrics & Gynecology

## 2024-01-25 DIAGNOSIS — Z01419 Encounter for gynecological examination (general) (routine) without abnormal findings: Secondary | ICD-10-CM

## 2024-02-21 ENCOUNTER — Ambulatory Visit (INDEPENDENT_AMBULATORY_CARE_PROVIDER_SITE_OTHER): Admitting: Obstetrics & Gynecology

## 2024-02-21 VITALS — BP 99/63 | HR 58 | Ht 66.0 in | Wt 186.1 lb

## 2024-02-21 DIAGNOSIS — Z01419 Encounter for gynecological examination (general) (routine) without abnormal findings: Secondary | ICD-10-CM

## 2024-02-21 NOTE — Progress Notes (Unsigned)
 GYNECOLOGY ANNUAL PHYSICAL EXAM PROGRESS NOTE  Subjective:    Tiffany Bowen is a 33 y.o. single  [phone] (72 and 3 yo kids) who presents for an annual exam.  The patient is sexually active. The patient participates in regular exercise: no. Has the patient ever been transfused or tattooed?: yes. The patient reports that there is not domestic violence in her life.   The patient has the following complaints today:   Menstrual History: Menarche age: *** Patient's last menstrual period was 02/07/2024 (approximate).     Gynecologic History:  Contraception: {method:5051} History of STI's:  Last Pap: ***. Results were: {norm/abn:16337}.  ***Denies/Notes h/o abnormal pap smears. Last mammogram: ***. Results were: {norm/abn:16337}       OB History  Gravida Para Term Preterm AB Living  7 3 2 1 4 2   SAB IAB Ectopic Multiple Live Births  3 1 0 0 2    # Outcome Date GA Lbr Len/2nd Weight Sex Type Anes PTL Lv  7 Term 07/18/20 [redacted]w[redacted]d 09:29 / 00:53 8 lb 5.3 oz (3.78 kg) F Vag-Spont EPI  LIV     Birth Comments: no anomalies noted at delivery     Name: FUMIE, FIALLO     Apgar1: 8  Apgar5: 9  6 SAB 2020          5 SAB 2019          4 IAB 2017          3 Term 05/2014   7 lb 8 oz (3.402 kg) M Vag-Spont   LIV  2 Preterm 2014 [redacted]w[redacted]d   M Vag-Spont   FD  1 SAB 2012        ND    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   cin 2 01/18/2014-    Hyperlipidemia    Post partum depression     Past Surgical History:  Procedure Laterality Date   INTRAUTERINE DEVICE (IUD) INSERTION N/A 07/11/2018   Procedure: INTRAUTERINE DEVICE (IUD) INSERTION Lot # [id number] Exp date Apr 2022;  Surgeon: Janit Alm Agent, MD;  Location: [facility];  Service: Gynecology;  Laterality: N/A;   LEEP N/A 07/11/2018   Procedure: LOOP ELECTROSURGICAL EXCISION PROCEDURE (LEEP);  Surgeon: Janit Alm Agent,  MD;  Location: [facility];  Service: Gynecology;  Laterality: N/A;   NO PAST SURGERIES      Family History  Problem Relation Age of Onset   Stroke Mother    Diabetes Mother    Hypertension Mother    Hypertension Sister    Diabetes Sister    Cancer Paternal Grandmother    Heart disease Neg Hx     Social History   Socioeconomic History   Marital status: Significant Other    Spouse name: Ozell Pouch    Number of children: 2   Years of education: Not on file   Highest education level: Not on file  Occupational History   Not on file  Tobacco Use   Smoking status: Never   Smokeless tobacco: Never  Vaping Use   Vaping status: Never Used  Substance and Sexual Activity   Alcohol use: No   Drug use: No   Sexual activity: Yes    Birth control/protection: Injection  Other Topics Concern   Not on file  Social History Narrative   Not on file   Social Drivers of  Health   Financial Resource Strain: Not on file  Food Insecurity: No Food Insecurity (12/30/2023)   Hunger Vital Sign    Worried About Running Out of Food in the Last Year: Never true    Ran Out of Food in the Last Year: Never true  Transportation Needs: No Transportation Needs (12/30/2023)   PRAPARE - Administrator, Civil Service (Medical): No    Lack of Transportation (Non-Medical): No  Physical Activity: Inactive (01/26/2018)   Exercise Vital Sign    Days of Exercise per Week: 0 days    Minutes of Exercise per Session: 0 min  Stress: Not on file  Social Connections: Not on file  Intimate Partner Violence: Not At Risk (12/30/2023)   Humiliation, Afraid, Rape, and Kick questionnaire    Fear of Current or Ex-Partner: No    Emotionally Abused: No    Physically Abused: No    Sexually Abused: No    Current Outpatient Medications on File Prior to Visit  Medication Sig Dispense Refill   Ascorbic Acid (VITAMIN C PO) Take by mouth.     CHLOROPHYLL PO Take by mouth.     Ferrous Sulfate (IRON PO) Take by  mouth.     MAGNESIUM PO Take by mouth.     medroxyPROGESTERone  (DEPO-PROVERA ) 150 MG/ML injection Inject 1 mL (150 mg total) into the muscle every 3 (three) months. 1 mL 3   Multiple Vitamins-Minerals (ONE-A-DAY WOMENS PO) Take by mouth.     sertraline  (ZOLOFT ) 100 MG tablet Take 1 tablet by mouth once daily 90 tablet 0   No current facility-administered medications on file prior to visit.    No Known Allergies   Review of Systems Constitutional: negative for chills, fatigue, fevers and sweats Eyes: negative for irritation, redness and visual disturbance Ears, nose, mouth, throat, and face: negative for hearing loss, nasal congestion, snoring and tinnitus Respiratory: negative for asthma, cough, sputum Cardiovascular: negative for chest pain, dyspnea, exertional chest pressure/discomfort, irregular heart beat, palpitations and syncope Gastrointestinal: negative for abdominal pain, change in bowel habits, nausea and vomiting Genitourinary: negative for abnormal menstrual periods, genital lesions, sexual problems and vaginal discharge, dysuria and urinary incontinence Integument/breast: negative for breast lump, breast tenderness and nipple discharge Hematologic/lymphatic: negative for bleeding and easy bruising Musculoskeletal:negative for back pain and muscle weakness Neurological: negative for dizziness, headaches, vertigo and weakness Endocrine: negative for diabetic symptoms including polydipsia, polyuria and skin dryness Allergic/Immunologic: negative for hay fever and urticaria      Objective:  Blood pressure 99/63, pulse (!) 58, height 5' 6 (1.676 m), weight 186 lb 1.6 oz (84.4 kg), last menstrual period 02/07/2024. Body mass index is 30.04 kg/m.    General Appearance:    Alert, cooperative, no distress, appears stated age  Head:    Normocephalic, without obvious abnormality, atraumatic  Eyes:    PERRL, conjunctiva/corneas clear, EOM's intact, both eyes  Ears:    Normal  external ear canals, both ears  Nose:   Nares normal, septum midline, mucosa normal, no drainage or sinus tenderness  Throat:   Lips, mucosa, and tongue normal; teeth and gums normal  Neck:   Supple, symmetrical, trachea midline, no adenopathy; thyroid: no enlargement/tenderness/nodules; no carotid bruit or JVD  Back:     Symmetric, no curvature, ROM normal, no CVA tenderness  Lungs:     Clear to auscultation bilaterally, respirations unlabored  Chest Wall:    No tenderness or deformity   Heart:    Regular rate and rhythm,  S1 and S2 normal, no murmur, rub or gallop  Breast Exam:    No tenderness, masses, or nipple abnormality  Abdomen:     Soft, non-tender, bowel sounds active all four quadrants, no masses, no organomegaly.    Genitalia:    Pelvic:external genitalia normal, vagina without lesions, discharge, or tenderness, rectovaginal septum  normal. Cervix normal in appearance, no cervical motion tenderness, no adnexal masses or tenderness.  Uterus normal size, shape, mobile, regular contours, nontender.  Rectal:    Normal external sphincter.  No hemorrhoids appreciated. Internal exam not done.   Extremities:   Extremities normal, atraumatic, no cyanosis or edema  Pulses:   2+ and symmetric all extremities  Skin:   Skin color, texture, turgor normal, no rashes or lesions  Lymph nodes:   Cervical, supraclavicular, and axillary nodes normal  Neurologic:   CNII-XII intact, normal strength, sensation and reflexes throughout   .  Labs:  Lab Results  Component Value Date   WBC 9.4 12/30/2023   HGB 12.3 12/30/2023   HCT 39.4 12/30/2023   MCV 90 12/30/2023   PLT 241 12/30/2023    Lab Results  Component Value Date   CREATININE 0.91 12/30/2023   BUN 12 12/30/2023   NA 140 12/30/2023   K 4.0 12/30/2023   CL 105 12/30/2023   CO2 22 12/30/2023    Lab Results  Component Value Date   ALT 42 (H) 12/30/2023   AST 38 12/30/2023   ALKPHOS 83 12/30/2023   BILITOT 0.4 12/30/2023    Lab  Results  Component Value Date   TSH 0.633 12/30/2023     Assessment:   1. Well woman exam with routine gynecological exam   2. Cervical cancer screening      Plan:  Blood tests: {blood tests:13147}. Breast self exam technique reviewed and patient encouraged to perform self-exam monthly. Contraception: {contraceptive methods:5051}. Discussed healthy lifestyle modifications. Mammogram {discussed/ordered:14545} Pap smear {discussed/ordered:14545}. Flu vaccine: Follow up in 1 year for annual exam   Starla Harland BROCKS, MD [facility]

## 2024-03-10 ENCOUNTER — Ambulatory Visit

## 2024-03-10 VITALS — BP 109/68 | HR 60 | Ht 66.0 in | Wt 187.4 lb

## 2024-03-10 DIAGNOSIS — Z3042 Encounter for surveillance of injectable contraceptive: Secondary | ICD-10-CM

## 2024-03-10 MED ORDER — MEDROXYPROGESTERONE ACETATE 150 MG/ML IM SUSP
150.0000 mg | Freq: Once | INTRAMUSCULAR | Status: AC
  Administered 2024-03-10: 150 mg via INTRAMUSCULAR

## 2024-03-10 NOTE — Progress Notes (Signed)
    NURSE VISIT NOTE  Subjective:    Patient ID: Tiffany Bowen, female    DOB: [birth year], 33 y.o.   MRN: [id number]  HPI  Patient is a 33 y.o. [id number] female who presents for depo provera  injection.   Objective:    Ht 5' 6 (1.676 m)   LMP 02/07/2024 (Approximate)   BMI 30.04 kg/m   Last Annual: 02/21/2024. Last pap: 09/02/2022. Last Depo-Provera : 12/17/2023. Side Effects if any: none. Serum HCG indicated? No . Depo-Provera  150 mg IM given by: Camelia Fetters, CMA. Site: Right Deltoid  Lab Review  No results found for any visits on 03/10/24.  Assessment:   1. Encounter for surveillance of injectable contraceptive      Plan:   Next appointment due between Jan. 2 and Jan. 16     Camelia Fetters, CMA [facility]

## 2024-03-10 NOTE — Patient Instructions (Signed)

## 2024-03-28 ENCOUNTER — Ambulatory Visit: Admitting: Obstetrics & Gynecology

## 2024-06-02 ENCOUNTER — Ambulatory Visit

## 2024-06-02 VITALS — BP 109/63 | HR 58 | Resp 16 | Ht 66.0 in | Wt 191.7 lb

## 2024-06-02 DIAGNOSIS — Z3042 Encounter for surveillance of injectable contraceptive: Secondary | ICD-10-CM | POA: Diagnosis not present

## 2024-06-02 MED ORDER — MEDROXYPROGESTERONE ACETATE 150 MG/ML IM SUSP
150.0000 mg | Freq: Once | INTRAMUSCULAR | Status: AC
  Administered 2024-06-02: 150 mg via INTRAMUSCULAR

## 2024-06-02 NOTE — Progress Notes (Signed)
" ° ° °  NURSE VISIT NOTE  Subjective:    Patient ID: Tiffany Bowen, female    DOB: 24-Jan-1991, 34 y.o.   MRN: [id number]  HPI  Patient is a 34 y.o. [id number] female who presents for depo provera  injection.   Objective:    Ht 5' 6 (1.676 m)   BMI 30.25 kg/m   Last Annual: 02/21/2024. Last pap: 09/02/2022 Last Depo-Provera : 03/10/2024. Side Effects if any: none. Serum HCG indicated? No . Depo-Provera  150 mg IM given by: Camelia Fetters, CMA. Site: Left Deltoid  Lab Review  No results found for any visits on 06/02/24.  Assessment:   1. Encounter for surveillance of injectable contraceptive      Plan:   Next appointment due between March 27 and April 10.    Camelia Fetters, CMA [facility]

## 2024-06-02 NOTE — Patient Instructions (Signed)

## 2024-06-08 NOTE — Progress Notes (Unsigned)
" ° ° °  GYNECOLOGY PROGRESS NOTE  Subjective:    Patient ID: Tiffany Bowen, female    DOB: 1991-02-19, 34 y.o.   MRN: [id number]  HPI  Patient is a 34 y.o. [id number] female who presents for evaluation for anxiety. She has been taking Zoloft  100 mg daily to treat symptoms.   {Common ambulatory SmartLinks:19316}  Review of Systems {ros; complete:30496}   Objective:   There were no vitals taken for this visit. There is no height or weight on file to calculate BMI. General appearance: {general exam:16600} Abdomen: {abdominal exam:16834} Pelvic: {pelvic exam:16852::cervix normal in appearance,external genitalia normal,no adnexal masses or tenderness,no cervical motion tenderness,rectovaginal septum normal,uterus normal size, shape, and consistency,vagina normal without discharge} Extremities: {extremity exam:5109} Neurologic: {neuro exam:17854}   Assessment:   1. Anxiety   2. Panic attacks      Plan:   1. Anxiety ***  2. Panic attacks ***     Damien Parsley, CNM [facility] [place]  "

## 2024-06-08 NOTE — Patient Instructions (Incomplete)
 Managing Anxiety, Adult  After being diagnosed with anxiety, you may be relieved to know why you have felt or behaved a certain way. You may also feel overwhelmed about the treatment ahead and what it will mean for your life. With care and support, you can manage your anxiety.  How to manage lifestyle changes  Understanding the difference between stress and anxiety  Although stress can play a role in anxiety, it is not the same as anxiety. Stress is your body's reaction to life changes and events, both good and bad. Stress is often caused by something external, such as a deadline, test, or competition. It normally goes away after the event has ended and will last just a few hours. But, stress can be ongoing and can lead to more than just stress.  Anxiety is caused by something internal, such as imagining a terrible outcome or worrying that something will go wrong that will greatly upset you. Anxiety often does not go away even after the event is over, and it can become a long-term (chronic) worry.  Lowering stress and anxiety    Talk with your health care provider or a counselor to learn more about lowering anxiety and stress. They may suggest tension-reduction techniques, such as:  Music. Spend time creating or listening to music that you enjoy and that inspires you.  Mindfulness-based meditation. Practice being aware of your normal breaths while not trying to control your breathing. It can be done while sitting or walking.  Centering prayer. Focus on a word, phrase, or sacred image that means something to you and brings you peace.  Deep breathing. Expand your stomach and inhale slowly through your nose. Hold your breath for 3-5 seconds. Then breathe out slowly, letting your stomach muscles relax.  Self-talk. Learn to notice and spot thought patterns that lead to anxiety reactions. Change those patterns to thoughts that feel peaceful.  Muscle relaxation. Take time to tense muscles and then relax them.  Choose a  tension-reduction technique that fits your lifestyle and personality. These techniques take time and practice. Set aside 5-15 minutes a day to do them. Specialized therapists can offer counseling and training in these techniques. The training to help with anxiety may be covered by some insurance plans.  Other things you can do to manage stress and anxiety include:  Keeping a stress diary. This can help you learn what triggers your reaction and then learn ways to manage your response.  Thinking about how you react to certain situations. You may not be able to control everything, but you can control your response.  Making time for activities that help you relax and not feeling guilty about spending your time in this way.  Doing visual imagery. This involves imagining or creating mental pictures to help you relax.  Practicing yoga. Through yoga poses, you can lower tension and relax.     Medicines  Medicines for anxiety include:  Antidepressant medicines. These are usually prescribed for long-term daily control.  Anti-anxiety medicines. These may be added in severe cases, especially when panic attacks occur.  When used together, medicines, psychotherapy, and tension-reduction techniques may be the most effective treatment.  Relationships  Relationships can play a big part in helping you recover. Spend more time connecting with trusted friends and family members. Think about going to couples counseling if you have a partner, taking family education classes, or going to family therapy. Therapy can help you and others better understand your anxiety.  How to recognize changes in  your anxiety  Everyone responds differently to treatment for anxiety. Recovery from anxiety happens when symptoms lessen and stop interfering with your daily life at home or work. This may mean that you will start to:  Have better concentration and focus. Worry will interfere less in your daily thinking.  Sleep better.  Be less irritable.  Have  more energy.  Have improved memory.  Try to recognize when your condition is getting worse. Contact your provider if your symptoms interfere with home or work and you feel like your condition is not improving.  Follow these instructions at home:  Activity  Exercise. Adults should:  Exercise for at least 150 minutes each week. The exercise should increase your heart rate and make you sweat (moderate-intensity exercise).  Do strengthening exercises at least twice a week.  Get the right amount and quality of sleep. Most adults need 7-9 hours of sleep each night.  Lifestyle    Eat a healthy diet that includes plenty of vegetables, fruits, whole grains, low-fat dairy products, and lean protein.  Do not eat a lot of foods that are high in fats, added sugars, or salt (sodium).  Make choices that simplify your life.  Do not use any products that contain nicotine or tobacco. These products include cigarettes, chewing tobacco, and vaping devices, such as e-cigarettes. If you need help quitting, ask your provider.  Avoid caffeine, alcohol, and certain over-the-counter cold medicines. These may make you feel worse. Ask your pharmacist which medicines to avoid.  General instructions  Take over-the-counter and prescription medicines only as told by your provider.  Keep all follow-up visits. This is to make sure you are managing your anxiety well or if you need more support.  Where to find support  You can get help and support from:  Self-help groups.  Online and [employer].  A trusted spiritual leader.  Couples counseling.  Family education classes.  Family therapy.  Where to find more information  You may find that joining a support group helps you deal with your anxiety. The following sources can help you find counselors or support groups near you:  Mental Health America: mentalhealthamerica.net  Anxiety and Depression Association of [place] (ADAA): adaa.org  [employer] Mental [employer]ness (NAMI):  nami.org  Contact a health care provider if:  You have a hard time staying focused or finishing tasks.  You spend many hours a day feeling worried about everyday life.  You are very tired because you cannot stop worrying.  You start to have headaches or often feel tense.  You have chronic nausea or diarrhea.  Get help right away if:  Your heart feels like it is racing.  You have shortness of breath.  You have thoughts of hurting yourself or others.  Get help right away if you feel like you may hurt yourself or others, or have thoughts about taking your own life. Go to your nearest emergency room or:  Call 911.  Call the National Suicide Prevention Lifeline at (562)309-0957 or 988. This is open 24 hours a day.  Text the Crisis Text Line at 786-142-3811.  This information is not intended to replace advice given to you by your health care provider. Make sure you discuss any questions you have with your health care provider.  Document Revised: 02/17/2022 Document Reviewed: 09/01/2020  Elsevier Patient Education  2024 [employer].

## 2024-06-09 ENCOUNTER — Ambulatory Visit: Admitting: Certified Nurse Midwife

## 2024-06-09 DIAGNOSIS — F419 Anxiety disorder, unspecified: Secondary | ICD-10-CM

## 2024-06-09 DIAGNOSIS — F410 Panic disorder [episodic paroxysmal anxiety] without agoraphobia: Secondary | ICD-10-CM

## 2024-06-14 ENCOUNTER — Ambulatory Visit
Admission: EM | Admit: 2024-06-14 | Discharge: 2024-06-14 | Disposition: A | Discharge status: Home / Self Care | Attending: Family Medicine | Admitting: Family Medicine

## 2024-06-14 ENCOUNTER — Ambulatory Visit

## 2024-06-14 DIAGNOSIS — M79661 Pain in right lower leg: Secondary | ICD-10-CM | POA: Insufficient documentation

## 2024-06-14 DIAGNOSIS — S8011XA Contusion of right lower leg, initial encounter: Secondary | ICD-10-CM | POA: Diagnosis not present

## 2024-06-14 DIAGNOSIS — T24201A Burn of second degree of unspecified site of right lower limb, except ankle and foot, initial encounter: Secondary | ICD-10-CM | POA: Insufficient documentation

## 2024-06-14 MED ORDER — OXYCODONE HCL 5 MG PO TABS: 5.0000 mg | ORAL_TABLET | Freq: Four times a day (QID) | ORAL | 0 refills | Status: DC | PRN

## 2024-06-14 MED ORDER — SILVER SULFADIAZINE 1 % EX CREA: 1.0000 | TOPICAL_CREAM | Freq: Every day | CUTANEOUS | 0 refills | Status: DC
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
## 2024-06-14 NOTE — Discharge Instructions (Addendum)
 Your ultrasound didn't show a enlarging hematoma on your lower leg.  Use resting, icing, applying pressure (compression), and raising (elevating) the injured area. This is often called the RICE method. Rest the injured area. Return to your normal activities as told by your health care provider. Ask your health care provider what activities are safe for you. If directed, put ice on the injured area. To do this: Put ice in a plastic bag. Place a towel between your skin and the bag. Leave the ice on for 20 minutes, 2-3 times a day. If your skin turns bright red, remove the ice right away to prevent skin damage. The risk of skin damage is higher if you cannot feel pain, heat, or cold. If directed, apply light compression to the injured area using an elastic bandage. Make sure the bandage is not wrapped too tightly. Remove and reapply the bandage as directed by your health care provider. If possible, elevate the injured area above the level of your heart while you are sitting or lying down.  Wound Care:  Once a day wash wounds with warm water and a mild soap (such as Dove or Ivory soap). Soak off bandages if they are sticking with water in the shower or under running water. Do not soak in the bathtub. Remove all creams, ointments and loose skin from your wounds by rubbing gently with a soft, clean washcloth and soap. Lather the washcloth with lots of soap and rub in small circular movements. Rinse thoroughly. Use a clean towel to pat dry.   Silvadene : Once daily apply Silvadene  cream (silver  sulfadiazine ) to the open wounds. Apply the silvadene  in a thick layer, like you are icing a cake. Wrap with gauze and secure with tape or stretchnet.   Patient will watch for signs and symptoms of infection as discussed. See handout on burn care. Stop by the pharmacy to pick up you burn treatment cream.     Call the [facility] for follow up Address: [address] [place], [place] [address] Phone: [phone]
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
## 2024-06-27 ENCOUNTER — Ambulatory Visit: Admitting: Certified Nurse Midwife

## 2024-06-27 ENCOUNTER — Encounter: Payer: Self-pay | Admitting: Certified Nurse Midwife

## 2024-06-27 VITALS — BP 112/72 | HR 63 | Resp 16 | Ht 66.0 in | Wt 193.1 lb

## 2024-06-27 DIAGNOSIS — Z124 Encounter for screening for malignant neoplasm of cervix: Secondary | ICD-10-CM

## 2024-06-27 DIAGNOSIS — F419 Anxiety disorder, unspecified: Secondary | ICD-10-CM

## 2024-06-27 DIAGNOSIS — Z113 Encounter for screening for infections with a predominantly sexual mode of transmission: Secondary | ICD-10-CM

## 2024-06-27 DIAGNOSIS — Z01419 Encounter for gynecological examination (general) (routine) without abnormal findings: Secondary | ICD-10-CM

## 2024-06-27 DIAGNOSIS — F410 Panic disorder [episodic paroxysmal anxiety] without agoraphobia: Secondary | ICD-10-CM

## 2024-06-27 MED ORDER — SERTRALINE HCL 100 MG PO TABS: 150.0000 mg | ORAL_TABLET | Freq: Every day | ORAL | 3 refills | Status: AC

## 2024-06-28 LAB — SYPHILIS: RPR W/REFLEX TO RPR TITER AND TREPONEMAL ANTIBODIES, TRADITIONAL SCREENING AND DIAGNOSIS ALGORITHM: RPR Ser Ql: NONREACTIVE

## 2024-06-28 LAB — HIV ANTIBODY (ROUTINE TESTING W REFLEX): HIV Screen 4th Generation wRfx: NONREACTIVE

## 2024-06-29 LAB — CYTOLOGY - PAP
Comment: NEGATIVE
Diagnosis: NEGATIVE
High risk HPV: NEGATIVE

## 2024-06-29 LAB — CERVICOVAGINAL ANCILLARY ONLY
Bacterial Vaginitis (gardnerella): POSITIVE — AB
Candida Glabrata: NEGATIVE
Candida Vaginitis: NEGATIVE
Chlamydia: NEGATIVE
Comment: NEGATIVE
Comment: NEGATIVE
Comment: NEGATIVE
Comment: NEGATIVE
Comment: NEGATIVE
Comment: NORMAL
Neisseria Gonorrhea: NEGATIVE
Trichomonas: NEGATIVE

## 2024-08-25 ENCOUNTER — Ambulatory Visit

## 2025-01-03 ENCOUNTER — Encounter: Admitting: Physician Assistant
